# Patient Record
Sex: Female | Born: 1950
Health system: Southern US, Community
[De-identification: ages and names within clinical notes are randomized; demographics above are authoritative.]

## PROBLEM LIST (undated history)

## (undated) DIAGNOSIS — M1990 Unspecified osteoarthritis, unspecified site: Secondary | ICD-10-CM

## (undated) DIAGNOSIS — D649 Anemia, unspecified: Secondary | ICD-10-CM

## (undated) DIAGNOSIS — M5030 Other cervical disc degeneration, unspecified cervical region: Secondary | ICD-10-CM

## (undated) DIAGNOSIS — E119 Type 2 diabetes mellitus without complications: Secondary | ICD-10-CM

## (undated) DIAGNOSIS — Z87898 Personal history of other specified conditions: Secondary | ICD-10-CM

## (undated) DIAGNOSIS — Z9889 Other specified postprocedural states: Secondary | ICD-10-CM

## (undated) DIAGNOSIS — E669 Obesity, unspecified: Secondary | ICD-10-CM

## (undated) DIAGNOSIS — Z9289 Personal history of other medical treatment: Secondary | ICD-10-CM

## (undated) DIAGNOSIS — J309 Allergic rhinitis, unspecified: Secondary | ICD-10-CM

## (undated) DIAGNOSIS — K317 Polyp of stomach and duodenum: Secondary | ICD-10-CM

## (undated) DIAGNOSIS — K589 Irritable bowel syndrome without diarrhea: Secondary | ICD-10-CM

## (undated) DIAGNOSIS — E785 Hyperlipidemia, unspecified: Secondary | ICD-10-CM

## (undated) DIAGNOSIS — Z780 Asymptomatic menopausal state: Secondary | ICD-10-CM

## (undated) DIAGNOSIS — M4820 Kissing spine, site unspecified: Secondary | ICD-10-CM

## (undated) DIAGNOSIS — R011 Cardiac murmur, unspecified: Secondary | ICD-10-CM

## (undated) DIAGNOSIS — A491 Streptococcal infection, unspecified site: Secondary | ICD-10-CM

## (undated) DIAGNOSIS — R197 Diarrhea, unspecified: Secondary | ICD-10-CM

## (undated) DIAGNOSIS — I10 Essential (primary) hypertension: Secondary | ICD-10-CM

## (undated) DIAGNOSIS — E039 Hypothyroidism, unspecified: Secondary | ICD-10-CM

## (undated) DIAGNOSIS — R112 Nausea with vomiting, unspecified: Secondary | ICD-10-CM

## (undated) HISTORY — DX: Polyp of stomach and duodenum: K31.7

## (undated) HISTORY — DX: Irritable bowel syndrome, unspecified: K58.9

## (undated) HISTORY — DX: Unspecified osteoarthritis, unspecified site: M19.90

## (undated) HISTORY — PX: COLONOSCOPY: SHX174

## (undated) HISTORY — DX: Essential (primary) hypertension: I10

## (undated) HISTORY — DX: Other cervical disc degeneration, unspecified cervical region: M50.30

## (undated) HISTORY — DX: Anemia, unspecified: D64.9

## (undated) HISTORY — DX: Asymptomatic menopausal state: Z78.0

## (undated) HISTORY — DX: Hypothyroidism, unspecified: E03.9

## (undated) HISTORY — DX: Type 2 diabetes mellitus without complications: E11.9

## (undated) HISTORY — PX: CHOLECYSTECTOMY: SHX55

## (undated) HISTORY — DX: Kissing spine, site unspecified: M48.20

## (undated) HISTORY — DX: Streptococcal infection, unspecified site: A49.1

## (undated) HISTORY — DX: Allergic rhinitis, unspecified: J30.9

## (undated) HISTORY — DX: Obesity, unspecified: E66.9

## (undated) HISTORY — DX: Hyperlipidemia, unspecified: E78.5

## (undated) HISTORY — DX: Personal history of other medical treatment: Z92.89

## (undated) HISTORY — DX: Diarrhea, unspecified: R19.7

## (undated) HISTORY — DX: Personal history of other specified conditions: Z87.898

---

## 1999-11-28 ENCOUNTER — Other Ambulatory Visit: Admission: RE | Admit: 1999-11-28 | Discharge: 1999-11-28 | Payer: Self-pay | Admitting: *Deleted

## 2000-12-24 ENCOUNTER — Other Ambulatory Visit: Admission: RE | Admit: 2000-12-24 | Discharge: 2000-12-24 | Payer: Self-pay | Admitting: Family Medicine

## 2002-01-04 ENCOUNTER — Other Ambulatory Visit: Admission: RE | Admit: 2002-01-04 | Discharge: 2002-01-04 | Payer: Self-pay | Admitting: Family Medicine

## 2002-07-15 ENCOUNTER — Encounter (INDEPENDENT_AMBULATORY_CARE_PROVIDER_SITE_OTHER): Payer: Self-pay | Admitting: Specialist

## 2002-07-15 ENCOUNTER — Ambulatory Visit (HOSPITAL_COMMUNITY): Admission: RE | Admit: 2002-07-15 | Discharge: 2002-07-15 | Payer: Self-pay | Admitting: Gastroenterology

## 2002-11-15 ENCOUNTER — Encounter: Admission: RE | Admit: 2002-11-15 | Discharge: 2002-11-15 | Payer: Self-pay | Admitting: Family Medicine

## 2003-03-28 ENCOUNTER — Encounter: Admission: RE | Admit: 2003-03-28 | Discharge: 2003-03-28 | Payer: Self-pay | Admitting: Family Medicine

## 2003-03-28 ENCOUNTER — Encounter: Payer: Self-pay | Admitting: Family Medicine

## 2004-02-28 ENCOUNTER — Other Ambulatory Visit: Admission: RE | Admit: 2004-02-28 | Discharge: 2004-02-28 | Payer: Self-pay | Admitting: Obstetrics and Gynecology

## 2004-10-25 ENCOUNTER — Ambulatory Visit (HOSPITAL_COMMUNITY): Admission: RE | Admit: 2004-10-25 | Discharge: 2004-10-25 | Payer: Self-pay | Admitting: Gastroenterology

## 2004-10-25 ENCOUNTER — Encounter (INDEPENDENT_AMBULATORY_CARE_PROVIDER_SITE_OTHER): Payer: Self-pay | Admitting: Specialist

## 2005-03-20 ENCOUNTER — Other Ambulatory Visit: Admission: RE | Admit: 2005-03-20 | Discharge: 2005-03-20 | Payer: Self-pay | Admitting: Family Medicine

## 2006-06-10 ENCOUNTER — Other Ambulatory Visit: Admission: RE | Admit: 2006-06-10 | Discharge: 2006-06-10 | Payer: Self-pay | Admitting: Family Medicine

## 2008-02-14 ENCOUNTER — Encounter: Admission: RE | Admit: 2008-02-14 | Discharge: 2008-02-14 | Payer: Self-pay | Admitting: Neurosurgery

## 2009-05-03 ENCOUNTER — Other Ambulatory Visit: Admission: RE | Admit: 2009-05-03 | Discharge: 2009-05-03 | Payer: Self-pay | Admitting: Family Medicine

## 2010-08-06 ENCOUNTER — Encounter: Admission: RE | Admit: 2010-08-06 | Discharge: 2010-08-06 | Payer: Self-pay | Admitting: Sports Medicine

## 2011-01-17 NOTE — Op Note (Signed)
Sheri [name], LANDOWSKI           ACCOUNT NO.:  [id number]   MEDICAL RECORD NO.:  [id number]          PATIENT TYPE:  AMB   LOCATION:  ENDO                         FACILITY:  [facility]   PHYSICIAN:  Bernette Redbird, M.D.   DATE OF BIRTH:  04-24-1951   DATE OF PROCEDURE:  10/25/2004  DATE OF DISCHARGE:                                 OPERATIVE REPORT   PROCEDURE:  Colonoscopy.   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 No risk factors or symptoms.   FINDINGS:  Normal exam to the cecum.   PROCEDURE:  The nature, purpose and risks of the procedure had been  discussed with the patient who provided written consent. Sedation was  fentanyl 125 mcg and Versed 12 mg IV without arrhythmias or desaturation.  The Olympus adjustable tension pediatric video colonoscope was advanced to  the cecum as identified by clear visualization of the appendiceal orifice  and the absence of further lumen. We did have to turn the patient into the  supine position and apply some external abdominal compression to reach the  cecum. Pullback was then performed. The quality of prep was very good and it  is felt that all areas were well seen.   This was a normal examination. No polyps, cancer, colitis, vascular  malformations or diverticulosis were noted and retroflexion in the rectum as  well as reinspection of the rectum were unremarkable. No biopsies were  obtained. The patient tolerated the procedure well and there were no  apparent complications.   IMPRESSION:  Normal screening colonoscopy in a standard risk individual  (V76.51).   PLAN:  Consider flexible sigmoidoscopy in 5 years for continued screening.      RB/MEDQ  D:  10/25/2004  T:  10/25/2004  Job:  [id number]   cc:   Dario Guardian, M.D.  [address]  [place]  [place] [address]  Fax: [phone]

## 2011-01-17 NOTE — Op Note (Signed)
   NAME:  Sheri [name], Sheri [name]                     ACCOUNT NO.:  [id number]   MEDICAL RECORD NO.:  [id number]                   PATIENT TYPE:  AMB   LOCATION:  ENDO                                 [facility]ACILITY[facility]  [facility]   PHYSICIAN:  Bernette Redbird, M.D.                DATE OF BIRTH:  [birth year]   DATE OF PROCEDURE:  07/15/2002  DATE OF DISCHARGE:                                 OPERATIVE REPORT   PROCEDURE:  Upper endoscopy with biopsy.   FINDINGS:  Essentially normal exam.   DESCRIPTION OF PROCEDURE:  The nature, purpose, and risks of the procedure  had been discussed with the patient who provided written consent.  Sedation  was fentanyl 100 mcg and Versed 8 mg IV without arrhythmias or desaturation.  The Olympus video endoscope was passed under direct vision.  The vocal cords  looked normal.  The esophagus was readily entered and had entirely normal  mucosa without evidence of free reflux, reflux esophagitis, varices,  infection, or neoplasia.  There was a spontaneously-reducing or intermittent  1 cm hiatal hernia, but I did not see evidence of Barrett's esophagus or any  ring or stricture.   The stomach contained no significant residual and had normal mucosa,  including a retroflexed view of the cardia and fundus.  Specifically, no  gastritis, erosions, ulcers, polyps, or masses were seen.   The pylorus, duodenal bulb, and the second duodenum looked normal.   The scope was removed after obtaining some random duodenal biopsies.  The  patient tolerated this procedure well, and there were no apparent  complications.   IMPRESSION:  Normal endoscopy.  No source of abdominal pain evident.   PLAN:  1. Continue high dose Protonix which, as of today, has been associated with     some improvement in the abdominal pain.  2. Follow-up in the office in the near future.                                               Bernette Redbird, M.D.    RB/MEDQ  D:  07/15/2002  T:  07/15/2002  Job:   [id number]   cc:   Dario Guardian, M.D.

## 2011-01-17 NOTE — Op Note (Signed)
[name], Sheri [name]           ACCOUNT NO.:  [id number]   MEDICAL RECORD NO.:  [id number]          PATIENT TYPE:  AMB   LOCATION:  ENDO                         FACILITY:  [facility]   PHYSICIAN:  Bernette Redbird, M.D.   DATE OF BIRTH:  07-23-1951   DATE OF PROCEDURE:  10/25/2004  DATE OF DISCHARGE:                                 OPERATIVE REPORT   PROCEDURE:  Colonoscopy with biopsy (addendum).   This is an addendum to previously-dictated  procedure report on this patient  from today. In that report, I neglected to mention that there was a small  sessile polyp in the left colon removed by cold biopsy technique and sent  for histologic analysis.   PLAN:  Await pathology results with follow-up colonoscopy in 5 years if the  polyp is adenomatous in character.      RB/MEDQ  D:  10/25/2004  T:  10/25/2004  Job:  [id number]

## 2011-06-02 DIAGNOSIS — D649 Anemia, unspecified: Secondary | ICD-10-CM

## 2011-06-02 HISTORY — DX: Anemia, unspecified: D64.9

## 2011-06-25 ENCOUNTER — Encounter: Payer: Self-pay | Admitting: Oncology

## 2011-07-29 ENCOUNTER — Other Ambulatory Visit: Payer: Self-pay | Admitting: Lab

## 2011-08-12 ENCOUNTER — Ambulatory Visit: Payer: Self-pay

## 2011-08-13 ENCOUNTER — Ambulatory Visit (HOSPITAL_BASED_OUTPATIENT_CLINIC_OR_DEPARTMENT_OTHER): Payer: PRIVATE HEALTH INSURANCE | Admitting: Oncology

## 2011-08-13 ENCOUNTER — Ambulatory Visit: Payer: PRIVATE HEALTH INSURANCE

## 2011-08-13 ENCOUNTER — Telehealth: Payer: Self-pay | Admitting: *Deleted

## 2011-08-13 ENCOUNTER — Other Ambulatory Visit (HOSPITAL_BASED_OUTPATIENT_CLINIC_OR_DEPARTMENT_OTHER): Payer: PRIVATE HEALTH INSURANCE | Admitting: Lab

## 2011-08-13 VITALS — BP 152/85 | HR 83 | Temp 98.1°F | Ht 64.0 in | Wt 214.8 lb

## 2011-08-13 DIAGNOSIS — K2970 Gastritis, unspecified, without bleeding: Secondary | ICD-10-CM

## 2011-08-13 DIAGNOSIS — D649 Anemia, unspecified: Secondary | ICD-10-CM

## 2011-08-13 DIAGNOSIS — D509 Iron deficiency anemia, unspecified: Secondary | ICD-10-CM

## 2011-08-13 DIAGNOSIS — M25569 Pain in unspecified knee: Secondary | ICD-10-CM

## 2011-08-13 DIAGNOSIS — R5381 Other malaise: Secondary | ICD-10-CM

## 2011-08-13 NOTE — Telephone Encounter (Signed)
gave patient  appointment for 09-2011 and 10-2011 printed out calendar and gave to the patient 

## 2011-08-13 NOTE — Progress Notes (Signed)
CC: Sheri [name]    HPI: Sheri [name] is a 60 year old [place] woman referred by Sheri [name] for evaluation of iron deficiency anemia.  The patient has felt unusually tired for about a year and a half. She does have a history of hypothyroidism, and that was evaluated and found not to be the cause. The patient's daughter was recently seen in [place] by the hematologist there, Sheri [name], and received iron dextran. That made a big difference to her daughter's functional status. Sheri [name] wonders whether she might receive an equal benefit. At the same time Sheri [name] is concerned that if there is iron deficiency, in this postmenopausal woman, and that would require evaluation for occult bleeding. The patient is here for evaluation and treatment of these issues.  Past medical history:    The past medical history is significant for hyperlipidemia hypertension hypothyroidism diabetes mellitus GERD significant cervical degenerative disc disease significant osteoarthritis with emergent need for a right knee replacement, obesity, history of nephrolithiasis, and history of her irritable bowel syndrome.  Past surgical history:     Status post cholecystectomy, status post C-section x1  Family history:    The patient's father died at the age of 34 from complications of lung cancer. The patient's mother is alive at age 60. The patient has one brother and one sister living one brother who died, none from cancer related causes.  GYN history:  GX, P2, first pregnancy to term age 9. She went through menopause age 34 and took hormone replacement approximately 8 years.  Social history:  Her husband Sheri [name] of course is a dentist locally. 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 The daughters are Sheri [name], 32, who lives in [place] and has 3 children, and Sheri [name], [place], who lives in [place], and has two dogs and 3 cats. The patient attends Sheri [name]. Sheri [name].  Health maintenance: She tells me her most recent  colonoscopy was about 5 years ago. Incidentally she did call her GI physician, Sheri [name], and he did not feel her hot numbers warranted repeat colonoscopy at this time. The patient is a nonsmoker, nondrinker, and she tells me she had a normal bone density 3-4 years ago. Her most recent cholesterol level was checked by Sheri [name] in September of this year and it was 212 with an HDL of 76. The ratio is 2.81.  Allergies: The patient is reportedly allergic to purpura, Prozac, sulfa drugs, Crestor, and Niaspan.  Current outpatient prescriptions:amLODipine-benazepril (LOTREL) 5-20 MG per capsule, Take 1 capsule by mouth daily.  , Disp: , Rfl: ;  calcium citrate (CALCITRATE - DOSED IN MG ELEMENTAL CALCIUM) 950 MG tablet, Take 1,200 mg by mouth daily. Extended release , Disp: , Rfl: ;  cholecalciferol (VITAMIN D) 1000 UNITS tablet, Take 2,000 Units by mouth daily.  , Disp: , Rfl:  Glucosamine HCl-MSM 1500-500 MG/30ML LIQD, Take 1 tablet by mouth daily.  , Disp: , Rfl: ;  levothyroxine (SYNTHROID, LEVOTHROID) 125 MCG tablet, Take 125 mcg by mouth daily.  , Disp: , Rfl: ;  meloxicam (MOBIC) 15 MG tablet, Take 15 mg by mouth daily.  , Disp: , Rfl: ;  metFORMIN (GLUMETZA) 500 MG (MOD) 24 hr tablet, Take 500 mg by mouth 2 (two) times daily.  , Disp: , Rfl:  niacin (NIASPAN) 1000 MG CR tablet, Take 1,000 mg by mouth as needed.  , Disp: , Rfl: ;  OVER THE COUNTER MEDICATION, Take 81 mg by mouth daily. Asprin 81mg  , Disp: , Rfl: ;  pantoprazole (PROTONIX) 40 MG tablet, Take 40 mg by mouth as needed.  , Disp: , Rfl: ;  venlafaxine (EFFEXOR) 37.5 MG tablet, Take 37.5 mg by mouth daily.  , Disp: , Rfl:   ROS aside from fatigue and joint pains, the patient's review of systems is quite benign. She does have occasional paresthesias involving the arms. This is followed by Sheri [name], who is aware of her cervical degenerative disc disease. This is not progressive currently. She has some GERD symptoms. She is taking iron tablets  if you days a week, and this does upset her stomach. She sleeps well, with nocturia x1 only. She denies depression. She is not exercising at all at present because of the knee issue. A detailed review of systems was otherwise noncontributory.  Physical Exam  Blood pressure 152/85, pulse 83, temperature 98.1 F (36.7 C), temperature source Oral, height 5\' 4"  (1.626 m), weight 214 lb 12.8 oz (97.433 kg).  Sclerae unicteric Oropharynx clear No peripheral adenopathy Lungs no rales or rhonchi Heart regular rate and rhythm Abd benign, obese MSK no focal spinal tenderness Neuro: nonfocal   LABS   Lab work obtained by Sheri [name] included a ferritin of 22.2 in November of 09, down to 13.7 currently. The transferrin was 389 the iron saturation was low at 14 and iron binding capacity was elevated at 545. The platelet count was elevated at 410, up from 374 September of 2010. Hemoglobin was 11.9, down from 12.5 May of 2010. Other labs of interest include a TSH of 1.14, normal transaminases normal creatinine of 0.68, hemoglobin A1c of 6.0 and vitamin B 12 level of 542    Studies:    MRI OF THE RIGHT KNEE WITHOUT CONTRAST  Technique: Multiplanar, multisequence MR imaging of the right knee  was performed. No intravenous contrast was administered.  Comparison: None.  Findings: There is a small knee joint effusion and a small Baker's  cyst. There is synovial irregularity. Tricompartmental  degenerative changes are present with advanced patellofemoral  chondromalacia and osteophytes. The extensor mechanism is intact.  In the medial compartment, there is mild degenerative chondrosis.  The medial meniscus and medial collateral ligament are intact.  Centrally, the cruciate ligaments are intact.  There is advanced lateral compartment degenerative chondrosis with  osteophytes and subchondral edema peripherally. There is extensive  degenerative tearing of the lateral meniscus which is extruded    laterally from the joint. There is a peripheral meniscal flap  fragment adjacent to the body of the lateral meniscus, most obvious  on the coronal images. The components of the lateral collateral  ligament complex are intact.  IMPRESSION:  1. Advanced lateral compartment degenerative chondrosis with  extensive degenerative tearing of the lateral meniscus. There is a  peripheral meniscal flap fragment.  2. Advanced patellofemoral degenerative changes.  3. No acute osteochondral findings.  4. Intact medial meniscus and knee ligaments.  Provider: Argie Ramming         Assessment: 60 year old [place] woman, with mild but measurable aren't deficiency as evidenced by a slight drop in her hemoglobin, iron saturation and ferritin, and a rise in her total iron binding capacity and platelet count. There is no evidence of response to oral iron supplementation, which she is tolerating only moderately well.    Plan:   I think there are 2 issues to discuss. The first is the aren't deficiency. I do think there is borderline iron deficiency present and I do think she will feel marginally better with iron  supplementation. We discussed this at length today and she will receive therapy seen on January 22. I expect that we will increase her hemoglobin above 12, increase her ferritin about 100, and provide her some slight increase in sense of well-being.  I believe the reason she is iron deficient is likely gastritis. She is taking nonsteroidal anti-inflammatory agents chronically. She is only taking Protonix intermittently. I don't think she is having peptic ulcer disease, as she gives me no symptoms suggestive of that diagnosis, and she had numerous guaiacs per Sheri [name] which she tells me were negative. I would not pursue an upper endoscopy study at this time, but did suggest to [facility] that she take her Protonix daily. Hopefully she will be able to go off the nonsteroidals when she has her knee  replacement  We then talked about causes of fatigue. She sleeps well, she denies depression, she is not significantly anemic and her TSH is in good shape. I really think the problem is deconditioning. It is very difficult for her to exercise because of her knee and other arthritis issues. She does have a stationary bike at home and I suggested she do at least 45 minutes a day at least 5 days a week on that. I also suggested for Christmas she get as a present a membership in a gym. This would allow her to also do water aerobics and other activities that she might find more stimulating. I think if she exercise on a regular basis for the next 6 weeks and created a habit of it she would have more energy.  We left it that she will return here on January 22 for a therapy and dose and return a few weeks later simply 4 of a hemoglobin, iron studies, and reticulocyte count. I am not scheduling her for a return appointment here, but will give her those results by phone. She will tell me at that time how she is doing in terms of the fatigue and the exercise recommendations.   Jiovanna Frei C 08/13/2011, 11:29 AM

## 2011-09-23 ENCOUNTER — Telehealth: Payer: Self-pay | Admitting: *Deleted

## 2011-09-23 ENCOUNTER — Ambulatory Visit (HOSPITAL_BASED_OUTPATIENT_CLINIC_OR_DEPARTMENT_OTHER): Payer: PRIVATE HEALTH INSURANCE

## 2011-09-23 ENCOUNTER — Telehealth: Payer: Self-pay | Admitting: Oncology

## 2011-09-23 VITALS — BP 128/78 | HR 69 | Temp 97.6°F

## 2011-09-23 DIAGNOSIS — D649 Anemia, unspecified: Secondary | ICD-10-CM

## 2011-09-23 MED ORDER — SODIUM CHLORIDE 0.9 % IV SOLN
Freq: Once | INTRAVENOUS | Status: AC
  Administered 2011-09-23: 10:00:00 via INTRAVENOUS

## 2011-09-23 MED ORDER — SODIUM CHLORIDE 0.9 % IV SOLN
1020.0000 mg | Freq: Once | INTRAVENOUS | Status: AC
  Administered 2011-09-23: 1020 mg via INTRAVENOUS
  Filled 2011-09-23: qty 34

## 2011-09-23 NOTE — Telephone Encounter (Signed)
Message copied by Augusto Garbe on Tue Sep 23, 2011 11:42 AM ------      Message from: Lorenza Evangelist A      Created: Tue Sep 23, 2011 11:02 AM      Regarding: Follow up call       First time [facility].  GM.             Thanks!!

## 2011-09-23 NOTE — Telephone Encounter (Signed)
Pt stopped by to change lab appt from 2/19 to 2/26.printed for pt    aom

## 2011-09-23 NOTE — Telephone Encounter (Signed)
Patient is doing well.  Denies any symptoms or side effects  Also denies any questions at thus time.  Encouraged to call if needed.

## 2011-09-24 ENCOUNTER — Telehealth: Payer: Self-pay | Admitting: *Deleted

## 2011-09-24 NOTE — Telephone Encounter (Signed)
Called pt to f/u after fereheme yest.  She reports that she is doing well other than some joint pain, especially in her knee that started yest eve.  She took tylenol & it did help.  She reports that her IV site is unremarkable.

## 2011-10-18 ENCOUNTER — Other Ambulatory Visit: Payer: Self-pay | Admitting: Oncology

## 2011-10-21 ENCOUNTER — Other Ambulatory Visit: Payer: PRIVATE HEALTH INSURANCE | Admitting: Lab

## 2011-10-28 ENCOUNTER — Other Ambulatory Visit (HOSPITAL_BASED_OUTPATIENT_CLINIC_OR_DEPARTMENT_OTHER): Payer: PRIVATE HEALTH INSURANCE | Admitting: Lab

## 2011-10-28 DIAGNOSIS — D509 Iron deficiency anemia, unspecified: Secondary | ICD-10-CM

## 2011-10-28 DIAGNOSIS — D649 Anemia, unspecified: Secondary | ICD-10-CM

## 2011-10-28 LAB — CBC & DIFF AND RETIC
BASO%: 0.5 % (ref 0.0–2.0)
Basophils Absolute: 0 10*3/uL (ref 0.0–0.1)
EOS%: 2.4 % (ref 0.0–7.0)
Eosinophils Absolute: 0.2 10*3/uL (ref 0.0–0.5)
HCT: 37.2 % (ref 34.8–46.6)
HGB: 12.3 g/dL (ref 11.6–15.9)
Immature Retic Fract: 2.1 % (ref 1.60–10.00)
LYMPH%: 23.7 % (ref 14.0–49.7)
MCH: 29.1 pg (ref 25.1–34.0)
MCHC: 33.1 g/dL (ref 31.5–36.0)
MCV: 87.9 fL (ref 79.5–101.0)
MONO#: 0.6 10*3/uL (ref 0.1–0.9)
MONO%: 7.2 % (ref 0.0–14.0)
NEUT#: 5.3 10*3/uL (ref 1.5–6.5)
NEUT%: 66.2 % (ref 38.4–76.8)
Platelets: 359 10*3/uL (ref 145–400)
RBC: 4.23 10*6/uL (ref 3.70–5.45)
RDW: 16.4 % — ABNORMAL HIGH (ref 11.2–14.5)
Retic %: 1.57 % (ref 0.70–2.10)
Retic Ct Abs: 66.41 10*3/uL (ref 33.70–90.70)
WBC: 8 10*3/uL (ref 3.9–10.3)
lymph#: 1.9 10*3/uL (ref 0.9–3.3)

## 2011-10-28 LAB — IRON AND TIBC
%SAT: 31 % (ref 20–55)
Iron: 93 ug/dL (ref 42–145)
TIBC: 301 ug/dL (ref 250–470)
UIBC: 208 ug/dL (ref 125–400)

## 2011-10-28 LAB — FERRITIN: Ferritin: 330 ng/mL — ABNORMAL HIGH (ref 10–291)

## 2011-11-04 ENCOUNTER — Telehealth: Payer: Self-pay | Admitting: Internal Medicine

## 2011-11-04 NOTE — Telephone Encounter (Addendum)
Requesting lab results from 2/26 and has not heard back from results- she did say she would be notified if there was a problem with her labs. I told her no one has called her . She requested I that I fax the results to Dr Merri Brunette and Dr Matthias Hughs. Faxed results per her request. Confirmation of receipt of labs received.

## 2011-11-26 ENCOUNTER — Encounter: Payer: Self-pay | Admitting: *Deleted

## 2011-11-26 NOTE — Progress Notes (Unsigned)
PT REPORTS THAT SHE IS TO HAVE A KNEE REPLACEMENT 01/06/12 WITH DR Zollie Beckers FRUEH IN [place], [place]. PROTOCOL PER DR. Georgina Snell, POST KNEE REPLACEMENT IS A 325 ASA IN THE MORNING AND IN THE EVENING.   PT IS CONCERNED ABOUT "BLOOD CLOTS" PT ALSO STATES THAT PER HER G.I.  DOCTOR SHE HAS A "SLOW BLEED" BUT LOCATION HAS NOT BEEN DETERMINED.  PT WOULD LIKE TO TAKE XARELTO POST SURGERY WITH DR Princella Pellegrini PERMISSION AND IS REQUESTING A "RECOMMENDATION BE FAXED TO DR Richardine Service.  FAX # [phone] PT CB# [phone]

## 2011-11-27 DIAGNOSIS — Z8261 Family history of arthritis: Secondary | ICD-10-CM | POA: Insufficient documentation

## 2011-11-27 DIAGNOSIS — E119 Type 2 diabetes mellitus without complications: Secondary | ICD-10-CM | POA: Insufficient documentation

## 2011-12-01 ENCOUNTER — Encounter: Payer: Self-pay | Admitting: *Deleted

## 2011-12-01 NOTE — Progress Notes (Unsigned)
PT REPORTS THAT SHE HAS SEEN ANDY CULBRETH, PA.(ORTHO)  WHO RECOMMENDS THAT PT TAKE 81MG  ASA UNTIL SURGERY THEN 325MG  POST SURGERY FOR PREVENTATIVE PE AND DVT. PT IS "OK" WITH THIS REGIMENT AND IS INQUIRING MD THOUGHTS. IF DR MAGRINAT IS IN ACCORDANCE WITH THIS PROTOCOL, A WRITTEN RECOMMENDATION IS REQUESTED TO BE FAXED TO ##[phone]  WILL GIVE TO MD UPON HIS RETURN

## 2011-12-08 ENCOUNTER — Encounter: Payer: Self-pay | Admitting: Oncology

## 2011-12-08 ENCOUNTER — Other Ambulatory Visit: Payer: Self-pay | Admitting: Oncology

## 2011-12-31 HISTORY — PX: JOINT REPLACEMENT: SHX530

## 2012-04-01 ENCOUNTER — Other Ambulatory Visit: Payer: Self-pay | Admitting: *Deleted

## 2012-04-01 DIAGNOSIS — R197 Diarrhea, unspecified: Secondary | ICD-10-CM

## 2012-04-01 HISTORY — DX: Diarrhea, unspecified: R19.7

## 2012-04-02 ENCOUNTER — Telehealth: Payer: Self-pay | Admitting: *Deleted

## 2012-04-02 NOTE — Telephone Encounter (Signed)
Patient confirmed over the phone the new date and time on 04-19-2012 starting at 12:30pm

## 2012-04-14 ENCOUNTER — Other Ambulatory Visit: Payer: Self-pay | Admitting: Gastroenterology

## 2012-04-14 ENCOUNTER — Telehealth: Payer: Self-pay | Admitting: *Deleted

## 2012-04-14 ENCOUNTER — Other Ambulatory Visit: Payer: Self-pay | Admitting: Oncology

## 2012-04-14 DIAGNOSIS — R5383 Other fatigue: Secondary | ICD-10-CM

## 2012-04-14 NOTE — Telephone Encounter (Signed)
moved patient lab appointment to 04-16-2012 per md's orders left voice message to inform the patient of the new date and time of the lab only appointment

## 2012-04-15 ENCOUNTER — Other Ambulatory Visit (HOSPITAL_BASED_OUTPATIENT_CLINIC_OR_DEPARTMENT_OTHER): Payer: PRIVATE HEALTH INSURANCE | Admitting: Lab

## 2012-04-15 DIAGNOSIS — R5381 Other malaise: Secondary | ICD-10-CM

## 2012-04-15 DIAGNOSIS — D509 Iron deficiency anemia, unspecified: Secondary | ICD-10-CM

## 2012-04-15 DIAGNOSIS — R5383 Other fatigue: Secondary | ICD-10-CM

## 2012-04-15 LAB — CBC & DIFF AND RETIC
BASO%: 0.5 % (ref 0.0–2.0)
Basophils Absolute: 0 10*3/uL (ref 0.0–0.1)
EOS%: 2.7 % (ref 0.0–7.0)
Eosinophils Absolute: 0.2 10*3/uL (ref 0.0–0.5)
HCT: 37.4 % (ref 34.8–46.6)
HGB: 12.6 g/dL (ref 11.6–15.9)
Immature Retic Fract: 1.8 % (ref 1.60–10.00)
LYMPH%: 26.7 % (ref 14.0–49.7)
MCH: 29.5 pg (ref 25.1–34.0)
MCHC: 33.7 g/dL (ref 31.5–36.0)
MCV: 87.6 fL (ref 79.5–101.0)
MONO#: 0.8 10*3/uL (ref 0.1–0.9)
MONO%: 10.8 % (ref 0.0–14.0)
NEUT#: 4.4 10*3/uL (ref 1.5–6.5)
NEUT%: 59.3 % (ref 38.4–76.8)
Platelets: 387 10*3/uL (ref 145–400)
RBC: 4.27 10*6/uL (ref 3.70–5.45)
RDW: 13.5 % (ref 11.2–14.5)
Retic %: 1.4 % (ref 0.70–2.10)
Retic Ct Abs: 59.78 10*3/uL (ref 33.70–90.70)
WBC: 7.5 10*3/uL (ref 3.9–10.3)
lymph#: 2 10*3/uL (ref 0.9–3.3)
nRBC: 0 % (ref 0–0)

## 2012-04-15 LAB — CHCC SMEAR

## 2012-04-16 ENCOUNTER — Other Ambulatory Visit: Payer: Self-pay | Admitting: *Deleted

## 2012-04-16 ENCOUNTER — Other Ambulatory Visit: Payer: PRIVATE HEALTH INSURANCE | Admitting: Lab

## 2012-04-16 ENCOUNTER — Telehealth: Payer: Self-pay | Admitting: *Deleted

## 2012-04-16 NOTE — Telephone Encounter (Signed)
This RN spoke with pt per her call to inquire about lab values. Sheri [name] is scheduled for MD follow up on 8/19 " but I am in [place] with my daughter who is expecting any day and if labs are appropriate I would like to reschedule appt with MD."  Labs reviewed with pt with Sheri [name] stating " those levels are good"  Per discussion plan at present is to reschedule appt to October ( note Sheri [name] states she would like to spend the month of Sept in [place] with her daughter ).  Return call number call [phone]

## 2012-04-19 ENCOUNTER — Telehealth: Payer: Self-pay | Admitting: *Deleted

## 2012-04-19 ENCOUNTER — Ambulatory Visit: Payer: PRIVATE HEALTH INSURANCE | Admitting: Oncology

## 2012-04-19 ENCOUNTER — Other Ambulatory Visit: Payer: PRIVATE HEALTH INSURANCE | Admitting: Lab

## 2012-04-19 NOTE — Telephone Encounter (Signed)
per orders from 04-16-2012

## 2012-04-23 LAB — ANA: Anti Nuclear Antibody(ANA): POSITIVE — AB

## 2012-04-23 LAB — SEDIMENTATION RATE: Sed Rate: 13 mm/hr (ref 0–22)

## 2012-04-23 LAB — FOLATE: Folate: >20 ng/mL

## 2012-04-23 LAB — VITAMIN B12 DEFICIENCY PANEL - CHCC

## 2012-04-23 LAB — TSH: TSH: 1.33 u[IU]/mL (ref 0.350–4.500)

## 2012-04-23 LAB — FERRITIN: Ferritin: 193 ng/mL (ref 10–291)

## 2012-04-23 LAB — IRON AND TIBC
%SAT: 22 % (ref 20–55)
Iron: 78 ug/dL (ref 42–145)
TIBC: 354 ug/dL (ref 250–470)
UIBC: 276 ug/dL (ref 125–400)

## 2012-04-23 LAB — ANTI-NUCLEAR AB-TITER (ANA TITER)

## 2012-04-26 ENCOUNTER — Other Ambulatory Visit: Payer: Self-pay | Admitting: Oncology

## 2012-06-07 ENCOUNTER — Other Ambulatory Visit: Payer: Self-pay | Admitting: *Deleted

## 2012-06-07 ENCOUNTER — Other Ambulatory Visit: Payer: PRIVATE HEALTH INSURANCE | Admitting: Lab

## 2012-06-07 DIAGNOSIS — D649 Anemia, unspecified: Secondary | ICD-10-CM

## 2012-06-14 ENCOUNTER — Ambulatory Visit (HOSPITAL_BASED_OUTPATIENT_CLINIC_OR_DEPARTMENT_OTHER): Payer: PRIVATE HEALTH INSURANCE | Admitting: Oncology

## 2012-06-14 ENCOUNTER — Telehealth: Payer: Self-pay | Admitting: Oncology

## 2012-06-14 ENCOUNTER — Other Ambulatory Visit: Payer: PRIVATE HEALTH INSURANCE | Admitting: Lab

## 2012-06-14 VITALS — BP 138/81 | HR 81 | Temp 98.5°F | Resp 20 | Ht 64.0 in | Wt 208.5 lb

## 2012-06-14 DIAGNOSIS — D649 Anemia, unspecified: Secondary | ICD-10-CM

## 2012-06-14 NOTE — Progress Notes (Signed)
CC: Sheri [name]    HPI:  The patient has felt unusually tired for about a year and a half. She does have a history of hypothyroidism, and that was evaluated and found not to be the cause. The patient's daughter was recently seen in [place] by the hematologist there, Sheri [name], and received iron dextran. That made a big difference to her daughter's functional status. Sheri [name] wonders whether she might receive an equal benefit. At the same time Dr. Katrinka [name] is concerned that if there is iron deficiency, in this postmenopausal woman, and that would require evaluation for occult bleeding. The patient was seen here December 2012 for evaluation and treatment of these issues.  Interval History: Sheri [name] returns [name] for followup of her iron deficiency anemia. Since her last visit here she had her colonoscopy under Dr. Matthias [name], and that was unremarkable. She also had her fourth grandchild, a granddaughter, 8 weeks ago. Finally she had her right knee replaced under Dr. Richardine [name] in [place]. She feels she is finally able to start walking a bit more normally.  ROS  Her right knee still hurts, although less. She does take Mobic every day. She continues to have significant diarrhea. She has been advised to take Imodium for this and she takes about 4 Imodium daily. There is occasional cramping with this. There has been no blood. Otherwise a detailed review of systems [name] was noncontributory  Past medical history:    The past medical history is significant for hyperlipidemia hypertension hypothyroidism diabetes mellitus GERD significant cervical degenerative disc disease significant osteoarthritis with emergent need for a right knee replacement, obesity, history of nephrolithiasis, and history of her irritable bowel syndrome.  Past surgical history:     Status post cholecystectomy, status post C-section x1  Family history:    The patient's father died at the age of 57 from complications of lung  cancer. The patient's mother is alive at age 8. The patient has one brother and one sister living one brother who died, none from cancer related causes.  GYN history:  GX, P2, first pregnancy to term age 61. She went through menopause age 64 and took hormone replacement approximately 8 years.  Social history:  Her husband Sheri [name] of course is a dentist locally. 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 The daughters are Sheri [name], 90, who lives in [place] and has 3 children, and Sheri [name], 30, who lives in [place], and has two dogs and 3 cats, and now also a daughter. The patient attends [name].  Health maintenance: She tells me her most recent colonoscopy was about 5 years ago. Incidentally she did call her GI physician, Dr. Matthias [name], and he did not feel her hot numbers warranted repeat colonoscopy at this time. The patient is a nonsmoker, nondrinker, and she tells me she had a normal bone density 3-4 years ago. Her most recent cholesterol level was checked by Dr. Katrinka [name] in September of this year and it was 212 with an HDL of 76. The ratio is 2.81.  Allergies: The patient is reportedly allergic to purpura, Prozac, sulfa drugs, Crestor, and Niaspan.  Current outpatient prescriptions:amLODipine-benazepril (LOTREL) 5-20 MG per capsule, Take 1 capsule by mouth daily.  , Disp: , Rfl: ;  levothyroxine (SYNTHROID, LEVOTHROID) 125 MCG tablet, Take 125 mcg by mouth daily.  , Disp: , Rfl: ;  meloxicam (MOBIC) 15 MG tablet, Take 15 mg by mouth daily.  , Disp: , Rfl: ;  metFORMIN (GLUMETZA) 500 MG (MOD) 24 hr tablet,  Take 500 mg by mouth 2 (two) times daily.  , Disp: , Rfl:  OVER THE COUNTER MEDICATION, Take 81 mg by mouth daily. Asprin 81mg  , Disp: , Rfl: ;  pantoprazole (PROTONIX) 40 MG tablet, Take 40 mg by mouth as needed.  , Disp: , Rfl: ;  calcium citrate (CALCITRATE - DOSED IN MG ELEMENTAL CALCIUM) 950 MG tablet, Take 1,200 mg by mouth daily. Extended release , Disp: , Rfl: ;  cholecalciferol (VITAMIN D)  1000 UNITS tablet, Take 2,000 Units by mouth daily.  , Disp: , Rfl:  Glucosamine HCl-MSM 1500-500 MG/30ML LIQD, Take 1 tablet by mouth daily.  , Disp: , Rfl: ;  niacin (NIASPAN) 1000 MG CR tablet, Take 1,000 mg by mouth as needed.  , Disp: , Rfl: ;  venlafaxine (EFFEXOR) 37.5 MG tablet, Take 37.5 mg by mouth daily.  , Disp: , Rfl:    Physical Exam  Blood pressure 138/81, pulse 81, temperature 98.5 F (36.9 C), resp. rate 20, height 5\' 4"  (1.626 m), weight 208 lb 8 oz (94.575 kg).  Sclerae unicteric Oropharynx clear No peripheral adenopathy Lungs no rales or rhonchi Heart regular rate and rhythm Abd benign, obese MSK no focal spinal tenderness; right knee shows no erythema, swelling, or unusual tenderness Neuro: nonfocal   LABS   Results for Sheri, CYPHERS (MRN [id number]) as of 06/14/2012 14:14  Ref. Range 04/15/2012 11:32  Iron Latest Range: 42-145 ug/dL 78  UIBC Latest Range: 125-400 ug/dL 578  TIBC Latest Range: 250-470 ug/dL 469  %SAT Latest Range: 20-55 % 22  Ferritin Latest Range: 10-291 ng/mL 193  Folate No range found >20.0  WBC Latest Range: 3.9-10.3 10e3/uL 7.5  RBC Latest Range: 3.70-5.45 10e6/uL 4.27  Hemoglobin Latest Range: 11.6-15.9 g/dL 62.9  HCT Latest Range: 34.8-46.6 % 37.4  MCV Latest Range: 79.5-101.0 fL 87.6  MCH Latest Range: 25.1-34.0 pg 29.5  MCHC Latest Range: 31.5-36.0 g/dL 52.8  RDW Latest Range: 11.2-14.5 % 13.5  Platelets Latest Range: 145-400 10e3/uL 387  NEUT% Latest Range: 38.4-76.8 % 59.3  LYMPH% Latest Range: 14.0-49.7 % 26.7  MONO% Latest Range: 0.0-14.0 % 10.8  EOS% Latest Range: 0.0-7.0 % 2.7  BASO% Latest Range: 0.0-2.0 % 0.5  NEUT# Latest Range: 1.5-6.5 10e3/uL 4.4  MONO# Latest Range: 0.1-0.9 10e3/uL 0.8  Eosinophils Absolute Latest Range: 0.0-0.5 10e3/uL 0.2  Basophils Absolute Latest Range: 0.0-0.1 10e3/uL 0.0  lymph# Latest Range: 0.9-3.3 10e3/uL 2.0  nRBC Latest Range: 0-0 % 0  Smear Result No range found Smear  Available  Retic % Latest Range: 0.70-2.10 % 1.40  Retic Ct Abs Latest Range: 33.70-90.70 10e3/uL 59.78  Immature Retic Fract Latest Range: 1.60-10.00 % 1.80  Sed Rate Latest Range: 0-22 mm/hr 13  TSH Latest Range: 0.350-4.500 uIU/mL 1.330  ANA Latest Range: NEGATIVE  POS (A)  ANA Pattern 1 No range found HOMOGENOUS (A)  ANA Titer 1 Latest Range: <1:40   1:80 (H)  Results received No range found 04/23/12  Vitamin B12 Deficiency Panel No range found REPORT      Studies:    No results found.          Assessment: 61 year old [place] woman, with mild but measurable iron deficiency as evidenced by a slight drop in her hemoglobin, iron saturation and ferritin, and a rise in her total iron binding capacity and platelet count; with evidence of response to oral iron supplementation, which she tolerated only moderately well; s/p feraheme January 2013 with an excellent and continuing response to  Plan:She is doing very well from my point of view, and at this point requires only followup. She will be seeing Dr. Katrinka [name] in February, so she will have lab work rechecked here in June, and then see [place] again next October. We will do a CBC and reticulocyte count only those visits. She knows to call for any problems that may develop before then.   MAGRINAT,GUSTAV C 06/14/2012, 2:15 PM

## 2012-06-14 NOTE — Telephone Encounter (Signed)
gve the pt her June,oct 2014 appt calendars

## 2012-06-26 ENCOUNTER — Other Ambulatory Visit: Payer: Self-pay | Admitting: Oncology

## 2012-08-01 DIAGNOSIS — Z9289 Personal history of other medical treatment: Secondary | ICD-10-CM

## 2012-08-01 HISTORY — DX: Personal history of other medical treatment: Z92.89

## 2012-08-10 ENCOUNTER — Other Ambulatory Visit: Payer: Self-pay | Admitting: Gastroenterology

## 2012-08-10 DIAGNOSIS — R109 Unspecified abdominal pain: Secondary | ICD-10-CM

## 2012-08-12 ENCOUNTER — Other Ambulatory Visit: Payer: PRIVATE HEALTH INSURANCE

## 2012-08-18 ENCOUNTER — Other Ambulatory Visit: Payer: Self-pay | Admitting: Gastroenterology

## 2012-08-20 ENCOUNTER — Ambulatory Visit
Admission: RE | Admit: 2012-08-20 | Discharge: 2012-08-20 | Disposition: A | Discharge status: Home / Self Care | Payer: PRIVATE HEALTH INSURANCE | Source: Ambulatory Visit | Attending: Gastroenterology | Admitting: Gastroenterology

## 2012-08-20 DIAGNOSIS — R109 Unspecified abdominal pain: Secondary | ICD-10-CM

## 2012-08-20 MED ORDER — IOHEXOL 300 MG/ML  SOLN
100.0000 mL | Freq: Once | INTRAMUSCULAR | Status: AC | PRN
  Administered 2012-08-20: 100 mL via INTRAVENOUS

## 2012-10-26 ENCOUNTER — Other Ambulatory Visit: Payer: Self-pay | Admitting: Physician Assistant

## 2012-10-26 ENCOUNTER — Other Ambulatory Visit (HOSPITAL_COMMUNITY)
Admission: RE | Admit: 2012-10-26 | Discharge: 2012-10-26 | Disposition: A | Discharge status: Home / Self Care | Payer: BC Managed Care – PPO | Source: Ambulatory Visit | Attending: Family Medicine | Admitting: Family Medicine

## 2012-10-26 DIAGNOSIS — Z0000 Encounter for general adult medical examination without abnormal findings: Secondary | ICD-10-CM | POA: Insufficient documentation

## 2013-02-15 ENCOUNTER — Other Ambulatory Visit (HOSPITAL_BASED_OUTPATIENT_CLINIC_OR_DEPARTMENT_OTHER): Payer: BC Managed Care – PPO | Admitting: Lab

## 2013-02-15 DIAGNOSIS — D649 Anemia, unspecified: Secondary | ICD-10-CM

## 2013-02-15 LAB — CBC & DIFF AND RETIC
BASO%: 0.5 % (ref 0.0–2.0)
Basophils Absolute: 0 10*3/uL (ref 0.0–0.1)
EOS%: 1.7 % (ref 0.0–7.0)
Eosinophils Absolute: 0.1 10*3/uL (ref 0.0–0.5)
HCT: 37.2 % (ref 34.8–46.6)
HGB: 12.2 g/dL (ref 11.6–15.9)
Immature Retic Fract: 2.8 % (ref 1.60–10.00)
LYMPH%: 33.3 % (ref 14.0–49.7)
MCH: 28.9 pg (ref 25.1–34.0)
MCHC: 32.8 g/dL (ref 31.5–36.0)
MCV: 88.2 fL (ref 79.5–101.0)
MONO#: 0.7 10*3/uL (ref 0.1–0.9)
MONO%: 8.9 % (ref 0.0–14.0)
NEUT#: 4.6 10*3/uL (ref 1.5–6.5)
NEUT%: 55.6 % (ref 38.4–76.8)
Platelets: 336 10*3/uL (ref 145–400)
RBC: 4.22 10*6/uL (ref 3.70–5.45)
RDW: 14.6 % — ABNORMAL HIGH (ref 11.2–14.5)
Retic %: 1.44 % (ref 0.70–2.10)
Retic Ct Abs: 60.77 10*3/uL (ref 33.70–90.70)
WBC: 8.3 10*3/uL (ref 3.9–10.3)
lymph#: 2.8 10*3/uL (ref 0.9–3.3)

## 2013-03-29 ENCOUNTER — Other Ambulatory Visit: Payer: Self-pay | Admitting: Neurosurgery

## 2013-03-29 DIAGNOSIS — M47817 Spondylosis without myelopathy or radiculopathy, lumbosacral region: Secondary | ICD-10-CM

## 2013-04-05 ENCOUNTER — Ambulatory Visit
Admission: RE | Admit: 2013-04-05 | Discharge: 2013-04-05 | Disposition: A | Discharge status: Home / Self Care | Payer: BC Managed Care – PPO | Source: Ambulatory Visit | Attending: Neurosurgery | Admitting: Neurosurgery

## 2013-04-05 DIAGNOSIS — M47817 Spondylosis without myelopathy or radiculopathy, lumbosacral region: Secondary | ICD-10-CM

## 2013-04-10 ENCOUNTER — Ambulatory Visit
Admission: RE | Admit: 2013-04-10 | Discharge: 2013-04-10 | Disposition: A | Discharge status: Home / Self Care | Payer: BC Managed Care – PPO | Source: Ambulatory Visit | Attending: Neurosurgery | Admitting: Neurosurgery

## 2013-06-08 ENCOUNTER — Other Ambulatory Visit (HOSPITAL_BASED_OUTPATIENT_CLINIC_OR_DEPARTMENT_OTHER): Payer: BC Managed Care – PPO | Admitting: Lab

## 2013-06-08 DIAGNOSIS — D649 Anemia, unspecified: Secondary | ICD-10-CM

## 2013-06-08 LAB — CBC & DIFF AND RETIC
BASO%: 0.4 % (ref 0.0–2.0)
Basophils Absolute: 0.1 10*3/uL (ref 0.0–0.1)
EOS%: 1 % (ref 0.0–7.0)
Eosinophils Absolute: 0.1 10*3/uL (ref 0.0–0.5)
HCT: 38.7 % (ref 34.8–46.6)
HGB: 12.7 g/dL (ref 11.6–15.9)
Immature Retic Fract: 2.4 % (ref 1.60–10.00)
LYMPH%: 23.2 % (ref 14.0–49.7)
MCH: 29.1 pg (ref 25.1–34.0)
MCHC: 32.8 g/dL (ref 31.5–36.0)
MCV: 88.6 fL (ref 79.5–101.0)
MONO#: 1 10*3/uL — ABNORMAL HIGH (ref 0.1–0.9)
MONO%: 8.4 % (ref 0.0–14.0)
NEUT#: 7.9 10*3/uL — ABNORMAL HIGH (ref 1.5–6.5)
NEUT%: 67 % (ref 38.4–76.8)
Platelets: 371 10*3/uL (ref 145–400)
RBC: 4.37 10*6/uL (ref 3.70–5.45)
RDW: 13.3 % (ref 11.2–14.5)
Retic %: 1.25 % (ref 0.70–2.10)
Retic Ct Abs: 54.63 10*3/uL (ref 33.70–90.70)
WBC: 11.7 10*3/uL — ABNORMAL HIGH (ref 3.9–10.3)
lymph#: 2.7 10*3/uL (ref 0.9–3.3)
nRBC: 0 % (ref 0–0)

## 2013-06-15 ENCOUNTER — Encounter: Payer: Self-pay | Admitting: Physician Assistant

## 2013-06-15 ENCOUNTER — Ambulatory Visit (HOSPITAL_BASED_OUTPATIENT_CLINIC_OR_DEPARTMENT_OTHER): Payer: BC Managed Care – PPO | Admitting: Physician Assistant

## 2013-06-15 VITALS — BP 145/87 | HR 72 | Temp 98.0°F | Resp 20 | Ht 64.0 in | Wt 210.1 lb

## 2013-06-15 DIAGNOSIS — D509 Iron deficiency anemia, unspecified: Secondary | ICD-10-CM

## 2013-06-15 DIAGNOSIS — D649 Anemia, unspecified: Secondary | ICD-10-CM

## 2013-06-15 NOTE — Progress Notes (Signed)
ID: Nonie Hoyer OB: 1951/07/22  MR#: [id number]  CSN#:[id number]  PCP: Allean Found, MD GYN:   SU:  OTHER MD:  CHIEF COMPLAINT:  Iron deficiency anemia  HISTORY OF PRESENT ILLNESS: The patient felt unusually tired for about a year and a half. She does have a history of hypothyroidism, and that was evaluated and found not to be the cause. The patient's daughter was recently seen in [place] by the hematologist there, Aliene Altes, and received iron dextran. That made a big difference to her daughter's functional status. Ms. Plaia wondered whether she might receive an equal benefit. At the same time Dr. Katrinka Blazing was concerned that there is iron deficiency, in this postmenopausal woman, and that would require evaluation for occult bleeding. The patient was seen here December 2012 for evaluation and treatment of these issues.  Further treatment history is detailed below.  INTERVAL HISTORY: Dennie Bible returns today for followup of her iron deficiency anemia. Interval history is notable for the fact that she was recently taken off metformin. Within 3 days she stopped having diarrhea, which had been a problem for approximately 5 years. They do think it was in fact associated with the metformin, and she was started on Januvia. Her blood sugars are well controlled, and she continues to be followed closely through Dr. Michaelle Copas office.  Pat's hemoglobin continues to be normal, and she tells me she "feels like a new person". Her energy level is good. Her biggest complaint today is associated with back pain and leg pain, both of which are being followed by Dr. Channing Mutters and were recently assessed with a spinal MRI.   REVIEW OF SYSTEMS: Dennie Bible has had no recent illnesses and denies any fevers, chills, or night sweats. She denies any abnormal bruising or bleeding. Her appetite is good and she's had no problems with nausea, and currently denies any diarrhea or constipation. She's having no problems with dysuria or  hematuria. She denies any increased cough, increased shortness of breath, chest pain, palpitations. She's had no abnormal headaches or dizziness.  A detailed review of systems is otherwise stable and noncontributory.   PAST MEDICAL HISTORY: History reviewed. No pertinent past medical history. The past medical history is significant for hyperlipidemia hypertension hypothyroidism diabetes mellitus GERD significant cervical degenerative disc disease significant osteoarthritis with emergent need for a right knee replacement, obesity, history of nephrolithiasis, and history of her irritable bowel syndrome.   PAST SURGICAL HISTORY: History reviewed. No pertinent past surgical history.   FAMILY HISTORY History reviewed. No pertinent family history. The patient's father died at the age of 55 from complications of lung cancer. The patient's mother is alive at age 23. The patient has one brother and one sister living one brother who died, none from cancer related causes.   GYNECOLOGIC HISTORY:  GX, P2, first pregnancy to term age 32. She went through menopause age 27 and took hormone replacement approximately 8 years.   SOCIAL HISTORY:  Her husband Ameyah Bangura of course is a dentist locally. 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 The daughters are [name], 17, who lives in [place] and has 3 children; and [name], 30, who lives in [place], and has two dogs and 3 cats, and now also a daughter. The patient attends [employer].    ADVANCED DIRECTIVES:    HEALTH MAINTENANCE: (Updated 06/15/2013) History  Substance Use Topics  . Smoking status: Never Smoker   . Smokeless tobacco: Never Used  . Alcohol Use: No     Colonoscopy: Dec 2013,  Dr. Matthias Hughs  PAP:  Bone density: "osteopenia" per pt report  Lipid panel:   No Known Allergies  Current Outpatient Prescriptions  Medication Sig Dispense Refill  . amLODipine-benazepril (LOTREL) 5-20 MG per capsule Take 1 capsule by mouth daily.         . calcium citrate (CALCITRATE - DOSED IN MG ELEMENTAL CALCIUM) 950 MG tablet Take 1,200 mg by mouth daily. Extended release       . cholecalciferol (VITAMIN D) 1000 UNITS tablet Take 2,000 Units by mouth daily.        . Glucosamine HCl-MSM 1500-500 MG/30ML LIQD Take 1 tablet by mouth daily.        Marland Kitchen levothyroxine (SYNTHROID, LEVOTHROID) 125 MCG tablet Take 125 mcg by mouth daily.        . meloxicam (MOBIC) 15 MG tablet Take 15 mg by mouth daily.        . niacin (NIASPAN) 1000 MG CR tablet Take 1,000 mg by mouth as needed.        Marland Kitchen OVER THE COUNTER MEDICATION Take 81 mg by mouth daily. Asprin 81mg        . pantoprazole (PROTONIX) 40 MG tablet Take 40 mg by mouth as needed.        . sitaGLIPtin (JANUVIA) 25 MG tablet Take 25 mg by mouth daily.      Marland Kitchen venlafaxine (EFFEXOR) 37.5 MG tablet Take 37.5 mg by mouth daily.         No current facility-administered medications for this visit.    OBJECTIVE: Middle-aged white female who ambulates with a cane and appears comfortable, in no acute distress Filed Vitals:   06/15/13 1410  BP: 145/87  Pulse: 72  Temp: 98 F (36.7 C)  Resp: 20     Body mass index is 36.05 kg/(m^2).    ECOG FS: 1 Filed Weights   06/15/13 1410  Weight: 210 lb 1.6 oz (95.301 kg)   Ocular: Sclerae unicteric, conjunctiva pink Ear-nose-throat: Oropharynx clear, dentition fair, mucosa pink and moist Lymphatic: No cervical or supraclavicular adenopathy Lungs no wheezes, rales or rhonchi, good excursion bilaterally Heart regular rate and rhythm, no murmur appreciated Abd soft, obese, nontender, positive bowel sounds MSK no focal spinal tenderness Neuro: non-focal, well-oriented, appropriate affect Breasts: Deferred.   LAB RESULTS:   Lab Results  Component Value Date   WBC 11.7* 06/08/2013   NEUTROABS 7.9* 06/08/2013   HGB 12.7 06/08/2013   HCT 38.7 06/08/2013   MCV 88.6 06/08/2013   PLT 371 06/08/2013  RETIC %  1.25 RETIC CT ABS 54.63 IMMATURE  RETIC  FRACT 2.40   STUDIES: No results found.   ASSESSMENT: 62 y.o. [place] woman,   (1)  with mild but measurable iron deficiency as evidenced by a slight drop in her hemoglobin, iron saturation and ferritin, and a rise in her total iron binding capacity and platelet count;   (2) evidence of response to oral iron supplementation, which she tolerated only moderately well;   (3)  s/p feraheme infusion January 2013 with an excellent and continuing response    PLAN: Marykathryn's iron deficiency anemia appears to be well under control. She continues to be followed on a regular basis through Dr. Michaelle Copas office, and at this time she would like to be released from routine followup here. She understands that we would be glad to see her at any time, and she will call with any problems or questions. Otherwise, we release her back to Dr. Merri Brunette.  I will note that  Parrish is scheduled to see Dr. Katrinka Blazing in 2 weeks. I am forwarding to Dr. Katrinka Blazing a copy of our office note as well as Tiawana's labs from today, and I would recommend that she followup on the elevated WBC and ANC Kiriana's next visit. Of course I will leave this to Dr. Michaelle Copas discretion.  Avanish Cerullo, PA-C   06/15/2013 2:48 PM

## 2013-07-07 ENCOUNTER — Other Ambulatory Visit: Payer: Self-pay

## 2013-11-10 ENCOUNTER — Encounter: Payer: Self-pay | Admitting: General Surgery

## 2013-11-10 DIAGNOSIS — I10 Essential (primary) hypertension: Secondary | ICD-10-CM

## 2013-11-10 DIAGNOSIS — E7800 Pure hypercholesterolemia, unspecified: Secondary | ICD-10-CM | POA: Insufficient documentation

## 2013-11-16 ENCOUNTER — Encounter: Payer: Self-pay | Admitting: Cardiology

## 2013-11-16 ENCOUNTER — Ambulatory Visit (INDEPENDENT_AMBULATORY_CARE_PROVIDER_SITE_OTHER): Payer: BC Managed Care – PPO | Admitting: Cardiology

## 2013-11-16 VITALS — BP 120/64 | HR 74 | Ht 64.0 in | Wt 211.0 lb

## 2013-11-16 DIAGNOSIS — I10 Essential (primary) hypertension: Secondary | ICD-10-CM

## 2013-11-16 DIAGNOSIS — E7800 Pure hypercholesterolemia, unspecified: Secondary | ICD-10-CM

## 2013-11-16 LAB — LIPID PANEL
Cholesterol: 225 mg/dL — ABNORMAL HIGH (ref 0–200)
HDL: 64.1 mg/dL (ref >39.00)
LDL Cholesterol: 122 mg/dL — ABNORMAL HIGH (ref 0–99)
Total CHOL/HDL Ratio: 4
Triglycerides: 193 mg/dL — ABNORMAL HIGH (ref 0.0–149.0)
VLDL: 38.6 mg/dL (ref 0.0–40.0)

## 2013-11-16 LAB — ALT: ALT: 23 U/L (ref 0–35)

## 2013-11-16 NOTE — Patient Instructions (Signed)
Your physician recommends that you continue on your current medications as directed. Please refer to the Current Medication list given to you today.  Your physician recommends that you go to the lab today for a LIPID and ALT  Your physician wants you to follow-up in: 1 Year with Dr Mallie Snooks will receive a reminder letter in the mail two months in advance. If you don't receive a letter, please call our office to schedule the follow-up appointment.

## 2013-11-16 NOTE — Progress Notes (Addendum)
[place], [place], [place]  [address] Phone: [phone] Fax:  [phone]  Date:  11/16/2013   ID:  Sheri [name], DOB 23-Apr-1951, MRN [id number]  PCP:  [name], MD  Cardiologist:  Fransico Him, MD     History of Present Illness: Sheri [name] is a 63 y.o. female with a history of HTN and dyslipidemia presents back today for followup.  She is doing well.  She denies any chest pain, SOB, DOE, LE edema, dizziness, palpitations or syncope.  Her exercise is limited by her orthopedic problems.   Wt Readings from Last 3 Encounters:  11/16/13 211 lb (95.709 kg)  06/15/13 210 lb 1.6 oz (95.301 kg)  06/14/12 208 lb 8 oz (94.575 kg)     Past Medical History  Diagnosis Date  . Depression   . Obesity   . Hypothyroidism   . Kidney stone   . IBS (irritable bowel syndrome)   . Degenerative disc disease, cervical   . Kissing, osteophytes     Dr Carloyn Manner  . Esophageal reflux   . Allergic rhinitis   . Hyperlipidemia   . Menopause   . Osteoarthritis   . Mild anemia 06/2011    14% iron sat,ferritin 14  . Hyperplastic polyps of stomach   . H/O ulcer disease   . Diabetes mellitus without complication     type two  . Diarrhea 04/2012    colon nl, BX NPD; no response to cholestyramin 03-2012 no response to align; transient resp to sucralfate 12-13  . H/O CT scan of abdomen 08/2012    neg; egd bile reflux gastritisand linear eros; transient resp to sucralfate   . Hypertension     Current Outpatient Prescriptions  Medication Sig Dispense Refill  . amLODipine-benazepril (LOTREL) 5-20 MG per capsule Take 1 capsule by mouth daily.        . calcium citrate (CALCITRATE - DOSED IN MG ELEMENTAL CALCIUM) 950 MG tablet Take 1,200 mg by mouth daily. Extended release       . cholecalciferol (VITAMIN D) 1000 UNITS tablet Take 2,000 Units by mouth daily.        . Glucosamine HCl-MSM 1500-500 MG/30ML LIQD Take 1 tablet by mouth daily.        Marland Kitchen levothyroxine  (SYNTHROID, LEVOTHROID) 125 MCG tablet Take 125 mcg by mouth daily.        . meloxicam (MOBIC) 15 MG tablet Take 15 mg by mouth daily.        . niacin (NIASPAN) 1000 MG CR tablet Take 1,000 mg by mouth as needed.        Marland Kitchen OVER THE COUNTER MEDICATION Take 81 mg by mouth daily. Asprin 55m       . pantoprazole (PROTONIX) 40 MG tablet Take 40 mg by mouth as needed.        . sitaGLIPtin (JANUVIA) 25 MG tablet Take 25 mg by mouth daily.      .Marland Kitchenvenlafaxine (EFFEXOR) 37.5 MG tablet Take 37.5 mg by mouth daily.         No current facility-administered medications for this visit.    Allergies:    Allergies  Allergen Reactions  . Crestor [Rosuvastatin]     Myalgias   . Niaspan [Niacin Er]     Myalgia   . Provera [Medroxyprogesterone Acetate]     "makes pt crazy"    Social History:  The patient  reports that she has never smoked. She has never used smokeless tobacco. She  reports that she does not drink alcohol or use illicit drugs.   Family History:  The patient's family history is not on file.   ROS:  Please see the history of present illness.      All other systems reviewed and negative.   PHYSICAL EXAM: VS:  Ht '5\' 4"'  (1.626 m)  Wt 211 lb (95.709 kg)  BMI 36.20 kg/m2 Well nourished, well developed, in no acute distress HEENT: normal Neck: no JVD Cardiac:  normal S1, S2; RRR; no murmur Lungs:  clear to auscultation bilaterally, no wheezing, rhonchi or rales Abd: soft, nontender, no hepatomegaly Ext: no edema Skin: warm and dry Neuro:  CNs 2-12 intact, no focal abnormalities noted    EKG:  NSR   ASSESSMENT AND PLAN:  1.  HTN well controlled - continue Lotrel 2.  Dyslipidemia - will check fasting lipid and ALT today  Followup with me in 1 year  Signed, Fransico Him, MD 11/16/2013 8:46 AM

## 2013-11-29 ENCOUNTER — Encounter: Payer: BC Managed Care – PPO | Admitting: Pharmacist

## 2013-11-29 ENCOUNTER — Ambulatory Visit: Payer: BC Managed Care – PPO | Admitting: Pharmacist

## 2013-12-13 ENCOUNTER — Ambulatory Visit (INDEPENDENT_AMBULATORY_CARE_PROVIDER_SITE_OTHER): Payer: BC Managed Care – PPO | Admitting: Pharmacist

## 2013-12-13 VITALS — Wt 216.0 lb

## 2013-12-13 DIAGNOSIS — Z79899 Other long term (current) drug therapy: Secondary | ICD-10-CM

## 2013-12-13 DIAGNOSIS — E7800 Pure hypercholesterolemia, unspecified: Secondary | ICD-10-CM

## 2013-12-13 MED ORDER — PITAVASTATIN CALCIUM 2 MG PO TABS: 1.0000 mg | ORAL_TABLET | Freq: Every day | ORAL | Status: DC

## 2013-12-13 NOTE — Assessment & Plan Note (Signed)
Will restart patient on livalo 1 mg qd as she tolerated this in the past, and it is felt the diarrhea she was having at that time was due to the metformin, which she is no longer on.  Patient actually states she gets some constipation and would like to start Metamucil once daily if possible.  Will start slow with Livalo given her h/o intolerances, then increase to daily next month.  She is not a candidate for PCSK-9 inhibitor study at this time.  Recheck labs in 2-3 months. Plan: 1.  Start Livalo 1 mg - take 1 mg every other day for first month, then increase to 1 mg daily. 2.  Continue Vitamin D 2,000 units daily. 3.  If muscle aches occur after increasing to 1 mg everyday, then reduce back to every other day. 4.  Okay to use metamucil once daily (1 tablespoon mixed in 8 ounces of water, or 2 pills). 5.  Recheck cholesterol 2-3 months (02/16/14 - anytime after 7:30 am); see Ysidro Evert the next week on 02/21/14 at 2:30 pm

## 2013-12-13 NOTE — Patient Instructions (Addendum)
1.  Start Livalo 1 mg - take 1 mg every other day for first month, then increase to 1 mg daily (samples given to start) 2.  Continue Vitamin D 2,000 units daily. 3.  If muscle aches occur after increasing to 1 mg everyday, then reduce back to every other day. 4.  Okay to use metamucil once daily (1 tablespoon mixed in 8 ounces of water, or 2 pills). 5.  Recheck cholesterol 2-3 months (02/16/14 - anytime after 7:30 am); see Ysidro Evert the next week on 02/21/14 at 2:30 pm

## 2013-12-13 NOTE — Progress Notes (Signed)
Patient is a 62 y.o. WF referred to lipid clinic by [name] given multiple intolerances and h/o diabetes.  Patient has failed Lipitor, Crestor, Zetia, and simvastatin due to muscle aches.  Failed Welchol due to GI upset.  She took livalo 1 mg in past but stopped as she thought it was giving her diarrhea.  She stopped metformin around that time and the diarrhea resolved.  She has not retried Livalo since.  She is awaiting back surgery, but has had to postpone this due to her mother's health - she is currently in hospice.  Patient can't exercise currently.  Her A1C is well controlled (< 6.0), and she is trying to eat a reasonable diet, but living on the road carrying for her mother makes it difficult.  Her last lab showed LDL of 122 mg/dL in 10/2013.  Last Vitamin D was normal (52) in 03/2013.    RF:  Diabetes, HTN, age - LDL goal preferably < 70, non-HDL goal < 100 Meds:  Not on lipid lowering meds currently. Intolerant:  Lipitor, Zocor, Crestor, Zetia (muscle aches); Welchol (GI upset)  Labs:   10/2013:  TC 225, TG 193, LDL 122, HDL 64, ALT normal (not on lipid lowering meds)  Current Outpatient Prescriptions  Medication Sig Dispense Refill  . amLODipine-benazepril (LOTREL) 5-20 MG per capsule Take 1 capsule by mouth daily.        . calcium citrate (CALCITRATE - DOSED IN MG ELEMENTAL CALCIUM) 950 MG tablet Take 1,200 mg by mouth daily. Extended release       . cholecalciferol (VITAMIN D) 1000 UNITS tablet Take 2,000 Units by mouth daily.        . Glucosamine HCl-MSM 1500-500 MG/30ML LIQD Take 1 tablet by mouth daily.        Marland Kitchen levothyroxine (SYNTHROID, LEVOTHROID) 125 MCG tablet Take 125 mcg by mouth daily.        . meloxicam (MOBIC) 15 MG tablet Take 15 mg by mouth daily.        Marland Kitchen OVER THE COUNTER MEDICATION Take 81 mg by mouth daily. Asprin 81mg        . pantoprazole (PROTONIX) 40 MG tablet Take 40 mg by mouth as needed.        . sitaGLIPtin (JANUVIA) 25 MG tablet Take 25 mg by mouth daily.        No current facility-administered medications for this visit.   Allergies  Allergen Reactions  . Crestor [Rosuvastatin]     Myalgias   . Niaspan [Niacin Er]     Myalgia   . Provera [Medroxyprogesterone Acetate]     "makes pt crazy"

## 2014-02-16 ENCOUNTER — Other Ambulatory Visit: Payer: BC Managed Care – PPO

## 2014-02-21 ENCOUNTER — Ambulatory Visit: Payer: BC Managed Care – PPO | Admitting: Pharmacist

## 2014-06-28 ENCOUNTER — Other Ambulatory Visit: Payer: Self-pay | Admitting: Nurse Practitioner

## 2014-07-03 ENCOUNTER — Other Ambulatory Visit: Payer: BC Managed Care – PPO

## 2014-07-10 ENCOUNTER — Ambulatory Visit: Payer: BC Managed Care – PPO | Admitting: Pharmacist

## 2014-09-04 DIAGNOSIS — Z1389 Encounter for screening for other disorder: Secondary | ICD-10-CM | POA: Diagnosis not present

## 2014-09-04 DIAGNOSIS — E039 Hypothyroidism, unspecified: Secondary | ICD-10-CM | POA: Diagnosis not present

## 2014-09-04 DIAGNOSIS — E119 Type 2 diabetes mellitus without complications: Secondary | ICD-10-CM | POA: Diagnosis not present

## 2014-09-04 DIAGNOSIS — K219 Gastro-esophageal reflux disease without esophagitis: Secondary | ICD-10-CM | POA: Diagnosis not present

## 2014-09-04 DIAGNOSIS — I10 Essential (primary) hypertension: Secondary | ICD-10-CM | POA: Diagnosis not present

## 2014-09-04 DIAGNOSIS — Z0000 Encounter for general adult medical examination without abnormal findings: Secondary | ICD-10-CM | POA: Diagnosis not present

## 2014-09-05 DIAGNOSIS — M1712 Unilateral primary osteoarthritis, left knee: Secondary | ICD-10-CM | POA: Diagnosis not present

## 2014-09-05 DIAGNOSIS — M25562 Pain in left knee: Secondary | ICD-10-CM | POA: Diagnosis not present

## 2014-09-08 ENCOUNTER — Encounter: Payer: Self-pay | Admitting: Cardiology

## 2014-09-23 DIAGNOSIS — R300 Dysuria: Secondary | ICD-10-CM | POA: Diagnosis not present

## 2014-10-12 ENCOUNTER — Ambulatory Visit: Payer: Self-pay | Admitting: Orthopedic Surgery

## 2014-10-12 NOTE — Progress Notes (Signed)
Preoperative surgical orders have been place into the Epic hospital system for Sheri [name] on 10/12/2014, 12:43 PM  by Mickel Crow for surgery on 11-13-2014.  Preop Total Knee orders including Experal, IV Tylenol, and IV Decadron as long as there are no contraindications to the above medications. [name], PA-C

## 2014-10-19 DIAGNOSIS — M1712 Unilateral primary osteoarthritis, left knee: Secondary | ICD-10-CM | POA: Diagnosis not present

## 2014-12-26 ENCOUNTER — Ambulatory Visit: Payer: Self-pay | Admitting: Orthopedic Surgery

## 2014-12-26 NOTE — Progress Notes (Signed)
Preoperative surgical orders have been place into the Epic hospital system for Sheri [name] on 12/26/2014, 9:53 AM  by Mickel Crow for surgery on 01-15-2015.  Preop Total Knee orders including Experal, IV Tylenol, and IV Decadron as long as there are no contraindications to the above medications. [name], PA-C

## 2015-01-04 ENCOUNTER — Encounter (HOSPITAL_COMMUNITY)
Admission: RE | Admit: 2015-01-04 | Discharge: 2015-01-04 | Disposition: A | Discharge status: Home / Self Care | Payer: Medicare Other | Source: Ambulatory Visit | Attending: Orthopedic Surgery | Admitting: Orthopedic Surgery

## 2015-01-04 ENCOUNTER — Encounter (HOSPITAL_COMMUNITY): Payer: Self-pay

## 2015-01-04 DIAGNOSIS — Z01818 Encounter for other preprocedural examination: Secondary | ICD-10-CM | POA: Insufficient documentation

## 2015-01-04 HISTORY — DX: Personal history of other medical treatment: Z92.89

## 2015-01-04 HISTORY — DX: Cardiac murmur, unspecified: R01.1

## 2015-01-04 LAB — URINALYSIS, ROUTINE W REFLEX MICROSCOPIC
Bilirubin Urine: NEGATIVE
Glucose, UA: NEGATIVE mg/dL
Hgb urine dipstick: NEGATIVE
Ketones, ur: NEGATIVE mg/dL
Nitrite: NEGATIVE
Protein, ur: NEGATIVE mg/dL
Specific Gravity, Urine: 1.031 — ABNORMAL HIGH (ref 1.005–1.030)
Urobilinogen, UA: 0.2 mg/dL (ref 0.0–1.0)
pH: 5 (ref 5.0–8.0)

## 2015-01-04 LAB — COMPREHENSIVE METABOLIC PANEL
ALT: 26 U/L (ref 14–54)
AST: 28 U/L (ref 15–41)
Albumin: 4.5 g/dL (ref 3.5–5.0)
Alkaline Phosphatase: 71 U/L (ref 38–126)
Anion gap: 6 (ref 5–15)
BUN: 22 mg/dL — ABNORMAL HIGH (ref 6–20)
CO2: 27 mmol/L (ref 22–32)
Calcium: 9.8 mg/dL (ref 8.9–10.3)
Chloride: 105 mmol/L (ref 101–111)
Creatinine, Ser: 0.74 mg/dL (ref 0.44–1.00)
GFR calc Af Amer: >60 mL/min (ref >60)
GFR calc non Af Amer: >60 mL/min (ref >60)
Glucose, Bld: 103 mg/dL — ABNORMAL HIGH (ref 70–99)
Potassium: 4.4 mmol/L (ref 3.5–5.1)
Sodium: 138 mmol/L (ref 135–145)
Total Bilirubin: 0.7 mg/dL (ref 0.3–1.2)
Total Protein: 7.3 g/dL (ref 6.5–8.1)

## 2015-01-04 LAB — APTT: aPTT: 34 seconds (ref 24–37)

## 2015-01-04 LAB — URINE MICROSCOPIC-ADD ON

## 2015-01-04 LAB — CBC
HCT: 39.4 % (ref 36.0–46.0)
Hemoglobin: 12.7 g/dL (ref 12.0–15.0)
MCH: 29.7 pg (ref 26.0–34.0)
MCHC: 32.2 g/dL (ref 30.0–36.0)
MCV: 92.3 fL (ref 78.0–100.0)
Platelets: 386 10*3/uL (ref 150–400)
RBC: 4.27 MIL/uL (ref 3.87–5.11)
RDW: 14.2 % (ref 11.5–15.5)
WBC: 8.6 10*3/uL (ref 4.0–10.5)

## 2015-01-04 LAB — SURGICAL PCR SCREEN
MRSA, PCR: NEGATIVE
Staphylococcus aureus: NEGATIVE

## 2015-01-04 LAB — PROTIME-INR
INR: 0.95 (ref 0.00–1.49)
Prothrombin Time: 12.8 seconds (ref 11.6–15.2)

## 2015-01-04 NOTE — Progress Notes (Signed)
EKG- 09/04/14 on chart  11/16/2013 LOV with Dr Fransico Him in [facility]

## 2015-01-04 NOTE — Progress Notes (Signed)
CMP U/A and micro results done 01/04/15 faxed via EPIC to Dr Wynelle Link.

## 2015-01-04 NOTE — Patient Instructions (Signed)
Sheri [name]  01/04/2015   Your procedure is scheduled on:  01/15/15   Report to [facility] Main  Entrance and follow signs to               [place] at    0600 AM.  Call this number if you have problems the morning of surgery [phone]   Remember:  Do not eat food or drink liquids :After Midnight.     Take these medicines the morning of surgery with A SIP OF WATER:   Synthroid, Protonix                                You may not have any metal on your body including hair pins and              piercings  Do not wear jewelry, make-up, lotions, powders or perfumes., deodorant.               Do not wear nail polish.  Do not shave  48 hours prior to surgery.               Do not bring valuables to the hospital. [place].  Contacts, dentures or bridgework may not be worn into surgery.  Leave suitcase in the car. After surgery it may be brought to your room.      Special Instructions: coughing and deep breathing exercises, leg exercises               Please read over the following fact sheets you were given: _____________________________________________________________________             [facility] - Preparing for Surgery Before surgery, you can play an important role.  Because skin is not sterile, your skin needs to be as free of germs as possible.  You can reduce the number of germs on your skin by washing with CHG (chlorahexidine gluconate) soap before surgery.  CHG is an antiseptic cleaner which kills germs and bonds with the skin to continue killing germs even after washing. Please DO NOT use if you have an allergy to CHG or antibacterial soaps.  If your skin becomes reddened/irritated stop using the CHG and inform your nurse when you arrive at Short Stay. Do not shave (including legs and underarms) for at least 48 hours prior to the first CHG shower.  You may shave your face/neck. Please  follow these instructions carefully:  1.  Shower with CHG Soap the night before surgery and the  morning of Surgery.  2.  If you choose to wash your hair, wash your hair first as usual with your  normal  shampoo.  3.  After you shampoo, rinse your hair and body thoroughly to remove the  shampoo.                           4.  Use CHG as you would any other liquid soap.  You can apply chg directly  to the skin and wash                       Gently with a scrungie or clean washcloth.  5.  Apply  the CHG Soap to your body ONLY FROM THE NECK DOWN.   Do not use on face/ open                           Wound or open sores. Avoid contact with eyes, ears mouth and genitals (private parts).                       Wash face,  Genitals (private parts) with your normal soap.             6.  Wash thoroughly, paying special attention to the area where your surgery  will be performed.  7.  Thoroughly rinse your body with warm water from the neck down.  8.  DO NOT shower/wash with your normal soap after using and rinsing off  the CHG Soap.                9.  Pat yourself dry with a clean towel.            10.  Wear clean pajamas.            11.  Place clean sheets on your bed the night of your first shower and do not  sleep with pets. Day of Surgery : Do not apply any lotions/deodorants the morning of surgery.  Please wear clean clothes to the hospital/surgery center.  FAILURE TO FOLLOW THESE INSTRUCTIONS MAY RESULT IN THE CANCELLATION OF YOUR SURGERY PATIENT SIGNATURE_________________________________  NURSE SIGNATURE__________________________________  ________________________________________________________________________  WHAT IS A BLOOD TRANSFUSION? Blood Transfusion Information  A transfusion is the replacement of blood or some of its parts. Blood is made up of multiple cells which provide different functions.  Red blood cells carry oxygen and are used for blood loss replacement.  White blood cells  fight against infection.  Platelets control bleeding.  Plasma helps clot blood.  Other blood products are available for specialized needs, such as hemophilia or other clotting disorders. BEFORE THE TRANSFUSION  Who gives blood for transfusions?   Healthy volunteers who are fully evaluated to make sure their blood is safe. This is blood bank blood. Transfusion therapy is the safest it has ever been in the practice of medicine. Before blood is taken from a donor, a complete history is taken to make sure that person has no history of diseases nor engages in risky social behavior (examples are intravenous drug use or sexual activity with multiple partners). The donor's travel history is screened to minimize risk of transmitting infections, such as malaria. The donated blood is tested for signs of infectious diseases, such as HIV and hepatitis. The blood is then tested to be sure it is compatible with you in order to minimize the chance of a transfusion reaction. If you or a relative donates blood, this is often done in anticipation of surgery and is not appropriate for emergency situations. It takes many days to process the donated blood. RISKS AND COMPLICATIONS Although transfusion therapy is very safe and saves many lives, the main dangers of transfusion include:  1. Getting an infectious disease. 2. Developing a transfusion reaction. This is an allergic reaction to something in the blood you were given. Every precaution is taken to prevent this. The decision to have a blood transfusion has been considered carefully by your caregiver before blood is given. Blood is not given unless the benefits outweigh the risks. AFTER THE TRANSFUSION  Right after receiving a blood  transfusion, you will usually feel much better and more energetic. This is especially true if your red blood cells have gotten low (anemic). The transfusion raises the level of the red blood cells which carry oxygen, and this usually  causes an energy increase.  The nurse administering the transfusion will monitor you carefully for complications. HOME CARE INSTRUCTIONS  No special instructions are needed after a transfusion. You may find your energy is better. Speak with your caregiver about any limitations on activity for underlying diseases you may have. SEEK MEDICAL CARE IF:   Your condition is not improving after your transfusion.  You develop redness or irritation at the intravenous (IV) site. SEEK IMMEDIATE MEDICAL CARE IF:  Any of the following symptoms occur over the next 12 hours:  Shaking chills.  You have a temperature by mouth above 102 F (38.9 C), not controlled by medicine.  Chest, back, or muscle pain.  People around you feel you are not acting correctly or are confused.  Shortness of breath or difficulty breathing.  Dizziness and fainting.  You get a rash or develop hives.  You have a decrease in urine output.  Your urine turns a dark color or changes to pink, red, or brown. Any of the following symptoms occur over the next 10 days:  You have a temperature by mouth above 102 F (38.9 C), not controlled by medicine.  Shortness of breath.  Weakness after normal activity.  The white part of the eye turns yellow (jaundice).  You have a decrease in the amount of urine or are urinating less often.  Your urine turns a dark color or changes to pink, red, or brown. Document Released: 08/15/2000 Document Revised: 11/10/2011 Document Reviewed: 04/03/2008 ExitCare Patient Information 2014 [place].  _______________________________________________________________________  Incentive Spirometer  An incentive spirometer is a tool that can help keep your lungs clear and active. This tool measures how well you are filling your lungs with each breath. Taking long deep breaths may help reverse or decrease the chance of developing breathing (pulmonary) problems (especially infection)  following:  A long period of time when you are unable to move or be active. BEFORE THE PROCEDURE   If the spirometer includes an indicator to show your best effort, your nurse or respiratory therapist will set it to a desired goal.  If possible, sit up straight or lean slightly forward. Try not to slouch.  Hold the incentive spirometer in an upright position. INSTRUCTIONS FOR USE  3. Sit on the edge of your bed if possible, or sit up as far as you can in bed or on a chair. 4. Hold the incentive spirometer in an upright position. 5. Breathe out normally. 6. Place the mouthpiece in your mouth and seal your lips tightly around it. 7. Breathe in slowly and as deeply as possible, raising the piston or the ball toward the top of the column. 8. Hold your breath for 3-5 seconds or for as long as possible. Allow the piston or ball to fall to the bottom of the column. 9. Remove the mouthpiece from your mouth and breathe out normally. 10. Rest for a few seconds and repeat Steps 1 through 7 at least 10 times every 1-2 hours when you are awake. Take your time and take a few normal breaths between deep breaths. 11. The spirometer may include an indicator to show your best effort. Use the indicator as a goal to work toward during each repetition. 12. After each set of 10 deep  breaths, practice coughing to be sure your lungs are clear. If you have an incision (the cut made at the time of surgery), support your incision when coughing by placing a pillow or rolled up towels firmly against it. Once you are able to get out of bed, walk around indoors and cough well. You may stop using the incentive spirometer when instructed by your caregiver.  RISKS AND COMPLICATIONS  Take your time so you do not get dizzy or light-headed.  If you are in pain, you may need to take or ask for pain medication before doing incentive spirometry. It is harder to take a deep breath if you are having pain. AFTER USE  Rest and  breathe slowly and easily.  It can be helpful to keep track of a log of your progress. Your caregiver can provide you with a simple table to help with this. If you are using the spirometer at home, follow these instructions: [place] IF:   You are having difficultly using the spirometer.  You have trouble using the spirometer as often as instructed.  Your pain medication is not giving enough relief while using the spirometer.  You develop fever of 100.5 F (38.1 C) or higher. SEEK IMMEDIATE MEDICAL CARE IF:   You cough up bloody sputum that had not been present before.  You develop fever of 102 F (38.9 C) or greater.  You develop worsening pain at or near the incision site. MAKE SURE YOU:   Understand these instructions.  Will watch your condition.  Will get help right away if you are not doing well or get worse. Document Released: 12/29/2006 Document Revised: 11/10/2011 Document Reviewed: 03/01/2007 [facility] Patient Information 2014 [place], [place].   ________________________________________________________________________

## 2015-01-12 ENCOUNTER — Ambulatory Visit: Payer: Self-pay | Admitting: Orthopedic Surgery

## 2015-01-12 NOTE — H&P (Signed)
Sheri [name] [place] DOB: 12/06/1950 Married / Language: English / Race: White Female Date of Admission:  01/15/2015 CC:  Left Knee Pain History of Present Illness ([name] PA-C; 01/10/2015 9:01 AM) The patient is a 64 year old female who comes in for a preoperative History and Physical. The patient is scheduled for a left total knee arthroplasty to be performed by Dr. Dione Plover. Aluisio, MD at [facility] on 01-15-2015. The patient is a 64 year old female being followed for their left knee pain and osteoarthritis. They are now post cortisone injection. Symptoms reported include: pain, swelling, grinding and difficulty ambulating. The patient feels that they are doing poorly and report their pain level to be mild to moderate. The following medication has been used for pain control: Tylenol and tramadol. The patient has reported improvement of their symptoms with: Cortisone injections (but it did not last very long). Unfortunately, her left knee is getting progressively worse. It is limiting what she can and cannot do. She's at a stage where she feels like the cortisone is not helping as much as it used to do. [place] supplements have not worked for her in the past. She has had her right knee replaced. She's had some soreness in it recently but feels like she may be overloading that knee. She is also having pain in the mid thigh area and in the buttock. She said that the pain from the buttock and mid thigh radiate to her knee. She has not had any swelling in the right knee. She has had progressively worsening pain and dysfunction and wants to go ahead and get the knee replaced. She has had the other knee replaced but I told her the differences we utilize as compared to her surgeon out near [place]. I told her about the [facility] and the rehab. I would like for her to stay in town for two weeks post operative for home health physical therapy and she agrees to that. After that, [place]. I discussed everything in detail in regards to the knee replacement. She will proceed as planned. They have been treated conservatively in the past for the above stated problem and despite conservative measures, they continue to have progressive pain and severe functional limitations and dysfunction. They have failed non-operative management including home exercise, medications, and injections. It is felt that they would benefit from undergoing total joint replacement. Risks and benefits of the procedure have been discussed with the patient and they elect to proceed with surgery. There are no active contraindications to surgery such as ongoing infection or rapidly progressive neurological disease.  Problem List/Past Medical ([name], III PA-C; 01/10/2015 9:01 AM) Status post total right knee replacement (Z96.651) Left knee pain (M25.562) Chronic low back pain (M54.5) Lumbar/Lumbosacral Disc Degeneration (722.52) Primary osteoarthritis of left knee (M17.12) Anemia Heart murmur High blood pressure Diabetes Mellitus, Type II Hypercholesterolemia Osteoarthritis Hypothyroidism Osteoarthrosis NOS, ankle/foot (715.97)02/26/2004 Degenerative Disc Disease  Allergies No Known Drug Allergies  Family History ([name], III PA-C; 01/04/2015 2:39 PM) Osteoporosis mother Osteoarthritis mother and sister Rheumatoid Arthritis grandmother mothers side Cerebrovascular Accident First Degree Relatives. mother Chronic Obstructive Lung Disease mother and father Cancer grandmother fathers side Diabetes Mellitus father Hypertension father Heart Disease mother, grandfather mothers side and grandfather fathers side  Social History ([name], III PA-C; 02/18/5092 2:67 AM) Illicit drug use no Living situation live with spouse Exercise Exercises  rarely; does running /  walking Number of flights of stairs before winded 2-3 Current work status working part time Previously in rehab no Children 2 Drug/Alcohol Rehab (Currently) no Alcohol use current drinker; drinks wine; only occasionally per week Marital status married Most recent primary occupation receptionist Pain Contract no Tobacco / smoke exposure no Tobacco use Never smoker. never smoker Advance Directives Living Will, Healthcare POA  Medication History ([name], III PA-C; 01/10/2015 9:03 AM) Tramadol (prn) Active. Januvia (Oral) Specific dose unknown - Active. Tylenol Extra Strength (500MG  Tablet, Oral) Active. Vitamin D (Oral) Specific dose unknown - Active. Turmeric (Oral) Specific dose unknown - Active. Synthroid (125MCG Tablet, Oral) Active. Protonix (Oral) Specific dose unknown - Active. Lotrel (5-20MG  Capsule, Oral) Active. Aspirin EC (81MG  Tablet DR, Oral) Active. Mobic (15MG  Tablet, Oral) Active.  Past Surgical History ([name], III PA-C; 01/10/2015 9:02 AM) Cesarean Delivery Date: 1982. 1 time Arthroscopy of Knee right Gallbladder Surgery Date: 1987. open Total Knee Replacement Date: 12/2011. right   Review of Systems ([name] III PA-C; 01/10/2015 9:03 AM) General Not Present- Chills, Fatigue, Fever, Memory Loss, Night Sweats, Weight Gain and Weight Loss. Skin Not Present- Eczema, Hives, Itching, Lesions and Rash. HEENT Not Present- Dentures, Double Vision, Headache, Hearing Loss, Tinnitus and Visual Loss. Respiratory Not Present- Allergies, Chronic Cough, Coughing up blood, Shortness of breath at rest and Shortness of breath with exertion. Cardiovascular Not Present- Chest Pain, Difficulty Breathing Lying Down, Murmur, Palpitations, Racing/skipping heartbeats and Swelling. Gastrointestinal Not Present- Abdominal Pain, Bloody Stool, Constipation, Diarrhea, Difficulty Swallowing, Heartburn, Jaundice, Loss of  appetitie, Nausea and Vomiting. Female Genitourinary Not Present- Blood in Urine, Discharge, Flank Pain, Incontinence, Painful Urination, Urgency, Urinary frequency, Urinary Retention, Urinating at Night and Weak urinary stream. Musculoskeletal Present- Back Pain, Joint Pain and Morning Stiffness. Not Present- Joint Swelling, Muscle Pain, Muscle Weakness and Spasms. Neurological Not Present- Blackout spells, Difficulty with balance, Dizziness, Paralysis, Tremor and Weakness. Psychiatric Not Present- Insomnia.   Vitals (Graciano Batson L. Affan Callow III PA-C; 01/04/2015 2:45 PM) 01/04/2015 2:44 PM Weight: 205 lb Height: 65in Weight was reported by patient. Body Surface Area: 2 m Body Mass Index: 34.11 kg/m  BP: 134/82 (Sitting, Left Arm, Standard)  Physical Exam (Seanmichael Salmons L. Amoura Ransier III PA-C; 01/04/2015 2:48 PM) General Mental Status -Alert, cooperative and good historian. General Appearance-pleasant, Not in acute distress. Orientation-Oriented X3. Build & Nutrition-Well nourished and Well developed.  Head and Neck Head-normocephalic, atraumatic . Neck Global Assessment - supple, no bruit auscultated on the right, no bruit auscultated on the left.  Eye Vision-Wears contact lenses. Pupil - Bilateral-Regular and Round. Motion - Bilateral-EOMI.  Chest and Lung Exam Auscultation Breath sounds - clear at anterior chest wall and clear at posterior chest wall. Adventitious sounds - No Adventitious sounds.  Cardiovascular Auscultation Rhythm - Regular rate and rhythm. Heart Sounds - S1 WNL and S2 WNL. Murmurs & Other Heart Sounds: Murmur 1 - Location - Aortic Area and Sternal Border - Left. Timing - Early systolic. Grade - II/VI. Character - Low pitched and Systolic Ejection.  Abdomen Inspection Contour - Generalized mild distention. Palpation/Percussion Tenderness - Abdomen is non-tender to palpation. Rigidity (guarding) - Abdomen is  soft. Auscultation Auscultation of the abdomen reveals - Bowel sounds normal.  Female Genitourinary Note: Not done, not pertinent to present illness   Musculoskeletal Note: She is a well developed female. She is alert and oriented. No apparent distress. Evaluation of her left knee shows no effusion. She has a valgus deformity.  Range is about 5-125. Moderate crepitus on range of motion. Tenderness medial and lateral with no instability.  RADIOGRAPHS: Radiographs from September and she has bone on bone change lateral and patellofemoral.   Assessment & Plan (Londyn Hotard L. Keshauna Degraffenreid III PA-C; 01/04/2015 2:48 PM) Primary osteoarthritis of left knee (M17.12) Note:Surgical Plans: Left Total Knee Replacement  Disposition: Home  PCP: Dr. Cipriano Mile  IV TXA  Anesthesia Issues: NONE  Signed electronically by Joelene Millin, III PA-C

## 2015-01-15 ENCOUNTER — Encounter (HOSPITAL_COMMUNITY): Admission: RE | Payer: Self-pay | Source: Ambulatory Visit

## 2015-01-15 ENCOUNTER — Inpatient Hospital Stay (HOSPITAL_COMMUNITY): Admission: RE | Admit: 2015-01-15 | Payer: Medicare Other | Source: Ambulatory Visit | Admitting: Orthopedic Surgery

## 2015-01-15 SURGERY — ARTHROPLASTY, KNEE, TOTAL
Anesthesia: Choice | Laterality: Left

## 2015-01-25 ENCOUNTER — Ambulatory Visit: Payer: Self-pay | Admitting: Orthopedic Surgery

## 2015-01-25 NOTE — H&P (Signed)
Sheri [name] Canal DOB: [birth year] Married / Language: English / Race: White Female Date of Admission:  02/12/2015 CC: Left Knee Pain History of Present Illness  The patient is a 64 year old female who comes in today for a preoperative History and Physical. The patient is scheduled for a left total knee arthroplasty to be performed by Dr. Dione Plover. Aluisio, MD at [facility] on 02/12/2015. The patient is a 64 year old female being followed for their left knee pain and osteoarthritis. They are now post cortisone injection. Symptoms reported include: pain, swelling, grinding and difficulty ambulating. The patient feels that they are doing poorly and report their pain level to be mild to moderate. The following medication has been used for pain control: Tylenol and tramadol. The patient has reported improvement of their symptoms with: Cortisone injections (but it did not last very long). Unfortunately, her left knee is getting progressively worse. It is limiting what she can and cannot do. She's at a stage where she feels like the cortisone is not helping as much as it used to do. Visco supplements have not worked for her in the past. She has had her right knee replaced. She's had some soreness in it recently but feels like she may be overloading that knee. She is also having pain in the mid thigh area and in the buttock. She said that the pain from the buttock and mid thigh radiate to her knee. She has not had any swelling in the right knee. She has had progressively worsening pain and dysfunction and wants to go ahead and get the knee replaced. She has had the other knee replaced but I told her the differences we utilize as compared to her surgeon out near [place]. I told her about the [facility] and the rehab. I would like for her to stay in town for two weeks post operative for home health physical therapy and she agrees to that. After [place]. I discussed everything in detail in regards to the knee replacement. She will proceed as planned. They have been treated conservatively in the past for the above stated problem and despite conservative measures, they continue to have progressive pain and severe functional limitations and dysfunction. They have failed non-operative management including home exercise, medications, and injections. It is felt that they would benefit from undergoing total joint replacement. Risks and benefits of the procedure have been discussed with the patient and they elect to proceed with surgery. There are no active contraindications to surgery such as ongoing infection or rapidly progressive neurological disease.  Problem List/Past Medical Status post total right knee replacement (Z96.651) Left knee pain (M25.562) Chronic low back pain (M54.5) Lumbar/Lumbosacral Disc Degeneration (722.52) Pain, Hip (719.45) Primary osteoarthritis of left knee (M17.12) Anemia Heart murmur High blood pressure Diabetes Mellitus, Type II Hypercholesterolemia Osteoarthritis Hypothyroidism Osteoarthrosis NOS, ankle/foot (715.97)02/26/2004 Degenerative Disc Disease  Allergies  No Known Drug Allergies  Family History Osteoporosis mother Osteoarthritis mother and sister Rheumatoid Arthritis grandmother mothers side Cerebrovascular Accident First Degree Relatives. mother Chronic Obstructive Lung Disease mother and father Cancer grandmother fathers side Diabetes Mellitus father Hypertension father Heart Disease mother, grandfather mothers side and grandfather fathers side  Social History Illicit drug use no Living situation live with spouse Exercise Exercises rarely; does running / walking Number of flights of stairs before winded 2-3 Current work status working part time Previously in rehab no [facility] (  Currently) no Alcohol use  current drinker; drinks wine; only occasionally per week Marital status married Most recent primary occupation receptionist Pain Contract no Tobacco / smoke exposure no Tobacco use Never smoker. never smoker Advance Directives Living Will, Healthcare POA  Medication History Tramadol (prn) Active. Januvia (Oral) Specific dose unknown - Active. Tylenol Extra Strength (500MG  Tablet, Oral) Active. Vitamin D (Oral) Specific dose unknown - Active. Turmeric (Oral) Specific dose unknown - Active. Synthroid (125MCG Tablet, Oral) Active. Protonix (Oral) Specific dose unknown - Active. Lotrel (5-20MG  Capsule, Oral) Active. Aspirin EC (81MG  Tablet DR, Oral) Active. Mobic (15MG  Tablet, Oral) Active.  Past Surgical History Cesarean Delivery Date: 1982. 1 time Arthroscopy of Knee right Gallbladder Surgery Date: 61. open Total Knee Replacement Date: 12/2011. right  Review of System General Not Present- Chills, Fatigue, Fever, Memory Loss, Night Sweats, Weight Gain and Weight Loss. Skin Not Present- Eczema, Hives, Itching, Lesions and Rash. HEENT Not Present- Dentures, Double Vision, Headache, Hearing Loss, Tinnitus and Visual Loss. Respiratory Not Present- Allergies, Chronic Cough, Coughing up blood, Shortness of breath at rest and Shortness of breath with exertion. Cardiovascular Not Present- Chest Pain, Difficulty Breathing Lying Down, Murmur, Palpitations, Racing/skipping heartbeats and Swelling. Gastrointestinal Not Present- Abdominal Pain, Bloody Stool, Constipation, Diarrhea, Difficulty Swallowing, Heartburn, Jaundice, Loss of appetitie, Nausea and Vomiting. Female Genitourinary Not Present- Blood in Urine, Discharge, Flank Pain, Incontinence, Painful Urination, Urgency, Urinary frequency, Urinary Retention, Urinating at Night and Weak urinary stream. Musculoskeletal Present- Back Pain, Joint Pain and Morning Stiffness. Not Present- Joint Swelling, Muscle Pain, Muscle  Weakness and Spasms. Neurological Not Present- Blackout spells, Difficulty with balance, Dizziness, Paralysis, Tremor and Weakness. Psychiatric Not Present- Insomnia.  Vitals Weight: 205 lb Height: 65in Weight was reported by patient. Body Surface Area: 2 m Body Mass Index: 34.11 kg/m  BP: 134/82 (Sitting, Left Arm, Standard)  Physical Exam (Simonne Boulos L. [place] Grayer III PA-C; 01/04/2015 2:48 PM) General Mental Status -Alert, cooperative and good historian. General Appearance-pleasant, Not in acute distress. Orientation-Oriented X3. Build & Nutrition-Well nourished and Well developed.  Head and Neck Head-normocephalic, atraumatic . Neck Global Assessment - supple, no bruit auscultated on the right, no bruit auscultated on the left.  Eye Vision-Wears contact lenses. Pupil - Bilateral-Regular and Round. Motion - Bilateral-EOMI.  Chest and Lung Exam Auscultation Breath sounds - clear at anterior chest wall and clear at posterior chest wall. Adventitious sounds - No Adventitious sounds.  Cardiovascular Auscultation Rhythm - Regular rate and rhythm. Heart Sounds - S1 WNL and S2 WNL. Murmurs & Other Heart Sounds: Murmur 1 - Location - Aortic Area and Sternal Border - Left. Timing - Early systolic. Grade - II/VI. Character - Low pitched and Systolic Ejection.  Abdomen Inspection Contour - Generalized mild distention. Palpation/Percussion Tenderness - Abdomen is non-tender to palpation. Rigidity (guarding) - Abdomen is soft. Auscultation Auscultation of the abdomen reveals - Bowel sounds normal.  Female Genitourinary Note: Not done, not pertinent to present illness   Musculoskeletal Note: She is a well developed female. She is alert and oriented. No apparent distress. Evaluation of her left knee shows no effusion. She has a valgus deformity. Range is about 5-125. Moderate crepitus on range of motion. Tenderness medial and lateral with no  instability.  RADIOGRAPHS: Radiographs from September and she has bone on bone change lateral and patellofemoral.  Assessment & Plan  Primary osteoarthritis of left knee (M17.12) Note:Surgical Plans: Left Total Knee Replacement  Disposition: Home  PCP: Dr. Cipriano Mile  IV TXA  Anesthesia Issues: NONE  Signed electronically by Joelene Millin, III PA-C

## 2015-02-02 NOTE — Patient Instructions (Signed)
KADAJAH KJOS  02/02/2015   Your procedure is scheduled on:     02/12/2015    Report to [facility] Main  Entrance and follow signs to               [place] at       [place] AM.  Call this number if you have problems the morning of surgery [phone]   Remember: ONLY 1 PERSON MAY GO WITH YOU TO SHORT STAY TO GET  READY MORNING OF [place].  Do not eat food or drink liquids :After Midnight.     Take these medicines the morning of surgery with A SIP OF WATER:   Synthroid , Protonix                                You may not have any metal on your body including hair pins and              piercings  Do not wear jewelry, make-up, lotions, powders or perfumes, deodorant             Do not wear nail polish.  Do not shave  48 hours prior to surgery.              Do not bring valuables to the hospital. [place].  Contacts, dentures or bridgework may not be worn into surgery.  Leave suitcase in the car. After surgery it may be brought to your room.         Special Instructions: coughing and deep breathing exercises, leg exercises               Please read over the following fact sheets you were given: _____________________________________________________________________             [facility] - Preparing for Surgery Before surgery, you can play an important role.  Because skin is not sterile, your skin needs to be as free of germs as possible.  You can reduce the number of germs on your skin by washing with CHG (chlorahexidine gluconate) soap before surgery.  CHG is an antiseptic cleaner which kills germs and bonds with the skin to continue killing germs even after washing. Please DO NOT use if you have an allergy to CHG or antibacterial soaps.  If your skin becomes reddened/irritated stop using the CHG and inform your nurse when you arrive at Short Stay. Do not shave (including legs and underarms)  for at least 48 hours prior to the first CHG shower.  You may shave your face/neck. Please follow these instructions carefully:  1.  Shower with CHG Soap the night before surgery and the  morning of Surgery.  2.  If you choose to wash your hair, wash your hair first as usual with your  normal  shampoo.  3.  After you shampoo, rinse your hair and body thoroughly to remove the  shampoo.                           4.  Use CHG as you would any other liquid soap.  You can apply chg directly  to the skin and wash  Gently with a scrungie or clean washcloth.  5.  Apply the CHG Soap to your body ONLY FROM THE NECK DOWN.   Do not use on face/ open                           Wound or open sores. Avoid contact with eyes, ears mouth and genitals (private parts).                       Wash face,  Genitals (private parts) with your normal soap.             6.  Wash thoroughly, paying special attention to the area where your surgery  will be performed.  7.  Thoroughly rinse your body with warm water from the neck down.  8.  DO NOT shower/wash with your normal soap after using and rinsing off  the CHG Soap.                9.  Pat yourself dry with a clean towel.            10.  Wear clean pajamas.            11.  Place clean sheets on your bed the night of your first shower and do not  sleep with pets. Day of Surgery : Do not apply any lotions/deodorants the morning of surgery.  Please wear clean clothes to the hospital/surgery center.  FAILURE TO FOLLOW THESE INSTRUCTIONS MAY RESULT IN THE CANCELLATION OF YOUR SURGERY PATIENT SIGNATURE_________________________________  NURSE SIGNATURE__________________________________  ________________________________________________________________________  WHAT IS A BLOOD TRANSFUSION? Blood Transfusion Information  A transfusion is the replacement of blood or some of its parts. Blood is made up of multiple cells which provide different  functions.  Red blood cells carry oxygen and are used for blood loss replacement.  White blood cells fight against infection.  Platelets control bleeding.  Plasma helps clot blood.  Other blood products are available for specialized needs, such as hemophilia or other clotting disorders. BEFORE THE TRANSFUSION  Who gives blood for transfusions?   Healthy volunteers who are fully evaluated to make sure their blood is safe. This is blood bank blood. Transfusion therapy is the safest it has ever been in the practice of medicine. Before blood is taken from a donor, a complete history is taken to make sure that person has no history of diseases nor engages in risky social behavior (examples are intravenous drug use or sexual activity with multiple partners). The donor's travel history is screened to minimize risk of transmitting infections, such as malaria. The donated blood is tested for signs of infectious diseases, such as HIV and hepatitis. The blood is then tested to be sure it is compatible with you in order to minimize the chance of a transfusion reaction. If you or a relative donates blood, this is often done in anticipation of surgery and is not appropriate for emergency situations. It takes many days to process the donated blood. RISKS AND COMPLICATIONS Although transfusion therapy is very safe and saves many lives, the main dangers of transfusion include:  1. Getting an infectious disease. 2. Developing a transfusion reaction. This is an allergic reaction to something in the blood you were given. Every precaution is taken to prevent this. The decision to have a blood transfusion has been considered carefully by your caregiver before blood is given. Blood is not given unless the benefits outweigh  the risks. AFTER THE TRANSFUSION  Right after receiving a blood transfusion, you will usually feel much better and more energetic. This is especially true if your red blood cells have gotten low  (anemic). The transfusion raises the level of the red blood cells which carry oxygen, and this usually causes an energy increase.  The nurse administering the transfusion will monitor you carefully for complications. HOME CARE INSTRUCTIONS  No special instructions are needed after a transfusion. You may find your energy is better. Speak with your caregiver about any limitations on activity for underlying diseases you may have. SEEK MEDICAL CARE IF:   Your condition is not improving after your transfusion.  You develop redness or irritation at the intravenous (IV) site. SEEK IMMEDIATE MEDICAL CARE IF:  Any of the following symptoms occur over the next 12 hours:  Shaking chills.  You have a temperature by mouth above 102 F (38.9 C), not controlled by medicine.  Chest, back, or muscle pain.  People around you feel you are not acting correctly or are confused.  Shortness of breath or difficulty breathing.  Dizziness and fainting.  You get a rash or develop hives.  You have a decrease in urine output.  Your urine turns a dark color or changes to pink, red, or brown. Any of the following symptoms occur over the next 10 days:  You have a temperature by mouth above 102 F (38.9 C), not controlled by medicine.  Shortness of breath.  Weakness after normal activity.  The white part of the eye turns yellow (jaundice).  You have a decrease in the amount of urine or are urinating less often.  Your urine turns a dark color or changes to pink, red, or brown. Document Released: 08/15/2000 Document Revised: 11/10/2011 Document Reviewed: 04/03/2008 ExitCare Patient Information 2014 [place].  _______________________________________________________________________  Incentive Spirometer  An incentive spirometer is a tool that can help keep your lungs clear and active. This tool measures how well you are filling your lungs with each breath. Taking long deep breaths may help  reverse or decrease the chance of developing breathing (pulmonary) problems (especially infection) following:  A long period of time when you are unable to move or be active. BEFORE THE PROCEDURE   If the spirometer includes an indicator to show your best effort, your nurse or respiratory therapist will set it to a desired goal.  If possible, sit up straight or lean slightly forward. Try not to slouch.  Hold the incentive spirometer in an upright position. INSTRUCTIONS FOR USE  3. Sit on the edge of your bed if possible, or sit up as far as you can in bed or on a chair. 4. Hold the incentive spirometer in an upright position. 5. Breathe out normally. 6. Place the mouthpiece in your mouth and seal your lips tightly around it. 7. Breathe in slowly and as deeply as possible, raising the piston or the ball toward the top of the column. 8. Hold your breath for 3-5 seconds or for as long as possible. Allow the piston or ball to fall to the bottom of the column. 9. Remove the mouthpiece from your mouth and breathe out normally. 10. Rest for a few seconds and repeat Steps 1 through 7 at least 10 times every 1-2 hours when you are awake. Take your time and take a few normal breaths between deep breaths. 11. The spirometer may include an indicator to show your best effort. Use the indicator as a goal to work  toward during each repetition. 12. After each set of 10 deep breaths, practice coughing to be sure your lungs are clear. If you have an incision (the cut made at the time of surgery), support your incision when coughing by placing a pillow or rolled up towels firmly against it. Once you are able to get out of bed, walk around indoors and cough well. You may stop using the incentive spirometer when instructed by your caregiver.  RISKS AND COMPLICATIONS  Take your time so you do not get dizzy or light-headed.  If you are in pain, you may need to take or ask for pain medication before doing  incentive spirometry. It is harder to take a deep breath if you are having pain. AFTER USE  Rest and breathe slowly and easily.  It can be helpful to keep track of a log of your progress. Your caregiver can provide you with a simple table to help with this. If you are using the spirometer at home, follow these instructions: [place] IF:   You are having difficultly using the spirometer.  You have trouble using the spirometer as often as instructed.  Your pain medication is not giving enough relief while using the spirometer.  You develop fever of 100.5 F (38.1 C) or higher. SEEK IMMEDIATE MEDICAL CARE IF:   You cough up bloody sputum that had not been present before.  You develop fever of 102 F (38.9 C) or greater.  You develop worsening pain at or near the incision site. MAKE SURE YOU:   Understand these instructions.  Will watch your condition.  Will get help right away if you are not doing well or get worse. Document Released: 12/29/2006 Document Revised: 11/10/2011 Document Reviewed: 03/01/2007 [facility] Patient Information 2014 [place], [place].   ________________________________________________________________________

## 2015-02-05 ENCOUNTER — Encounter (HOSPITAL_COMMUNITY)
Admission: RE | Admit: 2015-02-05 | Discharge: 2015-02-05 | Disposition: A | Discharge status: Home / Self Care | Payer: Medicare Other | Source: Ambulatory Visit | Attending: Orthopedic Surgery | Admitting: Orthopedic Surgery

## 2015-02-05 ENCOUNTER — Encounter (HOSPITAL_COMMUNITY): Payer: Self-pay

## 2015-02-05 DIAGNOSIS — Z79899 Other long term (current) drug therapy: Secondary | ICD-10-CM | POA: Insufficient documentation

## 2015-02-05 DIAGNOSIS — M179 Osteoarthritis of knee, unspecified: Secondary | ICD-10-CM | POA: Insufficient documentation

## 2015-02-05 DIAGNOSIS — Z01812 Encounter for preprocedural laboratory examination: Secondary | ICD-10-CM | POA: Insufficient documentation

## 2015-02-05 DIAGNOSIS — Z7982 Long term (current) use of aspirin: Secondary | ICD-10-CM | POA: Insufficient documentation

## 2015-02-05 DIAGNOSIS — Z0183 Encounter for blood typing: Secondary | ICD-10-CM | POA: Insufficient documentation

## 2015-02-05 LAB — COMPREHENSIVE METABOLIC PANEL
ALT: 21 U/L (ref 14–54)
AST: 22 U/L (ref 15–41)
Albumin: 4.2 g/dL (ref 3.5–5.0)
Alkaline Phosphatase: 76 U/L (ref 38–126)
Anion gap: 8 (ref 5–15)
BUN: 19 mg/dL (ref 6–20)
CO2: 27 mmol/L (ref 22–32)
Calcium: 9.7 mg/dL (ref 8.9–10.3)
Chloride: 103 mmol/L (ref 101–111)
Creatinine, Ser: 0.61 mg/dL (ref 0.44–1.00)
GFR calc Af Amer: >60 mL/min (ref >60)
GFR calc non Af Amer: >60 mL/min (ref >60)
Glucose, Bld: 86 mg/dL (ref 65–99)
Potassium: 4.3 mmol/L (ref 3.5–5.1)
Sodium: 138 mmol/L (ref 135–145)
Total Bilirubin: 0.2 mg/dL — ABNORMAL LOW (ref 0.3–1.2)
Total Protein: 7.2 g/dL (ref 6.5–8.1)

## 2015-02-05 LAB — CBC
HCT: 39 % (ref 36.0–46.0)
Hemoglobin: 12.1 g/dL (ref 12.0–15.0)
MCH: 28.9 pg (ref 26.0–34.0)
MCHC: 31 g/dL (ref 30.0–36.0)
MCV: 93.1 fL (ref 78.0–100.0)
Platelets: 376 10*3/uL (ref 150–400)
RBC: 4.19 MIL/uL (ref 3.87–5.11)
RDW: 13.9 % (ref 11.5–15.5)
WBC: 7.4 10*3/uL (ref 4.0–10.5)

## 2015-02-05 LAB — URINALYSIS, ROUTINE W REFLEX MICROSCOPIC
Bilirubin Urine: NEGATIVE
Glucose, UA: NEGATIVE mg/dL
Hgb urine dipstick: NEGATIVE
Ketones, ur: NEGATIVE mg/dL
Nitrite: NEGATIVE
Protein, ur: NEGATIVE mg/dL
Specific Gravity, Urine: 1.029 (ref 1.005–1.030)
Urobilinogen, UA: 0.2 mg/dL (ref 0.0–1.0)
pH: 5 (ref 5.0–8.0)

## 2015-02-05 LAB — URINE MICROSCOPIC-ADD ON

## 2015-02-05 LAB — SURGICAL PCR SCREEN
MRSA, PCR: INVALID — AB
Staphylococcus aureus: INVALID — AB

## 2015-02-05 LAB — PROTIME-INR
INR: 1.08 (ref 0.00–1.49)
Prothrombin Time: 14.2 seconds (ref 11.6–15.2)

## 2015-02-05 LAB — APTT: aPTT: 33 seconds (ref 24–37)

## 2015-02-05 LAB — ABO/RH: ABO/RH(D): A POS

## 2015-02-05 NOTE — Progress Notes (Signed)
U/A with micro results faxed via EPIC to Dr Wynelle Link.

## 2015-02-05 NOTE — Progress Notes (Signed)
09/04/2014 on chart  Dr Fransico Him- LOV- 10/2013 EPIC

## 2015-02-07 LAB — MRSA CULTURE

## 2015-02-07 NOTE — H&P (Signed)
TOTAL KNEE ADMISSION H&P  Patient is being admitted for left total knee arthroplasty.  Subjective:  Chief Complaint:left knee pain.  HPI: Sheri [name], 64 y.o. female, has a history of pain and functional disability in the left knee due to arthritis and has failed non-surgical conservative treatments for greater than 12 weeks to includeNSAID's and/or analgesics, corticosteriod injections, viscosupplementation injections and activity modification.  Onset of symptoms was gradual, starting 5 years ago with gradually worsening course since that time. The patient noted no past surgery on the left knee(s).  Patient currently rates pain in the left knee(s) at 7 out of 10 with activity. Patient has night pain, worsening of pain with activity and weight bearing, pain that interferes with activities of daily living, pain with passive range of motion, crepitus and joint swelling.  Patient has evidence of periarticular osteophytes and joint space narrowing by imaging studies.  There is no active infection.  Patient Active Problem List   Diagnosis Date Noted  . Essential hypertension, benign 11/10/2013  . Pure hypercholesterolemia 11/10/2013  . Anemia 08/13/2011   Past Medical History  Diagnosis Date  . Depression   . Obesity   . Hypothyroidism   . Kidney stone   . IBS (irritable bowel syndrome)   . Degenerative disc disease, cervical   . Kissing, osteophytes     Dr Carloyn Manner  . Allergic rhinitis   . Hyperlipidemia   . Menopause   . Osteoarthritis   . Hyperplastic polyps of stomach   . H/O ulcer disease   . Diabetes mellitus without complication     type two  . Diarrhea 04/2012    colon nl, BX NPD; no response to cholestyramin 03-2012 no response to align; transient resp to sucralfate 12-13  . H/O CT scan of abdomen 08/2012    neg; egd bile reflux gastritisand linear eros; transient resp to sucralfate   . Hypertension   . Heart murmur     hx of   . Anxiety   . Mild anemia 06/2011    14%  iron sat,ferritin 14- from Metformin - anemia resolved   . History of blood transfusion     Past Surgical History  Procedure Laterality Date  . Colonoscopy  2/06,04/20/12 repeat 10 years    dr Cristina Gong  . Cholecystectomy    . Cesarean section    . Joint replacement      right knee       Current outpatient prescriptions:  .  acetaminophen (TYLENOL) 650 MG CR tablet, Take 1,300 mg by mouth every 8 (eight) hours as needed for pain., Disp: , Rfl:  .  amLODipine-benazepril (LOTREL) 5-20 MG per capsule, Take 1 capsule by mouth every morning. , Disp: , Rfl:  .  aspirin EC 81 MG tablet, Take 81 mg by mouth daily., Disp: , Rfl:  .  Cholecalciferol (VITAMIN D) 2000 UNITS CAPS, Take 1 capsule by mouth daily., Disp: , Rfl:  .  levothyroxine (SYNTHROID, LEVOTHROID) 125 MCG tablet, Take 125 mcg by mouth daily.  , Disp: , Rfl:  .  lidocaine (LIDODERM) 5 %, Place 1 patch onto the skin daily. , Disp: , Rfl: 0 .  meloxicam (MOBIC) 15 MG tablet, Take 15 mg by mouth daily.  , Disp: , Rfl:  .  OVER THE COUNTER MEDICATION, Take 1 capsule by mouth daily., Disp: , Rfl:  .  pantoprazole (PROTONIX) 40 MG tablet, Take 40 mg by mouth daily. , Disp: , Rfl:  .  sitaGLIPtin (JANUVIA) 100 MG  tablet, Take 100 mg by mouth daily., Disp: , Rfl:  .  traMADol (ULTRAM) 50 MG tablet, Take 100 mg by mouth 3 (three) times daily as needed for moderate pain. , Disp: , Rfl:  .  TURMERIC PO, Take 1 tablet by mouth daily., Disp: , Rfl:   Allergies  Allergen Reactions  . Crestor [Rosuvastatin]     Myalgias   . Niaspan [Niacin Er]     Myalgia   . Premarin [Estrogens Conjugated] Other (See Comments)  . Provera [Medroxyprogesterone Acetate]     "makes pt crazy"    History  Substance Use Topics  . Smoking status: Never Smoker   . Smokeless tobacco: Never Used  . Alcohol Use: No      ROS  Objective:  Physical Exam  Constitutional: She is oriented to person, place, and time. She appears well-developed and  well-nourished. No distress.  HENT:  Head: Normocephalic and atraumatic.  Right Ear: External ear normal.  Left Ear: External ear normal.  Nose: Nose normal.  Mouth/Throat: Oropharynx is clear and moist.  Eyes: Conjunctivae and EOM are normal.  Neck: Normal range of motion. Neck supple.  Cardiovascular: Normal rate, regular rhythm and intact distal pulses.   Murmur heard.  Systolic murmur is present with a grade of 3/6  Respiratory: Effort normal and breath sounds normal.  GI: Soft. Bowel sounds are normal. She exhibits no distension. There is no tenderness.  Musculoskeletal:       Right hip: Normal.       Left hip: Normal.       Right knee: Normal.       Left knee: She exhibits decreased range of motion and swelling. She exhibits no effusion and no erythema. Tenderness found. Medial joint line and lateral joint line tenderness noted.  Evaluation of her left knee shows no effusion. She has a valgus deformity. Range is about 5-125. Moderate crepitus on range of motion. Tenderness medial and lateral with no instability.   Neurological: She is alert and oriented to person, place, and time. She has normal strength and normal reflexes. No sensory deficit.  Skin: No rash noted. She is not diaphoretic. No erythema.  Psychiatric: She has a normal mood and affect. Her behavior is normal.    Vitals  Weight: 205 lb Height: 65in Body Surface Area: 2 m Body Mass Index: 34.11 kg/m  Pulse: 72 (Regular)  BP: 142/86 (Sitting, Left Arm, Standard)  Imaging Review Plain radiographs demonstrate severe degenerative joint disease of the left knee(s). The overall alignment ismild valgus. The bone quality appears to be good for age and reported activity level.  Assessment/Plan:  End stage primary osteoarthritis, left knee   The patient history, physical examination, clinical judgment of the provider and imaging studies are consistent with end stage degenerative joint disease of the left  knee(s) and total knee arthroplasty is deemed medically necessary. The treatment options including medical management, injection therapy arthroscopy and arthroplasty were discussed at length. The risks and benefits of total knee arthroplasty were presented and reviewed. The risks due to aseptic loosening, infection, stiffness, patella tracking problems, thromboembolic complications and other imponderables were discussed. The patient acknowledged the explanation, agreed to proceed with the plan and consent was signed. Patient is being admitted for inpatient treatment for surgery, pain control, PT, OT, prophylactic antibiotics, VTE prophylaxis, progressive ambulation and ADL's and discharge planning. The patient is planning to be discharged home with home health services    Will do outpatient PT at [facility]  in [place] PCP: [name]    Ardeen Jourdain, PA-C

## 2015-02-12 ENCOUNTER — Encounter (HOSPITAL_COMMUNITY)
Admission: RE | Disposition: A | Discharge status: Home Health | Payer: Self-pay | Source: Ambulatory Visit | Attending: Orthopedic Surgery

## 2015-02-12 ENCOUNTER — Inpatient Hospital Stay (HOSPITAL_COMMUNITY): Payer: Medicare Other | Admitting: Anesthesiology

## 2015-02-12 ENCOUNTER — Inpatient Hospital Stay (HOSPITAL_COMMUNITY)
Admission: RE | Admit: 2015-02-12 | Discharge: 2015-02-14 | DRG: 470 | Disposition: A | Discharge status: Home Health | Payer: Medicare Other | Source: Ambulatory Visit | Attending: Orthopedic Surgery | Admitting: Orthopedic Surgery

## 2015-02-12 ENCOUNTER — Encounter (HOSPITAL_COMMUNITY): Payer: Self-pay | Admitting: *Deleted

## 2015-02-12 DIAGNOSIS — E669 Obesity, unspecified: Secondary | ICD-10-CM | POA: Diagnosis present

## 2015-02-12 DIAGNOSIS — M1712 Unilateral primary osteoarthritis, left knee: Secondary | ICD-10-CM | POA: Diagnosis present

## 2015-02-12 DIAGNOSIS — E039 Hypothyroidism, unspecified: Secondary | ICD-10-CM | POA: Diagnosis present

## 2015-02-12 DIAGNOSIS — E119 Type 2 diabetes mellitus without complications: Secondary | ICD-10-CM | POA: Diagnosis present

## 2015-02-12 DIAGNOSIS — Z6834 Body mass index (BMI) 34.0-34.9, adult: Secondary | ICD-10-CM | POA: Diagnosis not present

## 2015-02-12 DIAGNOSIS — Z79899 Other long term (current) drug therapy: Secondary | ICD-10-CM

## 2015-02-12 DIAGNOSIS — M179 Osteoarthritis of knee, unspecified: Secondary | ICD-10-CM | POA: Diagnosis present

## 2015-02-12 DIAGNOSIS — M1710 Unilateral primary osteoarthritis, unspecified knee: Secondary | ICD-10-CM | POA: Diagnosis present

## 2015-02-12 DIAGNOSIS — Z01812 Encounter for preprocedural laboratory examination: Secondary | ICD-10-CM | POA: Diagnosis not present

## 2015-02-12 DIAGNOSIS — Z7982 Long term (current) use of aspirin: Secondary | ICD-10-CM | POA: Diagnosis not present

## 2015-02-12 DIAGNOSIS — I10 Essential (primary) hypertension: Secondary | ICD-10-CM | POA: Diagnosis present

## 2015-02-12 DIAGNOSIS — M25562 Pain in left knee: Secondary | ICD-10-CM | POA: Diagnosis not present

## 2015-02-12 HISTORY — PX: TOTAL KNEE ARTHROPLASTY: SHX125

## 2015-02-12 LAB — TYPE AND SCREEN
ABO/RH(D): A POS
Antibody Screen: NEGATIVE

## 2015-02-12 LAB — GLUCOSE, CAPILLARY
Glucose-Capillary: 126 mg/dL — ABNORMAL HIGH (ref 65–99)
Glucose-Capillary: 85 mg/dL (ref 65–99)
Glucose-Capillary: 154 mg/dL — ABNORMAL HIGH (ref 65–99)
Glucose-Capillary: 185 mg/dL — ABNORMAL HIGH (ref 65–99)

## 2015-02-12 SURGERY — ARTHROPLASTY, KNEE, TOTAL
Anesthesia: Spinal | Site: Knee | Laterality: Left

## 2015-02-12 MED ORDER — ACETAMINOPHEN 500 MG PO TABS
1000.0000 mg | ORAL_TABLET | Freq: Four times a day (QID) | ORAL | Status: AC
  Administered 2015-02-12 – 2015-02-13 (×3): 1000 mg via ORAL
  Filled 2015-02-12 (×2): qty 2

## 2015-02-12 MED ORDER — INSULIN ASPART 100 UNIT/ML ~~LOC~~ SOLN: 0.0000 [IU] | Freq: Three times a day (TID) | SUBCUTANEOUS | Status: DC

## 2015-02-12 MED ORDER — DEXAMETHASONE SODIUM PHOSPHATE 10 MG/ML IJ SOLN
INTRAMUSCULAR | Status: AC
  Filled 2015-02-12: qty 1

## 2015-02-12 MED ORDER — ONDANSETRON HCL 4 MG PO TABS: 4.0000 mg | ORAL_TABLET | Freq: Four times a day (QID) | ORAL | Status: DC | PRN

## 2015-02-12 MED ORDER — AMLODIPINE BESYLATE 5 MG PO TABS
5.0000 mg | ORAL_TABLET | Freq: Every day | ORAL | Status: DC
  Administered 2015-02-13 – 2015-02-14 (×2): 5 mg via ORAL
  Filled 2015-02-12 (×2): qty 1

## 2015-02-12 MED ORDER — CEFAZOLIN SODIUM-DEXTROSE 2-3 GM-% IV SOLR
INTRAVENOUS | Status: AC
  Filled 2015-02-12: qty 50

## 2015-02-12 MED ORDER — BUPIVACAINE HCL (PF) 0.25 % IJ SOLN
INTRAMUSCULAR | Status: AC
  Filled 2015-02-12: qty 30

## 2015-02-12 MED ORDER — TRAMADOL HCL 50 MG PO TABS
50.0000 mg | ORAL_TABLET | Freq: Four times a day (QID) | ORAL | Status: DC | PRN
  Administered 2015-02-12 – 2015-02-14 (×5): 100 mg via ORAL
  Filled 2015-02-12 (×5): qty 2

## 2015-02-12 MED ORDER — PROPOFOL 10 MG/ML IV BOLUS
INTRAVENOUS | Status: AC
  Filled 2015-02-12: qty 20

## 2015-02-12 MED ORDER — PROMETHAZINE HCL 25 MG/ML IJ SOLN: 6.2500 mg | INTRAMUSCULAR | Status: DC | PRN

## 2015-02-12 MED ORDER — METHOCARBAMOL 1000 MG/10ML IJ SOLN
500.0000 mg | Freq: Four times a day (QID) | INTRAVENOUS | Status: DC | PRN
  Administered 2015-02-12: 500 mg via INTRAVENOUS
  Filled 2015-02-12 (×2): qty 5

## 2015-02-12 MED ORDER — AMLODIPINE BESYLATE 5 MG PO TABS
5.0000 mg | ORAL_TABLET | Freq: Once | ORAL | Status: AC
  Administered 2015-02-12: 5 mg via ORAL
  Filled 2015-02-12: qty 1

## 2015-02-12 MED ORDER — PHENOL 1.4 % MT LIQD: 1.0000 | OROMUCOSAL | Status: DC | PRN

## 2015-02-12 MED ORDER — LEVOTHYROXINE SODIUM 125 MCG PO TABS
125.0000 ug | ORAL_TABLET | Freq: Every day | ORAL | Status: DC
  Administered 2015-02-14: 125 ug via ORAL
  Filled 2015-02-12 (×3): qty 1

## 2015-02-12 MED ORDER — LINAGLIPTIN 5 MG PO TABS
5.0000 mg | ORAL_TABLET | Freq: Every day | ORAL | Status: DC
  Administered 2015-02-13: 5 mg via ORAL
  Filled 2015-02-12 (×2): qty 1

## 2015-02-12 MED ORDER — BUPIVACAINE LIPOSOME 1.3 % IJ SUSP
20.0000 mL | Freq: Once | INTRAMUSCULAR | Status: DC
  Filled 2015-02-12: qty 20

## 2015-02-12 MED ORDER — HYDROMORPHONE HCL 1 MG/ML IJ SOLN: 0.2500 mg | INTRAMUSCULAR | Status: DC | PRN

## 2015-02-12 MED ORDER — PROPOFOL INFUSION 10 MG/ML OPTIME
INTRAVENOUS | Status: DC | PRN
  Administered 2015-02-12: 100 ug/kg/min via INTRAVENOUS

## 2015-02-12 MED ORDER — DIPHENHYDRAMINE HCL 12.5 MG/5ML PO ELIX: 12.5000 mg | ORAL_SOLUTION | ORAL | Status: DC | PRN

## 2015-02-12 MED ORDER — METOCLOPRAMIDE HCL 10 MG PO TABS: 5.0000 mg | ORAL_TABLET | Freq: Three times a day (TID) | ORAL | Status: DC | PRN

## 2015-02-12 MED ORDER — DEXAMETHASONE SODIUM PHOSPHATE 10 MG/ML IJ SOLN
10.0000 mg | Freq: Once | INTRAMUSCULAR | Status: AC
  Administered 2015-02-13: 10 mg via INTRAVENOUS
  Filled 2015-02-12: qty 1

## 2015-02-12 MED ORDER — KETOROLAC TROMETHAMINE 15 MG/ML IJ SOLN
7.5000 mg | Freq: Four times a day (QID) | INTRAMUSCULAR | Status: AC | PRN
  Administered 2015-02-12 (×2): 7.5 mg via INTRAVENOUS
  Filled 2015-02-12 (×2): qty 1

## 2015-02-12 MED ORDER — BISACODYL 10 MG RE SUPP: 10.0000 mg | Freq: Every day | RECTAL | Status: DC | PRN

## 2015-02-12 MED ORDER — ONDANSETRON HCL 4 MG/2ML IJ SOLN
INTRAMUSCULAR | Status: DC | PRN
Start: 2015-02-12 — End: 2015-02-12
  Administered 2015-02-12: 4 mg via INTRAVENOUS

## 2015-02-12 MED ORDER — FENTANYL CITRATE (PF) 100 MCG/2ML IJ SOLN
INTRAMUSCULAR | Status: AC
  Filled 2015-02-12: qty 2

## 2015-02-12 MED ORDER — HYDROMORPHONE HCL 1 MG/ML IJ SOLN
1.0000 mg | INTRAMUSCULAR | Status: DC | PRN
  Administered 2015-02-12 – 2015-02-13 (×4): 1 mg via INTRAVENOUS
  Filled 2015-02-12 (×4): qty 1

## 2015-02-12 MED ORDER — MIDAZOLAM HCL 2 MG/2ML IJ SOLN
INTRAMUSCULAR | Status: AC
  Filled 2015-02-12: qty 2

## 2015-02-12 MED ORDER — MIDAZOLAM HCL 5 MG/5ML IJ SOLN
INTRAMUSCULAR | Status: DC | PRN
  Administered 2015-02-12: 2 mg via INTRAVENOUS

## 2015-02-12 MED ORDER — BENAZEPRIL HCL 20 MG PO TABS
20.0000 mg | ORAL_TABLET | Freq: Every day | ORAL | Status: DC
  Administered 2015-02-13 – 2015-02-14 (×2): 20 mg via ORAL
  Filled 2015-02-12 (×2): qty 1

## 2015-02-12 MED ORDER — STERILE WATER FOR IRRIGATION IR SOLN
Status: DC | PRN
  Administered 2015-02-12: 1500 mL

## 2015-02-12 MED ORDER — SODIUM CHLORIDE 0.9 % IV SOLN
INTRAVENOUS | Status: DC
  Administered 2015-02-12 – 2015-02-13 (×2): via INTRAVENOUS

## 2015-02-12 MED ORDER — RIVAROXABAN 10 MG PO TABS
10.0000 mg | ORAL_TABLET | Freq: Every day | ORAL | Status: DC
  Administered 2015-02-13 – 2015-02-14 (×2): 10 mg via ORAL
  Filled 2015-02-12 (×4): qty 1

## 2015-02-12 MED ORDER — SODIUM CHLORIDE 0.9 % IR SOLN
Status: DC | PRN
  Administered 2015-02-12: 1000 mL

## 2015-02-12 MED ORDER — ACETAMINOPHEN 10 MG/ML IV SOLN
INTRAVENOUS | Status: AC
  Filled 2015-02-12: qty 100

## 2015-02-12 MED ORDER — SODIUM CHLORIDE 0.9 % IJ SOLN
INTRAMUSCULAR | Status: DC | PRN
  Administered 2015-02-12: 30 mL

## 2015-02-12 MED ORDER — OXYCODONE HCL 5 MG PO TABS
5.0000 mg | ORAL_TABLET | ORAL | Status: DC | PRN
  Administered 2015-02-12 – 2015-02-13 (×3): 10 mg via ORAL
  Filled 2015-02-12 (×3): qty 2

## 2015-02-12 MED ORDER — SODIUM CHLORIDE 0.9 % IJ SOLN
INTRAMUSCULAR | Status: AC
  Filled 2015-02-12: qty 50

## 2015-02-12 MED ORDER — DOCUSATE SODIUM 100 MG PO CAPS
100.0000 mg | ORAL_CAPSULE | Freq: Two times a day (BID) | ORAL | Status: DC
  Administered 2015-02-12 – 2015-02-14 (×4): 100 mg via ORAL

## 2015-02-12 MED ORDER — ONDANSETRON HCL 4 MG/2ML IJ SOLN
INTRAMUSCULAR | Status: AC
Start: 2015-02-12 — End: 2015-02-12
  Filled 2015-02-12: qty 2

## 2015-02-12 MED ORDER — TRANEXAMIC ACID 1000 MG/10ML IV SOLN: 1000.0000 mg | INTRAVENOUS | Status: DC

## 2015-02-12 MED ORDER — TRANEXAMIC ACID 1000 MG/10ML IV SOLN
1000.0000 mg | INTRAVENOUS | Status: AC
  Administered 2015-02-12: 1000 mg via INTRAVENOUS
  Filled 2015-02-12: qty 10

## 2015-02-12 MED ORDER — BUPIVACAINE HCL 0.25 % IJ SOLN
INTRAMUSCULAR | Status: DC | PRN
  Administered 2015-02-12: 30 mL

## 2015-02-12 MED ORDER — CHLORHEXIDINE GLUCONATE 4 % EX LIQD: 60.0000 mL | Freq: Once | CUTANEOUS | Status: DC

## 2015-02-12 MED ORDER — 0.9 % SODIUM CHLORIDE (POUR BTL) OPTIME
TOPICAL | Status: DC | PRN
  Administered 2015-02-12: 1000 mL

## 2015-02-12 MED ORDER — CEFAZOLIN SODIUM-DEXTROSE 2-3 GM-% IV SOLR
2.0000 g | INTRAVENOUS | Status: AC
  Administered 2015-02-12: 2 g via INTRAVENOUS

## 2015-02-12 MED ORDER — ACETAMINOPHEN 650 MG RE SUPP: 650.0000 mg | Freq: Four times a day (QID) | RECTAL | Status: DC | PRN

## 2015-02-12 MED ORDER — FLEET ENEMA 7-19 GM/118ML RE ENEM: 1.0000 | ENEMA | Freq: Once | RECTAL | Status: AC | PRN

## 2015-02-12 MED ORDER — ONDANSETRON HCL 4 MG/2ML IJ SOLN: 4.0000 mg | Freq: Four times a day (QID) | INTRAMUSCULAR | Status: DC | PRN

## 2015-02-12 MED ORDER — FENTANYL CITRATE (PF) 250 MCG/5ML IJ SOLN
INTRAMUSCULAR | Status: DC | PRN
  Administered 2015-02-12: 100 ug via INTRAVENOUS

## 2015-02-12 MED ORDER — ACETAMINOPHEN 325 MG PO TABS
650.0000 mg | ORAL_TABLET | Freq: Four times a day (QID) | ORAL | Status: DC | PRN
  Administered 2015-02-14: 650 mg via ORAL
  Filled 2015-02-12: qty 2

## 2015-02-12 MED ORDER — LACTATED RINGERS IV SOLN
INTRAVENOUS | Status: DC | PRN
  Administered 2015-02-12 (×2): via INTRAVENOUS

## 2015-02-12 MED ORDER — METHOCARBAMOL 500 MG PO TABS
500.0000 mg | ORAL_TABLET | Freq: Four times a day (QID) | ORAL | Status: DC | PRN
  Administered 2015-02-12 – 2015-02-14 (×7): 500 mg via ORAL
  Filled 2015-02-12 (×7): qty 1

## 2015-02-12 MED ORDER — METOCLOPRAMIDE HCL 5 MG/ML IJ SOLN: 5.0000 mg | Freq: Three times a day (TID) | INTRAMUSCULAR | Status: DC | PRN

## 2015-02-12 MED ORDER — AMLODIPINE BESY-BENAZEPRIL HCL 5-20 MG PO CAPS: 1.0000 | ORAL_CAPSULE | Freq: Every morning | ORAL | Status: DC

## 2015-02-12 MED ORDER — ACETAMINOPHEN 10 MG/ML IV SOLN
1000.0000 mg | Freq: Once | INTRAVENOUS | Status: AC
Start: 2015-02-12 — End: 2015-02-12
  Administered 2015-02-12: 1000 mg via INTRAVENOUS
  Filled 2015-02-12: qty 100

## 2015-02-12 MED ORDER — MENTHOL 3 MG MT LOZG: 1.0000 | LOZENGE | OROMUCOSAL | Status: DC | PRN

## 2015-02-12 MED ORDER — POLYETHYLENE GLYCOL 3350 17 G PO PACK
17.0000 g | PACK | Freq: Every day | ORAL | Status: DC | PRN
  Administered 2015-02-12 – 2015-02-13 (×2): 17 g via ORAL
  Filled 2015-02-12 (×2): qty 1

## 2015-02-12 MED ORDER — BUPIVACAINE LIPOSOME 1.3 % IJ SUSP
INTRAMUSCULAR | Status: DC | PRN
  Administered 2015-02-12: 20 mL

## 2015-02-12 MED ORDER — BUPIVACAINE IN DEXTROSE 0.75-8.25 % IT SOLN
INTRATHECAL | Status: DC | PRN
  Administered 2015-02-12: 2 mL via INTRATHECAL

## 2015-02-12 MED ORDER — DEXAMETHASONE SODIUM PHOSPHATE 10 MG/ML IJ SOLN
10.0000 mg | Freq: Once | INTRAMUSCULAR | Status: AC
  Administered 2015-02-12: 10 mg via INTRAVENOUS

## 2015-02-12 MED ORDER — PANTOPRAZOLE SODIUM 40 MG PO TBEC
40.0000 mg | DELAYED_RELEASE_TABLET | Freq: Every day | ORAL | Status: DC
Start: 2015-02-13 — End: 2015-02-14
  Administered 2015-02-13 – 2015-02-14 (×2): 40 mg via ORAL
  Filled 2015-02-12 (×3): qty 1

## 2015-02-12 MED ORDER — CEFAZOLIN SODIUM-DEXTROSE 2-3 GM-% IV SOLR
2.0000 g | Freq: Four times a day (QID) | INTRAVENOUS | Status: AC
  Administered 2015-02-12 (×2): 2 g via INTRAVENOUS
  Filled 2015-02-12 (×3): qty 50

## 2015-02-12 MED ORDER — SODIUM CHLORIDE 0.9 % IV SOLN: INTRAVENOUS | Status: DC

## 2015-02-12 SURGICAL SUPPLY — 67 items
BAG DECANTER FOR FLEXI CONT (MISCELLANEOUS) ×2 IMPLANT
BAG SPEC THK2 15X12 ZIP CLS (MISCELLANEOUS) ×1
BAG ZIPLOCK 12X15 (MISCELLANEOUS) ×2 IMPLANT
BANDAGE ELASTIC 6 VELCRO ST LF (GAUZE/BANDAGES/DRESSINGS) ×2 IMPLANT
BANDAGE ESMARK 6X9 LF (GAUZE/BANDAGES/DRESSINGS) ×1 IMPLANT
BLADE SAG 18X100X1.27 (BLADE) ×2 IMPLANT
BLADE SAW SGTL 11.0X1.19X90.0M (BLADE) ×2 IMPLANT
BNDG CMPR 9X6 STRL LF SNTH (GAUZE/BANDAGES/DRESSINGS) ×1
BNDG ESMARK 6X9 LF (GAUZE/BANDAGES/DRESSINGS) ×2
BOWL SMART MIX CTS (DISPOSABLE) ×2 IMPLANT
CAP KNEE TOTAL 3 SIGMA ×1 IMPLANT
CEMENT HV SMART SET (Cement) ×4 IMPLANT
CLOSURE STERI-STRIP 1/4X4 (GAUZE/BANDAGES/DRESSINGS) ×1 IMPLANT
CUFF TOURN SGL QUICK 34 (TOURNIQUET CUFF) ×2
CUFF TRNQT CYL 34X4X40X1 (TOURNIQUET CUFF) ×1 IMPLANT
DECANTER SPIKE VIAL GLASS SM (MISCELLANEOUS) ×2 IMPLANT
DRAPE EXTREMITY T 121X128X90 (DRAPE) ×2 IMPLANT
DRAPE POUCH INSTRU U-SHP 10X18 (DRAPES) ×2 IMPLANT
DRAPE U-SHAPE 47X51 STRL (DRAPES) ×2 IMPLANT
DRSG ADAPTIC 3X8 NADH LF (GAUZE/BANDAGES/DRESSINGS) ×2 IMPLANT
DRSG PAD ABDOMINAL 8X10 ST (GAUZE/BANDAGES/DRESSINGS) ×1 IMPLANT
DURAPREP 26ML APPLICATOR (WOUND CARE) ×2 IMPLANT
ELECT REM PT RETURN 9FT ADLT (ELECTROSURGICAL) ×2
ELECTRODE REM PT RTRN 9FT ADLT (ELECTROSURGICAL) ×1 IMPLANT
EVACUATOR 1/8 PVC DRAIN (DRAIN) ×2 IMPLANT
FACESHIELD WRAPAROUND (MASK) ×10 IMPLANT
FACESHIELD WRAPAROUND OR TEAM (MASK) ×5 IMPLANT
GAUZE SPONGE 4X4 12PLY STRL (GAUZE/BANDAGES/DRESSINGS) ×2 IMPLANT
GLOVE BIO SURGEON STRL SZ7.5 (GLOVE) IMPLANT
GLOVE BIO SURGEON STRL SZ8 (GLOVE) ×2 IMPLANT
GLOVE BIOGEL PI IND STRL 6.5 (GLOVE) IMPLANT
GLOVE BIOGEL PI IND STRL 8 (GLOVE) ×1 IMPLANT
GLOVE BIOGEL PI INDICATOR 6.5 (GLOVE)
GLOVE BIOGEL PI INDICATOR 8 (GLOVE) ×1
GLOVE SURG SS PI 6.5 STRL IVOR (GLOVE) ×1 IMPLANT
GOWN STRL REUS W/TWL LRG LVL3 (GOWN DISPOSABLE) ×2 IMPLANT
GOWN STRL REUS W/TWL XL LVL3 (GOWN DISPOSABLE) ×1 IMPLANT
HANDPIECE INTERPULSE COAX TIP (DISPOSABLE) ×2
IMMOBILIZER KNEE 20 (SOFTGOODS) ×1 IMPLANT
IMMOBILIZER KNEE 20 THIGH 36 (SOFTGOODS) ×1 IMPLANT
KIT BASIN OR (CUSTOM PROCEDURE TRAY) ×2 IMPLANT
MANIFOLD NEPTUNE II (INSTRUMENTS) ×2 IMPLANT
NDL SAFETY ECLIPSE 18X1.5 (NEEDLE) ×2 IMPLANT
NEEDLE HYPO 18GX1.5 SHARP (NEEDLE) ×4
NS IRRIG 1000ML POUR BTL (IV SOLUTION) ×2 IMPLANT
PACK TOTAL JOINT (CUSTOM PROCEDURE TRAY) ×2 IMPLANT
PAD ABD 8X10 STRL (GAUZE/BANDAGES/DRESSINGS) ×1 IMPLANT
PADDING CAST ABS 6INX4YD NS (CAST SUPPLIES) ×1
PADDING CAST ABS COTTON 6X4 NS (CAST SUPPLIES) IMPLANT
PADDING CAST COTTON 6X4 STRL (CAST SUPPLIES) ×5 IMPLANT
PEN SKIN MARKING BROAD (MISCELLANEOUS) ×2 IMPLANT
POSITIONER SURGICAL ARM (MISCELLANEOUS) ×2 IMPLANT
SET HNDPC FAN SPRY TIP SCT (DISPOSABLE) ×1 IMPLANT
STRIP CLOSURE SKIN 1/2X4 (GAUZE/BANDAGES/DRESSINGS) ×3 IMPLANT
SUCTION FRAZIER 12FR DISP (SUCTIONS) ×2 IMPLANT
SUT MNCRL AB 4-0 PS2 18 (SUTURE) ×2 IMPLANT
SUT VIC AB 2-0 CT1 27 (SUTURE) ×6
SUT VIC AB 2-0 CT1 TAPERPNT 27 (SUTURE) ×3 IMPLANT
SUT VLOC 180 0 24IN GS25 (SUTURE) ×2 IMPLANT
SYR 20CC LL (SYRINGE) ×2 IMPLANT
SYR 50ML LL SCALE MARK (SYRINGE) ×2 IMPLANT
TOWEL OR 17X26 10 PK STRL BLUE (TOWEL DISPOSABLE) ×2 IMPLANT
TOWEL OR NON WOVEN STRL DISP B (DISPOSABLE) ×1 IMPLANT
TRAY FOLEY W/METER SILVER 14FR (SET/KITS/TRAYS/PACK) ×2 IMPLANT
WATER STERILE IRR 1500ML POUR (IV SOLUTION) ×2 IMPLANT
WRAP KNEE MAXI GEL POST OP (GAUZE/BANDAGES/DRESSINGS) ×2 IMPLANT
YANKAUER SUCT BULB TIP 10FT TU (MISCELLANEOUS) ×2 IMPLANT

## 2015-02-12 NOTE — Op Note (Signed)
Pre-operative diagnosis- Osteoarthritis  Left knee(s)  Post-operative diagnosis- Osteoarthritis Left knee(s)  Procedure-  Left  Total Knee Arthroplasty  Surgeon- Dione Plover. Mohamud Mrozek, MD  Assistant- Ardeen Jourdain, PA-C   Anesthesia-  Spinal  EBL-* No blood loss amount entered *   Drains Hemovac  Tourniquet time-  Total Tourniquet Time Documented: Thigh (Left) - 32 minutes Total: Thigh (Left) - 32 minutes     Complications- None  Condition-PACU - hemodynamically stable.   Brief Clinical Note  Sheri [name] is a 64 y.o. year old female with end stage OA of her left knee with progressively worsening pain and dysfunction. She has constant pain, with activity and at rest and significant functional deficits with difficulties even with ADLs. She has had extensive non-op management including analgesics, injections of cortisone and viscosupplements, and home exercise program, but remains in significant pain with significant dysfunction. Radiographs show bone on bone arthritis medial and patellofemoral. She presents now for left Total Knee Arthroplasty.    Procedure in detail---   The patient is brought into the operating room and positioned supine on the operating table. After successful administration of  Spinal,   a tourniquet is placed high on the  Left thigh(s) and the lower extremity is prepped and draped in the usual sterile fashion. Time out is performed by the operating team and then the  Left lower extremity is wrapped in Esmarch, knee flexed and the tourniquet inflated to 300 mmHg.       A midline incision is made with a ten blade through the subcutaneous tissue to the level of the extensor mechanism. A fresh blade is used to make a medial parapatellar arthrotomy. Soft tissue over the proximal medial tibia is subperiosteally elevated to the joint line with a knife and into the semimembranosus bursa with a Cobb elevator. Soft tissue over the proximal lateral tibia is elevated with  attention being paid to avoiding the patellar tendon on the tibial tubercle. The patella is everted, knee flexed 90 degrees and the ACL and PCL are removed. Findings are bone on bone medial and patellofemoral with large global osteophytes.        The drill is used to create a starting hole in the distal femur and the canal is thoroughly irrigated with sterile saline to remove the fatty contents. The 5 degree Left  valgus alignment guide is placed into the femoral canal and the distal femoral cutting block is pinned to remove 10 mm off the distal femur. Resection is made with an oscillating saw.      The tibia is subluxed forward and the menisci are removed. The extramedullary alignment guide is placed referencing proximally at the medial aspect of the tibial tubercle and distally along the second metatarsal axis and tibial crest. The block is pinned to remove 75mm off the more deficient medial  side. Resection is made with an oscillating saw. Size 2.5is the most appropriate size for the tibia and the proximal tibia is prepared with the modular drill and keel punch for that size.      The femoral sizing guide is placed and size 3 is most appropriate. Rotation is marked off the epicondylar axis and confirmed by creating a rectangular flexion gap at 90 degrees. The size 3 cutting block is pinned in this rotation and the anterior, posterior and chamfer cuts are made with the oscillating saw. The intercondylar block is then placed and that cut is made.      Trial size 2.5 tibial component,  trial size 3 posterior stabilized femur and a 10  mm posterior stabilized rotating platform insert trial is placed. Full extension is achieved with excellent varus/valgus and anterior/posterior balance throughout full range of motion. The patella is everted and thickness measured to be 22  mm. Free hand resection is taken to 12 mm, a 35 template is placed, lug holes are drilled, trial patella is placed, and it tracks normally.  Osteophytes are removed off the posterior femur with the trial in place. All trials are removed and the cut bone surfaces prepared with pulsatile lavage. Cement is mixed and once ready for implantation, the size 2.5 tibial implant, size  3 posterior stabilized femoral component, and the size 35 patella are cemented in place and the patella is held with the clamp. The trial insert is placed and the knee held in full extension. The Exparel (20 ml mixed with 30 ml saline) and .25% Bupivicaine, are injected into the extensor mechanism, posterior capsule, medial and lateral gutters and subcutaneous tissues.  All extruded cement is removed and once the cement is hard the permanent 10 mm posterior stabilized rotating platform insert is placed into the tibial tray.      The wound is copiously irrigated with saline solution and the extensor mechanism closed over a hemovac drain with #1 V-loc suture. The tourniquet is released for a total tourniquet time of 32  minutes. Flexion against gravity is 140 degrees and the patella tracks normally. Subcutaneous tissue is closed with 2.0 vicryl and subcuticular with running 4.0 Monocryl. The incision is cleaned and dried and steri-strips and a bulky sterile dressing are applied. The limb is placed into a knee immobilizer and the patient is awakened and transported to recovery in stable condition.      Please note that a surgical assistant was a medical necessity for this procedure in order to perform it in a safe and expeditious manner. Surgical assistant was necessary to retract the ligaments and vital neurovascular structures to prevent injury to them and also necessary for proper positioning of the limb to allow for anatomic placement of the prosthesis.   Dione Plover Charice Zuno, MD    02/12/2015, 1:17 PM

## 2015-02-12 NOTE — Progress Notes (Signed)
Orthopedic Tech Progress Note Patient Details:  ECHO PROPP Dec 28, 1950 [id number]  CPM Left Knee CPM Left Knee: On Left Knee Flexion (Degrees): 40 Left Knee Extension (Degrees): 10 Additional Comments: Trapeze bar   Irish Elders 02/12/2015, 2:03 PM

## 2015-02-12 NOTE — Interval H&P Note (Signed)
History and Physical Interval Note:  02/12/2015 11:21 AM  Sheri [name]  has presented today for surgery, with the diagnosis of OA LEFT KNEE   The various methods of treatment have been discussed with the patient and family. After consideration of risks, benefits and other options for treatment, the patient has consented to  Procedure(s): LEFT TOTAL KNEE ARTHROPLASTY (Left) as a surgical intervention .  The patient's history has been reviewed, patient examined, no change in status, stable for surgery.  I have reviewed the patient's chart and labs.  Questions were answered to the patient's satisfaction.     Gearlean Alf

## 2015-02-12 NOTE — Anesthesia Postprocedure Evaluation (Signed)
  Anesthesia Post-op Note  Patient: Sheri [name]  Procedure(s) Performed: Procedure(s): LEFT TOTAL KNEE ARTHROPLASTY (Left)  Patient Location: PACU  Anesthesia Type:Spinal  Level of Consciousness: awake  Airway and Oxygen Therapy: Patient Spontanous Breathing  Post-op Pain: none  Post-op Assessment: Post-op Vital signs reviewed LLE Motor Response: Purposeful movement LLE Sensation: Numbness RLE Motor Response: Purposeful movement RLE Sensation: Decreased L Sensory Level: L2-Upper inner thigh, upper buttock R Sensory Level: L2-Upper inner thigh, upper buttock  Post-op Vital Signs: Reviewed  Last Vitals:  Filed Vitals:   02/12/15 1701  BP: 138/81  Pulse: 74  Temp: 37.1 C  Resp: 16    Complications: No apparent anesthesia complications

## 2015-02-12 NOTE — Anesthesia Preprocedure Evaluation (Signed)
Anesthesia Evaluation  Patient identified by MRN, date of birth, ID band Patient awake    Reviewed: Allergy & Precautions, NPO status , Patient's Chart, lab work & pertinent test results  Airway Mallampati: II  TM Distance: >3 FB Neck ROM: Full    Dental   Pulmonary neg pulmonary ROS,  breath sounds clear to auscultation        Cardiovascular hypertension, Rhythm:Regular Rate:Normal     Neuro/Psych Anxiety Depression negative neurological ROS     GI/Hepatic negative GI ROS, Neg liver ROS,   Endo/Other  diabetesHypothyroidism   Renal/GU Renal disease     Musculoskeletal   Abdominal   Peds  Hematology   Anesthesia Other Findings   Reproductive/Obstetrics                             Anesthesia Physical Anesthesia Plan  ASA: III  Anesthesia Plan: Spinal   Post-op Pain Management:    Induction:   Airway Management Planned: Simple Face Mask  Additional Equipment:   Intra-op Plan:   Post-operative Plan:   Informed Consent: I have reviewed the patients History and Physical, chart, labs and discussed the procedure including the risks, benefits and alternatives for the proposed anesthesia with the patient or authorized representative who has indicated his/her understanding and acceptance.   Dental advisory given  Plan Discussed with: CRNA and Anesthesiologist  Anesthesia Plan Comments:         Anesthesia Quick Evaluation

## 2015-02-12 NOTE — Transfer of Care (Signed)
Immediate Anesthesia Transfer of Care Note  Patient: Sheri [name]  Procedure(s) Performed: Procedure(s): LEFT TOTAL KNEE ARTHROPLASTY (Left)  Patient Location: PACU  Anesthesia Type:MAC and Spinal  Level of Consciousness: awake, alert  and oriented  Airway & Oxygen Therapy: Patient Spontanous Breathing and Patient connected to face mask oxygen  Post-op Assessment: Report given to RN and Post -op Vital signs reviewed and stable  Post vital signs: Reviewed and stable  Last Vitals:  Filed Vitals:   02/12/15 0932  BP: 153/84  Pulse: 85  Temp: 37.2 C  Resp: 18    Complications: No apparent anesthesia complications

## 2015-02-12 NOTE — Anesthesia Procedure Notes (Signed)
Spinal Patient location during procedure: OR Start time: 02/12/2015 12:17 PM Staffing Anesthesiologist: Finis Bud Resident/CRNA: [place] Trena Dunavan C Performed by: resident/CRNA  Preanesthetic Checklist Completed: patient identified, site marked, surgical consent, pre-op evaluation, timeout performed, IV checked, risks and benefits discussed and monitors and equipment checked Spinal Block Patient position: sitting Prep: Betadine Patient monitoring: heart rate, cardiac monitor, continuous pulse ox and blood pressure Approach: right paramedian Location: L3-4 Injection technique: single-shot Needle Needle type: Sprotte  Needle gauge: 24 G Assessment Sensory level: T6

## 2015-02-13 ENCOUNTER — Encounter (HOSPITAL_COMMUNITY): Payer: Self-pay | Admitting: Orthopedic Surgery

## 2015-02-13 LAB — BASIC METABOLIC PANEL
Anion gap: 8 (ref 5–15)
BUN: 16 mg/dL (ref 6–20)
CO2: 25 mmol/L (ref 22–32)
Calcium: 9.2 mg/dL (ref 8.9–10.3)
Chloride: 105 mmol/L (ref 101–111)
Creatinine, Ser: 0.63 mg/dL (ref 0.44–1.00)
GFR calc Af Amer: >60 mL/min (ref >60)
GFR calc non Af Amer: >60 mL/min (ref >60)
Glucose, Bld: 198 mg/dL — ABNORMAL HIGH (ref 65–99)
Potassium: 4.6 mmol/L (ref 3.5–5.1)
Sodium: 138 mmol/L (ref 135–145)

## 2015-02-13 LAB — GLUCOSE, CAPILLARY
Glucose-Capillary: 137 mg/dL — ABNORMAL HIGH (ref 65–99)
Glucose-Capillary: 139 mg/dL — ABNORMAL HIGH (ref 65–99)
Glucose-Capillary: 235 mg/dL — ABNORMAL HIGH (ref 65–99)
Glucose-Capillary: 156 mg/dL — ABNORMAL HIGH (ref 65–99)

## 2015-02-13 LAB — CBC
HCT: 34.6 % — ABNORMAL LOW (ref 36.0–46.0)
Hemoglobin: 11.1 g/dL — ABNORMAL LOW (ref 12.0–15.0)
MCH: 29.4 pg (ref 26.0–34.0)
MCHC: 32.1 g/dL (ref 30.0–36.0)
MCV: 91.8 fL (ref 78.0–100.0)
Platelets: 394 10*3/uL (ref 150–400)
RBC: 3.77 MIL/uL — ABNORMAL LOW (ref 3.87–5.11)
RDW: 13.9 % (ref 11.5–15.5)
WBC: 11.6 10*3/uL — ABNORMAL HIGH (ref 4.0–10.5)

## 2015-02-13 MED ORDER — RIVAROXABAN 10 MG PO TABS: 10.0000 mg | ORAL_TABLET | Freq: Every day | ORAL | Status: DC

## 2015-02-13 MED ORDER — HYDROMORPHONE HCL 2 MG PO TABS
2.0000 mg | ORAL_TABLET | ORAL | Status: DC | PRN
  Administered 2015-02-13 – 2015-02-14 (×9): 4 mg via ORAL
  Filled 2015-02-13 (×10): qty 2

## 2015-02-13 MED ORDER — METHOCARBAMOL 500 MG PO TABS: 500.0000 mg | ORAL_TABLET | Freq: Four times a day (QID) | ORAL | Status: DC | PRN

## 2015-02-13 MED ORDER — TRAMADOL HCL 50 MG PO TABS: 50.0000 mg | ORAL_TABLET | Freq: Four times a day (QID) | ORAL | Status: DC | PRN

## 2015-02-13 MED ORDER — HYDROMORPHONE HCL 2 MG PO TABS: 2.0000 mg | ORAL_TABLET | ORAL | Status: DC | PRN

## 2015-02-13 NOTE — Care Management Note (Signed)
Case Management Note  Patient Details  Name: Sheri [name] MRN: [id number] Date of Birth: [birth year]  Subjective/Objective:                    Action/Plan:   Expected Discharge Date:                  Expected Discharge Plan:  [place]  In-House Referral:  NA  Discharge planning Services  CM Consult  Post Acute Care Choice:  Durable Medical Equipment, Home Health Choice offered to:  Patient  DME Arranged:  Walker rolling, 3-N-1 DME Agency:  [place]:  [facility] Agency:  [place]  Status of Service:  In process, will continue to follow  Medicare Important Message Given:    Date Medicare IM Given:    Medicare IM give by:    Date Additional Medicare IM Given:    Additional Medicare Important Message give by:     If discussed at [place] of Stay Meetings, dates discussed:    Additional Comments:  Leeroy Cha, RN 02/13/2015, 10:58 AM

## 2015-02-13 NOTE — Progress Notes (Signed)
   02/13/15 1600  PT Visit Information  Last PT Received On 02/13/15  Assistance Needed +1  History of Present Illness s/p L TKA; PMHx:  R TKA  PT Time Calculation  PT Start Time (ACUTE ONLY) 1507  PT Stop Time (ACUTE ONLY) 1528  PT Time Calculation (min) (ACUTE ONLY) 21 min  Subjective Data  Patient Stated Goal regain independence  Precautions  Precautions Knee  Precaution Comments pt prefers to use KI, states it helps pain control  Required Braces or Orthoses Knee Immobilizer - Left  Knee Immobilizer - Left Discontinue once straight leg raise with < 10 degree lag  Restrictions  Other Position/Activity Restrictions WBAT  Pain Assessment  Pain Assessment 0-10  Pain Score 5  Pain Location L knee  Pain Descriptors / Indicators Sore  Pain Intervention(s) Monitored during session;Limited activity within patient's tolerance;Ice applied;Repositioned;Premedicated before session  Cognition  Arousal/Alertness Awake/alert  Behavior During Therapy WFL for tasks assessed/performed  Overall Cognitive Status Within Functional Limits for tasks assessed  Bed Mobility  Overal bed mobility Needs Assistance  Bed Mobility Sit to Supine  Sit to supine Min assist  General bed mobility comments min with LLE, cues for technique  Transfers  Overall transfer level Needs assistance  Equipment used Rolling walker (2 wheeled)  Transfers Sit to/from [employer] Transfers  Sit to Stand Min assist  Stand pivot transfers Min assist  General transfer comment cues for hand placement  Ambulation/Gait  Ambulation/Gait assistance Min guard  Ambulation Distance (Feet) 65 Feet  Assistive device Rolling walker (2 wheeled)  General Gait Details cues for RW position and sequence  Gait Pattern/deviations Step-to pattern  Exercises  Exercises Total Joint  Total Joint Exercises  Ankle Circles/Pumps AROM;5 reps;Both  Quad Sets Both;AROM;10 reps  Heel Slides AAROM;Left;10 reps  Hip ABduction/ADduction  AAROM;Left;10 reps  Straight Leg Raises AAROM;Left;10 reps  PT - End of Session  Equipment Utilized During Treatment Gait belt;Left knee immobilizer  Activity Tolerance Patient tolerated treatment well  Patient left with call bell/phone within reach;in bed;with family/visitor present  Nurse Communication Mobility status  PT - Assessment/Plan  PT Plan Current plan remains appropriate  PT Frequency (ACUTE ONLY) 7X/week  Follow Up Recommendations Home health PT  PT equipment None recommended by PT  PT Goal Progression  Progress towards PT goals Progressing toward goals  Acute Rehab PT Goals  PT Goal Formulation With patient  Time For Goal Achievement 02/20/15  Potential to Achieve Goals Good  PT General Charges  $$ ACUTE PT VISIT 1 Procedure  PT Treatments  $Therapeutic Exercise 8-22 mins

## 2015-02-13 NOTE — Progress Notes (Signed)
OT Cancellation Note  Patient Details Name: Sheri [name] MRN: [id number] DOB: [birth year]   Cancelled Treatment:    Reason Eval/Treat Not Completed: Pain limiting ability to participate.  Will check back later.  Davine Sweney 02/13/2015, 11:55 AM  Lesle Chris, OTR/L [phone] 02/13/2015

## 2015-02-13 NOTE — Progress Notes (Signed)
   Subjective: 1 Day Post-Op Procedure(s) (LRB): LEFT TOTAL KNEE ARTHROPLASTY (Left) Patient reports pain as moderate.   Plan is to go Home after hospital stay.  Objective: Vital signs in last 24 hours: Temp:  [97.8 F (36.6 C)-99 F (37.2 C)] 97.9 F (36.6 C) (06/14 0430) Pulse Rate:  [63-85] 69 (06/14 0430) Resp:  [9-18] 15 (06/14 0430) BP: (108-159)/(66-84) 140/73 mmHg (06/14 0430) SpO2:  [98 %-100 %] 100 % (06/14 0430) Weight:  [92.987 kg (205 lb)] 92.987 kg (205 lb) (06/13 0939)  Intake/Output from previous day:  Intake/Output Summary (Last 24 hours) at 02/13/15 0748 Last data filed at 02/13/15 0547  Gross per 24 hour  Intake   3340 ml  Output   2202 ml  Net   1138 ml    Intake/Output this shift:    Labs:  Recent Labs  02/13/15 0415  HGB 11.1*    Recent Labs  02/13/15 0415  WBC 11.6*  RBC 3.77*  HCT 34.6*  PLT 394    Recent Labs  02/13/15 0415  NA 138  K 4.6  CL 105  CO2 25  BUN 16  CREATININE 0.63  GLUCOSE 198*  CALCIUM 9.2   No results for input(s): LABPT, INR in the last 72 hours.  EXAM General - Patient is Alert, Appropriate and Oriented Extremity - Neurologically intact Neurovascular intact No cellulitis present Compartment soft Dressing - dressing C/D/I Motor Function - intact, moving foot and toes well on exam.  Hemovac pulled without difficulty.  Past Medical History  Diagnosis Date  . Depression   . Obesity   . Hypothyroidism   . Kidney stone   . IBS (irritable bowel syndrome)   . Degenerative disc disease, cervical   . Kissing, osteophytes     Dr Carloyn Manner  . Allergic rhinitis   . Hyperlipidemia   . Menopause   . Osteoarthritis   . Hyperplastic polyps of stomach   . H/O ulcer disease   . Diabetes mellitus without complication     type two  . Diarrhea 04/2012    colon nl, BX NPD; no response to cholestyramin 03-2012 no response to align; transient resp to sucralfate 12-13  . H/O CT scan of abdomen 08/2012    neg; egd  bile reflux gastritisand linear eros; transient resp to sucralfate   . Hypertension   . Heart murmur     hx of   . Anxiety   . Mild anemia 06/2011    14% iron sat,ferritin 14- from Metformin - anemia resolved   . History of blood transfusion     Assessment/Plan: 1 Day Post-Op Procedure(s) (LRB): LEFT TOTAL KNEE ARTHROPLASTY (Left) Principal Problem:   OA (osteoarthritis) of knee   Advance diet Up with therapy D/C IV fluids Plan for discharge tomorrow  DVT Prophylaxis - Xarelto Weight-Bearing as tolerated to left leg   Moxie Kalil V 02/13/2015, 7:48 AM

## 2015-02-13 NOTE — Evaluation (Signed)
Physical Therapy Evaluation Patient Details Name: Sheri [name] MRN: [id number] DOB: Oct 11, 1950 Today's Date: 02/13/2015   History of Present Illness  s/p L TKA; PMHx:  R TKA  Clinical Impression  Pt admitted with above diagnosis. Pt currently with functional limitations due to the deficits listed below (see PT Problem List).  Pt will benefit from skilled PT to increase their independence and safety with mobility to allow discharge to the venue listed below.       Follow Up Recommendations Home health PT    Equipment Recommendations  None recommended by PT    Recommendations for Other Services       Precautions / Restrictions Precautions Precautions: Knee Required Braces or Orthoses: Knee Immobilizer - Left Knee Immobilizer - Left: Discontinue once straight leg raise with < 10 degree lag Restrictions Other Position/Activity Restrictions: WBAT      Mobility  Bed Mobility Overal bed mobility: Needs Assistance Bed Mobility: Supine to Sit     Supine to sit: Min assist     General bed mobility comments: min with LLE, cues for technique  Transfers Overall transfer level: Needs assistance Equipment used: Rolling walker (2 wheeled) Transfers: Sit to/from Stand Sit to Stand: Min assist         General transfer comment: cues for hand placement  Ambulation/Gait Ambulation/Gait assistance: Min guard;Supervision Ambulation Distance (Feet): 80 Feet   Gait Pattern/deviations: Step-to pattern;Antalgic     General Gait Details: cues for RW position and sequence  Stairs            Wheelchair Mobility    Modified Rankin (Stroke Patients Only)       Balance                                             Pertinent Vitals/Pain Pain Assessment: 0-10 Pain Score: 6  Pain Location: L knee Pain Descriptors / Indicators: Sore Pain Intervention(s): Limited activity within patient's tolerance;Monitored during session;Repositioned;Ice applied     Home Living Family/patient expects to be discharged to:: Private residence Living Arrangements: Other relatives;Spouse/significant other (sister will be there) Available Help at Discharge: Family Type of Home: House     Entrance Stairs-Number of Steps: 1 small step Home Layout: Two level;Able to live on main level with bedroom/bathroom Home Equipment: Gilford Rile - 2 wheels;Tub bench;Bedside commode;Cane - single point      Prior Function Level of Independence: Independent with assistive device(s)         Comments: cane occasionally     Hand Dominance        Extremity/Trunk Assessment   Upper Extremity Assessment: Defer to OT evaluation           Lower Extremity Assessment: LLE deficits/detail   LLE Deficits / Details: hip flexion and knee extension 3/5; ankle WFL     Communication   Communication: No difficulties  Cognition Arousal/Alertness: Awake/alert Behavior During Therapy: WFL for tasks assessed/performed Overall Cognitive Status: Within Functional Limits for tasks assessed                      General Comments      Exercises        Assessment/Plan    PT Assessment Patient needs continued PT services  PT Diagnosis Difficulty walking   PT Problem List Decreased strength;Decreased range of motion;Decreased activity tolerance;Decreased mobility;Decreased knowledge of use of DME  PT Treatment Interventions DME instruction;Gait training;Functional mobility training;Therapeutic activities;Therapeutic exercise;Patient/family education;Stair training   PT Goals (Current goals can be found in the Care Plan section) Acute Rehab PT Goals Patient Stated Goal: regain independence PT Goal Formulation: With patient Time For Goal Achievement: 02/20/15 Potential to Achieve Goals: Good    Frequency 7X/week   Barriers to discharge        Co-evaluation               End of Session Equipment Utilized During Treatment: Gait belt;Left knee  immobilizer Activity Tolerance: Patient tolerated treatment well Patient left: with call bell/phone within reach;in chair;with family/visitor present Nurse Communication: Mobility status         Time: 1014-1050 PT Time Calculation (min) (ACUTE ONLY): 36 min   Charges:   PT Evaluation $Initial PT Evaluation Tier I: 1 Procedure PT Treatments $Gait Training: 8-22 mins   PT G Codes:        Shyleigh Daughtry 2015/03/14, 11:02 AM

## 2015-02-13 NOTE — Progress Notes (Signed)
OT Cancellation Note  Patient Details Name: Sheri [name] MRN: [id number] DOB: April 25, 1951   Cancelled Treatment:    Reason Eval/Treat Not Completed: Other (comment).  Pt does not feel she needs OT.  She has her sister to help, all DME and has had R TKA in the past.  Will sign off.  Hansford Hirt 02/13/2015, 1:57 PM  Lesle Chris, OTR/L [phone] 02/13/2015

## 2015-02-13 NOTE — Progress Notes (Signed)
Date:  February 13, 2015 U.R. performed for needs and level of care. Will continue to follow for Case Management needs.  Velva Harman, RN, BSN, [place]   [phone]

## 2015-02-13 NOTE — Discharge Instructions (Addendum)
° °Dr. Frank Aluisio °Total Joint Specialist °[facility] °[address] °[place], [place] [address] °[phone] ° °TOTAL KNEE REPLACEMENT POSTOPERATIVE DIRECTIONS ° ° ° °Knee Rehabilitation, Guidelines Following Surgery  °Results after knee surgery are often greatly improved when you follow the exercise, range of motion and muscle strengthening exercises prescribed by your doctor. Safety measures are also important to protect the knee from further injury. Any time any of these exercises cause you to have increased pain or swelling in your knee joint, decrease the amount until you are comfortable again and slowly increase them. If you have problems or questions, call your caregiver or physical therapist for advice.  ° °HOME CARE INSTRUCTIONS  °Remove items at home which could result in a fall. This includes throw rugs or furniture in walking pathways.  °Continue medications as instructed at time of discharge. °You may have some home medications which will be placed on hold until you complete the course of blood thinner medication.  °You may start showering once you are discharged home but do not submerge the incision under water. Just pat the incision dry and apply a dry gauze dressing on daily. °Walk with walker as instructed.  °You may resume a sexual relationship in one month or when given the OK by  your doctor.  °· Use walker as long as suggested by your caregivers. °· Avoid periods of inactivity such as sitting longer than an hour when not asleep. This helps prevent blood clots.  °You may return to work once you are cleared by your doctor.  °Do not drive a car for 6 weeks or until released by you surgeon.  °· Do not drive while taking narcotics.  °Wear the elastic stockings for three weeks following surgery during the day but you may remove then at night. °Make sure you keep all of your appointments after your operation with all of your doctors and caregivers. You should call the  office at the above phone number and make an appointment for approximately two weeks after the date of your surgery. °Change the dressing daily and reapply a dry dressing each time. °Please pick up a stool softener and laxative for home use as long as you are requiring pain medications. °· ICE to the affected knee every three hours for 30 minutes at a time and then as needed for pain and swelling.  Continue to use ice on the knee for pain and swelling from surgery. You may notice swelling that will progress down to the foot and ankle.  This is normal after surgery.  Elevate the leg when you are not up walking on it.   °It is important for you to complete the blood thinner medication as prescribed by your doctor. °· Continue to use the breathing machine which will help keep your temperature down.  It is common for your temperature to cycle up and down following surgery, especially at night when you are not up moving around and exerting yourself.  The breathing machine keeps your lungs expanded and your temperature down. ° °RANGE OF MOTION AND STRENGTHENING EXERCISES  °Rehabilitation of the knee is important following a knee injury or an operation. After just a few days of immobilization, the muscles of the thigh which control the knee become weakened and shrink (atrophy). Knee exercises are designed to build up the tone and strength of the thigh muscles and to improve knee motion. Often times heat used for twenty to thirty minutes before working out will loosen up   your tissues and help with improving the range of motion but do not use heat for the first two weeks following surgery. These exercises can be done on a training (exercise) mat, on the floor, on a table or on a bed. Use what ever works the best and is most comfortable for you Knee exercises include:  Leg Lifts - While your knee is still immobilized in a splint or cast, you can do straight leg raises. Lift the leg to 60 degrees, hold for 3 sec, and slowly  lower the leg. Repeat 10-20 times 2-3 times daily. Perform this exercise against resistance later as your knee gets better.  Quad and Hamstring Sets - Tighten up the muscle on the front of the thigh (Quad) and hold for 5-10 sec. Repeat this 10-20 times hourly. Hamstring sets are done by pushing the foot backward against an object and holding for 5-10 sec. Repeat as with quad sets.  A rehabilitation program following serious knee injuries can speed recovery and prevent re-injury in the future due to weakened muscles. Contact your doctor or a physical therapist for more information on knee rehabilitation.   SKILLED REHAB INSTRUCTIONS: If the patient is transferred to a skilled rehab facility following release from the hospital, a list of the current medications will be sent to the facility for the patient to continue.  When discharged from the skilled rehab facility, please have the facility set up the patient's [place] prior to being released. Also, the skilled facility will be responsible for providing the patient with their medications at time of release from the facility to include their pain medication, the muscle relaxants, and their blood thinner medication. If the patient is still at the rehab facility at time of the two week follow up appointment, the skilled rehab facility will also need to assist the patient in arranging follow up appointment in our office and any transportation needs.  MAKE SURE YOU:  Understand these instructions.  Will watch your condition.  Will get help right away if you are not doing well or get worse.    Pick up stool softner and laxative for home use following surgery while on pain medications. Do not submerge incision under water. Please use good hand washing techniques while changing dressing each day. May shower starting three days after surgery. Please use a clean towel to pat the incision dry following showers. Continue to use ice for pain  and swelling after surgery. Do not use any lotions or creams on the incision until instructed by your surgeon.  ____________________________________  Information on my medicine - XARELTO (Rivaroxaban)  This medication education was reviewed with me or my healthcare representative as part of my discharge preparation.    Why was Xarelto prescribed for you? Xarelto was prescribed for you to reduce the risk of blood clots forming after orthopedic surgery. The medical term for these abnormal blood clots is venous thromboembolism (VTE).  What do you need to know about xarelto ? Take your Xarelto ONCE DAILY at the same time every day. You may take it either with or without food.  If you have difficulty swallowing the tablet whole, you may crush it and mix in applesauce just prior to taking your dose.  Take Xarelto exactly as prescribed by your doctor and DO NOT stop taking Xarelto without talking to the doctor who prescribed the medication.  Stopping without other VTE prevention medication to take the place of Xarelto may increase your risk of developing a  clot.  After discharge, you should have regular check-up appointments with your healthcare provider that is prescribing your Xarelto.    What do you do if you miss a dose? If you miss a dose, take it as soon as you remember on the same day then continue your regularly scheduled once daily regimen the next day. Do not take two doses of Xarelto on the same day.   Important Safety Information A possible side effect of Xarelto is bleeding. You should call your healthcare provider right away if you experience any of the following: ? Bleeding from an injury or your nose that does not stop. ? Unusual colored urine (red or dark brown) or unusual colored stools (red or black). ? Unusual bruising for unknown reasons. ? A serious fall or if you hit your head (even if there is no bleeding).  Some medicines may interact with Xarelto and  might increase your risk of bleeding while on Xarelto. To help avoid this, consult your healthcare provider or pharmacist prior to using any new prescription or non-prescription medications, including herbals, vitamins, non-steroidal anti-inflammatory drugs (NSAIDs) and supplements.  This website has more information on Xarelto: [email or web address]

## 2015-02-14 LAB — BASIC METABOLIC PANEL
Anion gap: 9 (ref 5–15)
BUN: 16 mg/dL (ref 6–20)
CO2: 26 mmol/L (ref 22–32)
Calcium: 9.5 mg/dL (ref 8.9–10.3)
Chloride: 103 mmol/L (ref 101–111)
Creatinine, Ser: 0.75 mg/dL (ref 0.44–1.00)
GFR calc Af Amer: >60 mL/min (ref >60)
GFR calc non Af Amer: >60 mL/min (ref >60)
Glucose, Bld: 161 mg/dL — ABNORMAL HIGH (ref 65–99)
Potassium: 4.1 mmol/L (ref 3.5–5.1)
Sodium: 138 mmol/L (ref 135–145)

## 2015-02-14 LAB — CBC
HCT: 32.3 % — ABNORMAL LOW (ref 36.0–46.0)
Hemoglobin: 10.4 g/dL — ABNORMAL LOW (ref 12.0–15.0)
MCH: 29 pg (ref 26.0–34.0)
MCHC: 32.2 g/dL (ref 30.0–36.0)
MCV: 90 fL (ref 78.0–100.0)
Platelets: 403 10*3/uL — ABNORMAL HIGH (ref 150–400)
RBC: 3.59 MIL/uL — ABNORMAL LOW (ref 3.87–5.11)
RDW: 13.8 % (ref 11.5–15.5)
WBC: 16 10*3/uL — ABNORMAL HIGH (ref 4.0–10.5)

## 2015-02-14 LAB — GLUCOSE, CAPILLARY
Glucose-Capillary: 109 mg/dL — ABNORMAL HIGH (ref 65–99)
Glucose-Capillary: 75 mg/dL (ref 65–99)

## 2015-02-14 NOTE — Progress Notes (Signed)
Physical Therapy Treatment Patient Details Name: Sheri [name] MRN: [id number] DOB: Jan 12, 1951 Today's Date: 02/14/2015    History of Present Illness s/p L TKA; PMHx:  R TKA    PT Comments    Pt cooperative and mobilizing well but ltd with ability to perform therex 2* pain to the point of tears.  Follow Up Recommendations  Home health PT     Equipment Recommendations  None recommended by PT    Recommendations for Other Services       Precautions / Restrictions Precautions Precautions: Knee Precaution Comments: pt prefers to use KI, states it helps pain control Required Braces or Orthoses: Knee Immobilizer - Left Knee Immobilizer - Left: Discontinue once straight leg raise with < 10 degree lag Restrictions Other Position/Activity Restrictions: WBAT    Mobility  Bed Mobility Overal bed mobility: Needs Assistance Bed Mobility: Supine to Sit     Supine to sit: Min guard     General bed mobility comments: cues for sequence and use of R LE to self assist  Transfers Overall transfer level: Needs assistance Equipment used: Rolling walker (2 wheeled) Transfers: Sit to/from Stand Sit to Stand: Min guard         General transfer comment: cues for hand placement  Ambulation/Gait Ambulation/Gait assistance: Min guard Ambulation Distance (Feet): 123 Feet Assistive device: Rolling walker (2 wheeled) Gait Pattern/deviations: Step-to pattern;Step-through pattern;Decreased step length - right;Decreased step length - left;Shuffle;Trunk flexed     General Gait Details: cues for RW position and sequence   Stairs Stairs: Yes Stairs assistance: Min assist Stair Management: No rails;Step to pattern;Forwards;Backwards;With walker Number of Stairs: 2 General stair comments: single step fwd and single step bkwd with cues for sequence and foot/RW placement  Wheelchair Mobility    Modified Rankin (Stroke Patients Only)       Balance                                    Cognition Arousal/Alertness: Awake/alert Behavior During Therapy: WFL for tasks assessed/performed Overall Cognitive Status: Within Functional Limits for tasks assessed                      Exercises Total Joint Exercises Ankle Circles/Pumps: AROM;15 reps;Both;Supine Quad Sets: Both;AROM;10 reps Straight Leg Raises: AAROM;5 reps;Left;Supine    General Comments        Pertinent Vitals/Pain Pain Assessment: 0-10 Pain Score: 9  Pain Location: L knee Pain Descriptors / Indicators: Aching;Sore;Grimacing;Crying Pain Intervention(s): Limited activity within patient's tolerance;Monitored during session;Premedicated before session;Ice applied    Home Living                      Prior Function            PT Goals (current goals can now be found in the care plan section) Acute Rehab PT Goals Patient Stated Goal: regain independence PT Goal Formulation: With patient Time For Goal Achievement: 02/20/15 Potential to Achieve Goals: Good Progress towards PT goals: Progressing toward goals    Frequency  7X/week    PT Plan Current plan remains appropriate    Co-evaluation             End of Session Equipment Utilized During Treatment: Left knee immobilizer Activity Tolerance: Patient tolerated treatment well Patient left: with call bell/phone within reach;with family/visitor present;in chair     Time: 1016-1050 PT Time Calculation (min) (ACUTE  ONLY): 34 min  Charges:  $Gait Training: 8-22 mins $Therapeutic Exercise: 8-22 mins                    G Codes:      Sheri [name] 2015-03-02, 1:34 PM

## 2015-02-14 NOTE — Progress Notes (Signed)
   Subjective: 2 Days Post-Op Procedure(s) (LRB): LEFT TOTAL KNEE ARTHROPLASTY (Left) Patient reports pain as moderate.   Patient seen in rounds with [name]. Patient is having issues with pain control. No SOB or chest pain. Voiding well.  Plan is to go Home after hospital stay.  Objective: Vital signs in last 24 hours: Temp:  [97.6 F (36.4 C)-99.3 F (37.4 C)] 99.3 F (37.4 C) (06/15 0505) Pulse Rate:  [70-78] 70 (06/15 0505) Resp:  [16-18] 16 (06/15 0505) BP: (136-157)/(74-77) 143/77 mmHg (06/15 0505) SpO2:  [99 %-100 %] 100 % (06/15 0505)  Intake/Output from previous day:  Intake/Output Summary (Last 24 hours) at 02/14/15 0800 Last data filed at 02/13/15 2300  Gross per 24 hour  Intake 1508.75 ml  Output   1900 ml  Net -391.25 ml    Intake/Output this shift:    Labs:  Recent Labs  02/13/15 0415 02/14/15 0450  HGB 11.1* 10.4*    Recent Labs  02/13/15 0415 02/14/15 0450  WBC 11.6* 16.0*  RBC 3.77* 3.59*  HCT 34.6* 32.3*  PLT 394 403*    Recent Labs  02/13/15 0415 02/14/15 0450  NA 138 138  K 4.6 4.1  CL 105 103  CO2 25 26  BUN 16 16  CREATININE 0.63 0.75  GLUCOSE 198* 161*  CALCIUM 9.2 9.5   EXAM General - Patient is Alert and Oriented Extremity - Neurologically intact Sensation intact distally Intact pulses distally Dorsiflexion/Plantar flexion intact Compartment soft Dressing/Incision - clean, dry, no drainage Motor Function - intact, moving foot and toes well on exam.   Past Medical History  Diagnosis Date  . Depression   . Obesity   . Hypothyroidism   . Kidney stone   . IBS (irritable bowel syndrome)   . Degenerative disc disease, cervical   . Kissing, osteophytes     Dr Carloyn Manner  . Allergic rhinitis   . Hyperlipidemia   . Menopause   . Osteoarthritis   . Hyperplastic polyps of stomach   . H/O ulcer disease   . Diabetes mellitus without complication     type two  . Diarrhea 04/2012    colon nl, BX NPD; no response to  cholestyramin 03-2012 no response to align; transient resp to sucralfate 12-13  . H/O CT scan of abdomen 08/2012    neg; egd bile reflux gastritisand linear eros; transient resp to sucralfate   . Hypertension   . Heart murmur     hx of   . Anxiety   . Mild anemia 06/2011    14% iron sat,ferritin 14- from Metformin - anemia resolved   . History of blood transfusion     Assessment/Plan: 2 Days Post-Op Procedure(s) (LRB): LEFT TOTAL KNEE ARTHROPLASTY (Left) Principal Problem:   OA (osteoarthritis) of knee  Estimated body mass index is 34.11 kg/(m^2) as calculated from the following:   Height as of this encounter: $RemoveBeforeD'5\' 5"'[id number]$  (1.651 m).   Weight as of this encounter: 92.987 kg (205 lb). Advance diet Up with therapy Discharge home with home health  DVT Prophylaxis - Xarelto Weight-Bearing as tolerated   Will discuss pain management options with [name] prior to patient DC. Will continue PT today.   Ardeen Jourdain, PA-C Orthopaedic Surgery 02/14/2015, 8:00 AM

## 2015-02-15 DIAGNOSIS — R269 Unspecified abnormalities of gait and mobility: Secondary | ICD-10-CM | POA: Diagnosis not present

## 2015-02-15 DIAGNOSIS — Z96653 Presence of artificial knee joint, bilateral: Secondary | ICD-10-CM | POA: Diagnosis not present

## 2015-02-15 DIAGNOSIS — I10 Essential (primary) hypertension: Secondary | ICD-10-CM | POA: Diagnosis not present

## 2015-02-15 DIAGNOSIS — Z471 Aftercare following joint replacement surgery: Secondary | ICD-10-CM | POA: Diagnosis not present

## 2015-02-15 NOTE — Discharge Summary (Signed)
Physician Discharge Summary   [name] MRN: [id number] DOB/AGE: 03-Mar-1951 64 y.o.  Admit date: 02/12/2015 Discharge date: 02/14/2015  Primary Diagnosis: Primary osteoarthritis, left knee  Admission Diagnoses:  Past Medical History  Diagnosis Date  . Depression   . Obesity   . Hypothyroidism   . Kidney stone   . IBS (irritable bowel syndrome)   . Degenerative disc disease, cervical   . Kissing, osteophytes     Dr Carloyn Manner  . Allergic rhinitis   . Hyperlipidemia   . Menopause   . Osteoarthritis   . Hyperplastic polyps of stomach   . H/O ulcer disease   . Diabetes mellitus without complication     type two  . Diarrhea 04/2012    colon nl, BX NPD; no response to cholestyramin 03-2012 no response to align; transient resp to sucralfate 12-13  . H/O CT scan of abdomen 08/2012    neg; egd bile reflux gastritisand linear eros; transient resp to sucralfate   . Hypertension   . Heart murmur     hx of   . Anxiety   . Mild anemia 06/2011    14% iron sat,ferritin 14- from Metformin - anemia resolved   . History of blood transfusion    Discharge Diagnoses:   Principal Problem:   OA (osteoarthritis) of knee  Estimated body mass index is 34.11 kg/(m^2) as calculated from the following:   Height as of this encounter: '5\' 5"'  (1.651 m).   Weight as of this encounter: 92.987 kg (205 lb).  Procedure:  Procedure(s) (LRB): LEFT TOTAL KNEE ARTHROPLASTY (Left)   Consults: None  HPI: The patient is a 64 year old female being followed for their left knee pain and osteoarthritis. They are now post cortisone injection. Symptoms reported include: pain, swelling, grinding and difficulty ambulating. The patient feels that they are doing poorly and report their pain level to be mild to moderate. The following medication has been used for pain control: Tylenol and tramadol. The patient has reported improvement of their symptoms with: Cortisone injections (but it did not last very  long). Unfortunately, her left knee is getting progressively worse. It is limiting what she can and cannot do. She's at a stage where she feels like the cortisone is not helping as much as it used to do. Visco supplements have not worked for her in the past. She has had her right knee replaced. She's had some soreness in it recently but feels like she may be overloading that knee. She is also having pain in the mid thigh area and in the buttock. She said that the pain from the buttock and mid thigh radiate to her knee. She has not had any swelling in the right knee. She has had progressively worsening pain and dysfunction and wants to go ahead and get the knee replaced. She has had the other knee replaced but I told her the differences we utilize as compared to her surgeon out near [place]. I told her about the [facility] and the rehab. I would like for her to stay in town for two weeks post operative for home health physical therapy and she agrees to that. After that[place]. I discussed everything in detail in regards to the knee replacement. She will proceed as planned. They have been treated conservatively in the past for the above stated problem and despite conservative measures, they continue to have progressive pain and  severe functional limitations and dysfunction. They have failed non-operative management including home exercise, medications, and injections. It is felt that they would benefit from undergoing total joint replacement. Risks and benefits of the procedure have been discussed with the patient and they elect to proceed with surgery. There are no active contraindications to surgery such as ongoing infection or rapidly progressive neurological disease.  Laboratory Data: Admission on 02/12/2015, Discharged on 02/14/2015  Component Date Value Ref Range Status  . Glucose-Capillary 02/12/2015 126* 65 - 99 mg/dL Final    . Comment 1 02/12/2015 Notify RN   Final  . Glucose-Capillary 02/12/2015 85  65 - 99 mg/dL Final  . Comment 1 02/12/2015 Notify RN   Final  . Comment 2 02/12/2015 Document in Chart   Final  . WBC 02/13/2015 11.6* 4.0 - 10.5 K/uL Final  . RBC 02/13/2015 3.77* 3.87 - 5.11 MIL/uL Final  . Hemoglobin 02/13/2015 11.1* 12.0 - 15.0 g/dL Final  . HCT 02/13/2015 34.6* 36.0 - 46.0 % Final  . MCV 02/13/2015 91.8  78.0 - 100.0 fL Final  . MCH 02/13/2015 29.4  26.0 - 34.0 pg Final  . MCHC 02/13/2015 32.1  30.0 - 36.0 g/dL Final  . RDW 02/13/2015 13.9  11.5 - 15.5 % Final  . Platelets 02/13/2015 394  150 - 400 K/uL Final  . Sodium 02/13/2015 138  135 - 145 mmol/L Final  . Potassium 02/13/2015 4.6  3.5 - 5.1 mmol/L Final  . Chloride 02/13/2015 105  101 - 111 mmol/L Final  . CO2 02/13/2015 25  22 - 32 mmol/L Final  . Glucose, Bld 02/13/2015 198* 65 - 99 mg/dL Final  . BUN 02/13/2015 16  6 - 20 mg/dL Final  . Creatinine, Ser 02/13/2015 0.63  0.44 - 1.00 mg/dL Final  . Calcium 02/13/2015 9.2  8.9 - 10.3 mg/dL Final  . GFR calc non Af Amer 02/13/2015 >60  >60 mL/min Final  . GFR calc Af Amer 02/13/2015 >60  >60 mL/min Final   Comment: (NOTE) The eGFR has been calculated using the CKD EPI equation. This calculation has not been validated in all clinical situations. eGFR's persistently <60 mL/min signify possible Chronic Kidney Disease.   . Anion gap 02/13/2015 8  5 - 15 Final  . Glucose-Capillary 02/12/2015 154* 65 - 99 mg/dL Final  . Glucose-Capillary 02/12/2015 185* 65 - 99 mg/dL Final  . Glucose-Capillary 02/13/2015 137* 65 - 99 mg/dL Final  . Glucose-Capillary 02/13/2015 139* 65 - 99 mg/dL Final  . WBC 02/14/2015 16.0* 4.0 - 10.5 K/uL Final  . RBC 02/14/2015 3.59* 3.87 - 5.11 MIL/uL Final  . Hemoglobin 02/14/2015 10.4* 12.0 - 15.0 g/dL Final  . HCT 02/14/2015 32.3* 36.0 - 46.0 % Final  . MCV 02/14/2015 90.0  78.0 - 100.0 fL Final  . MCH 02/14/2015 29.0  26.0 - 34.0 pg Final  . MCHC  02/14/2015 32.2  30.0 - 36.0 g/dL Final  . RDW 02/14/2015 13.8  11.5 - 15.5 % Final  . Platelets 02/14/2015 403* 150 - 400 K/uL Final  . Sodium 02/14/2015 138  135 - 145 mmol/L Final  . Potassium 02/14/2015 4.1  3.5 - 5.1 mmol/L Final  . Chloride 02/14/2015 103  101 - 111 mmol/L Final  . CO2 02/14/2015 26  22 - 32 mmol/L Final  . Glucose, Bld 02/14/2015 161* 65 - 99 mg/dL Final  . BUN 02/14/2015 16  6 - 20 mg/dL Final  . Creatinine, Ser 02/14/2015 0.75  0.44 - 1.00 mg/dL Final  .  Calcium 02/14/2015 9.5  8.9 - 10.3 mg/dL Final  . GFR calc non Af Amer 02/14/2015 >60  >60 mL/min Final  . GFR calc Af Amer 02/14/2015 >60  >60 mL/min Final   Comment: (NOTE) The eGFR has been calculated using the CKD EPI equation. This calculation has not been validated in all clinical situations. eGFR's persistently <60 mL/min signify possible Chronic Kidney Disease.   . Anion gap 02/14/2015 9  5 - 15 Final  . Glucose-Capillary 02/13/2015 235* 65 - 99 mg/dL Final  . Glucose-Capillary 02/13/2015 156* 65 - 99 mg/dL Final  . Glucose-Capillary 02/14/2015 109* 65 - 99 mg/dL Final  . Glucose-Capillary 02/14/2015 75  65 - 99 mg/dL Final  Hospital Outpatient Visit on 02/05/2015  Component Date Value Ref Range Status  . ABO/RH(D) 02/05/2015 A POS   Final  . Antibody Screen 02/05/2015 NEG   Final  . Sample Expiration 02/05/2015 02/15/2015   Final  . aPTT 02/05/2015 33  24 - 37 seconds Final  . WBC 02/05/2015 7.4  4.0 - 10.5 K/uL Final  . RBC 02/05/2015 4.19  3.87 - 5.11 MIL/uL Final  . Hemoglobin 02/05/2015 12.1  12.0 - 15.0 g/dL Final  . HCT 02/05/2015 39.0  36.0 - 46.0 % Final  . MCV 02/05/2015 93.1  78.0 - 100.0 fL Final  . MCH 02/05/2015 28.9  26.0 - 34.0 pg Final  . MCHC 02/05/2015 31.0  30.0 - 36.0 g/dL Final  . RDW 02/05/2015 13.9  11.5 - 15.5 % Final  . Platelets 02/05/2015 376  150 - 400 K/uL Final  . Sodium 02/05/2015 138  135 - 145 mmol/L Final  . Potassium 02/05/2015 4.3  3.5 - 5.1 mmol/L Final    . Chloride 02/05/2015 103  101 - 111 mmol/L Final  . CO2 02/05/2015 27  22 - 32 mmol/L Final  . Glucose, Bld 02/05/2015 86  65 - 99 mg/dL Final  . BUN 02/05/2015 19  6 - 20 mg/dL Final  . Creatinine, Ser 02/05/2015 0.61  0.44 - 1.00 mg/dL Final  . Calcium 02/05/2015 9.7  8.9 - 10.3 mg/dL Final  . Total Protein 02/05/2015 7.2  6.5 - 8.1 g/dL Final  . Albumin 02/05/2015 4.2  3.5 - 5.0 g/dL Final  . AST 02/05/2015 22  15 - 41 U/L Final  . ALT 02/05/2015 21  14 - 54 U/L Final  . Alkaline Phosphatase 02/05/2015 76  38 - 126 U/L Final  . Total Bilirubin 02/05/2015 0.2* 0.3 - 1.2 mg/dL Final  . GFR calc non Af Amer 02/05/2015 >60  >60 mL/min Final  . GFR calc Af Amer 02/05/2015 >60  >60 mL/min Final   Comment: (NOTE) The eGFR has been calculated using the CKD EPI equation. This calculation has not been validated in all clinical situations. eGFR's persistently <60 mL/min signify possible Chronic Kidney Disease.   . Anion gap 02/05/2015 8  5 - 15 Final  . Prothrombin Time 02/05/2015 14.2  11.6 - 15.2 seconds Final  . INR 02/05/2015 1.08  0.00 - 1.49 Final  . Color, Urine 02/05/2015 YELLOW  YELLOW Final  . APPearance 02/05/2015 CLEAR  CLEAR Final  . Specific Gravity, Urine 02/05/2015 1.029  1.005 - 1.030 Final  . pH 02/05/2015 5.0  5.0 - 8.0 Final  . Glucose, UA 02/05/2015 NEGATIVE  NEGATIVE mg/dL Final  . Hgb urine dipstick 02/05/2015 NEGATIVE  NEGATIVE Final  . Bilirubin Urine 02/05/2015 NEGATIVE  NEGATIVE Final  . Ketones, ur 02/05/2015 NEGATIVE  NEGATIVE mg/dL Final  .  Protein, ur 02/05/2015 NEGATIVE  NEGATIVE mg/dL Final  . Urobilinogen, UA 02/05/2015 0.2  0.0 - 1.0 mg/dL Final  . Nitrite 02/05/2015 NEGATIVE  NEGATIVE Final  . Leukocytes, UA 02/05/2015 SMALL* NEGATIVE Final  . MRSA, PCR 02/05/2015 INVALID RESULTS, SPECIMEN SENT FOR CULTURE* NEGATIVE Final   NOTIFIED Franz Dell RN AT 1610 ON 06.06.16 BY SHUEA  . Staphylococcus aureus 02/05/2015 INVALID RESULTS, SPECIMEN SENT FOR  CULTURE* NEGATIVE Final   Comment:        The Xpert SA Assay (FDA approved for NASAL specimens in patients over 76 years of age), is one component of a comprehensive surveillance program.  Test performance has been validated by [facility] for patients greater than or equal to 65 year old. It is not intended to diagnose infection nor to guide or monitor treatment.   . Squamous Epithelial / LPF 02/05/2015 RARE  RARE Final  . WBC, UA 02/05/2015 0-2  <3 WBC/hpf Final  . RBC / HPF 02/05/2015 0-2  <3 RBC/hpf Final  . Urine-Other 02/05/2015 MUCOUS PRESENT   Final  . ABO/RH(D) 02/05/2015 A POS   Final  . Specimen Description 02/05/2015 NOSE   Final  . Special Requests 02/05/2015 NONE   Final  . Culture 02/05/2015    Final                   Value:NO STAPHYLOCOCCUS AUREUS ISOLATED Note: NOMRSA Performed at [facility]   . Report Status 02/05/2015 02/07/2015 FINAL   Final  Hospital Outpatient Visit on 01/04/2015  Component Date Value Ref Range Status  . aPTT 01/04/2015 34  24 - 37 seconds Final  . WBC 01/04/2015 8.6  4.0 - 10.5 K/uL Final  . RBC 01/04/2015 4.27  3.87 - 5.11 MIL/uL Final  . Hemoglobin 01/04/2015 12.7  12.0 - 15.0 g/dL Final  . HCT 01/04/2015 39.4  36.0 - 46.0 % Final  . MCV 01/04/2015 92.3  78.0 - 100.0 fL Final  . MCH 01/04/2015 29.7  26.0 - 34.0 pg Final  . MCHC 01/04/2015 32.2  30.0 - 36.0 g/dL Final  . RDW 01/04/2015 14.2  11.5 - 15.5 % Final  . Platelets 01/04/2015 386  150 - 400 K/uL Final  . Sodium 01/04/2015 138  135 - 145 mmol/L Final  . Potassium 01/04/2015 4.4  3.5 - 5.1 mmol/L Final  . Chloride 01/04/2015 105  101 - 111 mmol/L Final  . CO2 01/04/2015 27  22 - 32 mmol/L Final  . Glucose, Bld 01/04/2015 103* 70 - 99 mg/dL Final  . BUN 01/04/2015 22* 6 - 20 mg/dL Final  . Creatinine, Ser 01/04/2015 0.74  0.44 - 1.00 mg/dL Final  . Calcium 01/04/2015 9.8  8.9 - 10.3 mg/dL Final  . Total Protein 01/04/2015 7.3  6.5 - 8.1 g/dL Final  . Albumin  01/04/2015 4.5  3.5 - 5.0 g/dL Final  . AST 01/04/2015 28  15 - 41 U/L Final  . ALT 01/04/2015 26  14 - 54 U/L Final  . Alkaline Phosphatase 01/04/2015 71  38 - 126 U/L Final  . Total Bilirubin 01/04/2015 0.7  0.3 - 1.2 mg/dL Final  . GFR calc non Af Amer 01/04/2015 >60  >60 mL/min Final  . GFR calc Af Amer 01/04/2015 >60  >60 mL/min Final   Comment: (NOTE) The eGFR has been calculated using the CKD EPI equation. This calculation has not been validated in all clinical situations. eGFR's persistently <90 mL/min signify possible Chronic Kidney Disease.   Marland Kitchen  Anion gap 01/04/2015 6  5 - 15 Final  . Prothrombin Time 01/04/2015 12.8  11.6 - 15.2 seconds Final  . INR 01/04/2015 0.95  0.00 - 1.49 Final  . Color, Urine 01/04/2015 YELLOW  YELLOW Final  . APPearance 01/04/2015 CLEAR  CLEAR Final  . Specific Gravity, Urine 01/04/2015 1.031* 1.005 - 1.030 Final  . pH 01/04/2015 5.0  5.0 - 8.0 Final  . Glucose, UA 01/04/2015 NEGATIVE  NEGATIVE mg/dL Final  . Hgb urine dipstick 01/04/2015 NEGATIVE  NEGATIVE Final  . Bilirubin Urine 01/04/2015 NEGATIVE  NEGATIVE Final  . Ketones, ur 01/04/2015 NEGATIVE  NEGATIVE mg/dL Final  . Protein, ur 01/04/2015 NEGATIVE  NEGATIVE mg/dL Final  . Urobilinogen, UA 01/04/2015 0.2  0.0 - 1.0 mg/dL Final  . Nitrite 01/04/2015 NEGATIVE  NEGATIVE Final  . Leukocytes, UA 01/04/2015 TRACE* NEGATIVE Final  . MRSA, PCR 01/04/2015 NEGATIVE  NEGATIVE Final  . Staphylococcus aureus 01/04/2015 NEGATIVE  NEGATIVE Final   Comment:        The Xpert SA Assay (FDA approved for NASAL specimens in patients over 22 years of age), is one component of a comprehensive surveillance program.  Test performance has been validated by [facility] for patients greater than or equal to 30 year old. It is not intended to diagnose infection nor to guide or monitor treatment.   . WBC, UA 01/04/2015 0-2  <3 WBC/hpf Final  . Bacteria, UA 01/04/2015 FEW* RARE Final  . Urine-Other 01/04/2015  MUCOUS PRESENT   Final     X-Rays:No results found.  EKG: Orders placed or performed in visit on 12/06/13  . EKG     Hospital Course: Sheri [name] is a 64 y.o. who was admitted to [facility]. They were brought to the operating room on 02/12/2015 and underwent Procedure(s): LEFT TOTAL KNEE ARTHROPLASTY.  Patient tolerated the procedure well and was later transferred to the recovery room and then to the orthopaedic floor for postoperative care.  They were given PO and IV analgesics for pain control following their surgery.  They were given 24 hours of postoperative antibiotics of  Anti-infectives    Start     Dose/Rate Route Frequency Ordered Stop   02/12/15 1800  ceFAZolin (ANCEF) IVPB 2 g/50 mL premix     2 g 100 mL/hr over 30 Minutes Intravenous Every 6 hours 02/12/15 1513 02/13/15 0023   02/12/15 0938  ceFAZolin (ANCEF) IVPB 2 g/50 mL premix     2 g 100 mL/hr over 30 Minutes Intravenous On call to O.R. 02/12/15 [id number] 02/12/15 1223     and started on DVT prophylaxis in the form of Xarelto.   PT and OT were ordered for total joint protocol.  Discharge planning consulted to help with postop disposition and equipment needs.  Patient had a decent night on the evening of surgery but did have some issues with pain management.  They started to get up OOB with therapy on day one. Hemovac drain was pulled without difficulty.  Continued to work with therapy into day two.  Dressing was changed on day two and the incision was clean and dry. Patient was seen in rounds and was ready to go home.   Diet: Diabetic diet Activity:WBAT Follow-up:in 2 weeks Disposition - Home Discharged Condition: stable   Discharge Instructions    Call MD / Call 911    Complete by:  As directed   If you experience chest pain or shortness of breath, CALL 911 and be transported to  the hospital emergency room.  If you develope a fever above 101 F, pus (white drainage) or increased drainage or redness at the  wound, or calf pain, call your surgeon's office.     Constipation Prevention    Complete by:  As directed   Drink plenty of fluids.  Prune juice may be helpful.  You may use a stool softener, such as Colace (over the counter) 100 mg twice a day.  Use MiraLax (over the counter) for constipation as needed.     Diet Carb Modified    Complete by:  As directed      Increase activity slowly as tolerated    Complete by:  As directed             Medication List    STOP taking these medications        aspirin EC 81 MG tablet     meloxicam 15 MG tablet  Commonly known as:  MOBIC     OVER THE COUNTER MEDICATION     Pitavastatin Calcium 2 MG Tabs  Commonly known as:  LIVALO     TURMERIC PO     Vitamin D 2000 UNITS Caps      TAKE these medications        acetaminophen 650 MG CR tablet  Commonly known as:  TYLENOL  Take 1,300 mg by mouth every 8 (eight) hours as needed for pain.     amLODipine-benazepril 5-20 MG per capsule  Commonly known as:  LOTREL  Take 1 capsule by mouth every morning.     HYDROmorphone 2 MG tablet  Commonly known as:  DILAUDID  Take 1-2 tablets (2-4 mg total) by mouth every 3 (three) hours as needed for severe pain.     levothyroxine 125 MCG tablet  Commonly known as:  SYNTHROID, LEVOTHROID  Take 125 mcg by mouth daily.     lidocaine 5 %  Commonly known as:  LIDODERM  Place 1 patch onto the skin daily.     methocarbamol 500 MG tablet  Commonly known as:  ROBAXIN  Take 1 tablet (500 mg total) by mouth every 6 (six) hours as needed for muscle spasms.     pantoprazole 40 MG tablet  Commonly known as:  PROTONIX  Take 40 mg by mouth daily.     rivaroxaban 10 MG Tabs tablet  Commonly known as:  XARELTO  Take 1 tablet (10 mg total) by mouth daily with breakfast.     sitaGLIPtin 100 MG tablet  Commonly known as:  JANUVIA  Take 100 mg by mouth daily.     traMADol 50 MG tablet  Commonly known as:  ULTRAM  Take 1-2 tablets (50-100 mg total) by  mouth every 6 (six) hours as needed for moderate pain.           Follow-up Information    Follow up with Gearlean Alf, MD. Schedule an appointment as soon as possible for a visit on 02/27/2015.   Specialty:  Orthopedic Surgery   Why:  Call [phone] tomorrow to make the appointment   Contact information:   [address] [place] [address] [phone]       Follow up with [facility]   Why:  home health physical therapy   Contact information:   [address] [place] [address] [phone]       Signed: Ardeen Jourdain, PA-C Orthopaedic Surgery 02/15/2015, 12:40 PM

## 2015-02-16 DIAGNOSIS — R269 Unspecified abnormalities of gait and mobility: Secondary | ICD-10-CM | POA: Diagnosis not present

## 2015-02-16 DIAGNOSIS — Z471 Aftercare following joint replacement surgery: Secondary | ICD-10-CM | POA: Diagnosis not present

## 2015-02-16 DIAGNOSIS — Z96653 Presence of artificial knee joint, bilateral: Secondary | ICD-10-CM | POA: Diagnosis not present

## 2015-02-16 DIAGNOSIS — I10 Essential (primary) hypertension: Secondary | ICD-10-CM | POA: Diagnosis not present

## 2015-02-19 DIAGNOSIS — I10 Essential (primary) hypertension: Secondary | ICD-10-CM | POA: Diagnosis not present

## 2015-02-19 DIAGNOSIS — Z471 Aftercare following joint replacement surgery: Secondary | ICD-10-CM | POA: Diagnosis not present

## 2015-02-19 DIAGNOSIS — Z96653 Presence of artificial knee joint, bilateral: Secondary | ICD-10-CM | POA: Diagnosis not present

## 2015-02-19 DIAGNOSIS — R269 Unspecified abnormalities of gait and mobility: Secondary | ICD-10-CM | POA: Diagnosis not present

## 2015-02-21 DIAGNOSIS — Z96653 Presence of artificial knee joint, bilateral: Secondary | ICD-10-CM | POA: Diagnosis not present

## 2015-02-21 DIAGNOSIS — Z471 Aftercare following joint replacement surgery: Secondary | ICD-10-CM | POA: Diagnosis not present

## 2015-02-21 DIAGNOSIS — I10 Essential (primary) hypertension: Secondary | ICD-10-CM | POA: Diagnosis not present

## 2015-02-21 DIAGNOSIS — R269 Unspecified abnormalities of gait and mobility: Secondary | ICD-10-CM | POA: Diagnosis not present

## 2015-02-23 DIAGNOSIS — Z96653 Presence of artificial knee joint, bilateral: Secondary | ICD-10-CM | POA: Diagnosis not present

## 2015-02-23 DIAGNOSIS — R269 Unspecified abnormalities of gait and mobility: Secondary | ICD-10-CM | POA: Diagnosis not present

## 2015-02-23 DIAGNOSIS — I10 Essential (primary) hypertension: Secondary | ICD-10-CM | POA: Diagnosis not present

## 2015-02-23 DIAGNOSIS — Z471 Aftercare following joint replacement surgery: Secondary | ICD-10-CM | POA: Diagnosis not present

## 2015-02-26 ENCOUNTER — Other Ambulatory Visit: Payer: Self-pay

## 2015-02-27 DIAGNOSIS — Z471 Aftercare following joint replacement surgery: Secondary | ICD-10-CM | POA: Diagnosis not present

## 2015-02-27 DIAGNOSIS — Z96652 Presence of left artificial knee joint: Secondary | ICD-10-CM | POA: Diagnosis not present

## 2015-03-02 DIAGNOSIS — R262 Difficulty in walking, not elsewhere classified: Secondary | ICD-10-CM | POA: Diagnosis not present

## 2015-03-02 DIAGNOSIS — M25562 Pain in left knee: Secondary | ICD-10-CM | POA: Diagnosis not present

## 2015-03-07 DIAGNOSIS — R262 Difficulty in walking, not elsewhere classified: Secondary | ICD-10-CM | POA: Diagnosis not present

## 2015-03-07 DIAGNOSIS — M25562 Pain in left knee: Secondary | ICD-10-CM | POA: Diagnosis not present

## 2015-03-08 DIAGNOSIS — R262 Difficulty in walking, not elsewhere classified: Secondary | ICD-10-CM | POA: Diagnosis not present

## 2015-03-08 DIAGNOSIS — M25562 Pain in left knee: Secondary | ICD-10-CM | POA: Diagnosis not present

## 2015-03-14 DIAGNOSIS — R262 Difficulty in walking, not elsewhere classified: Secondary | ICD-10-CM | POA: Diagnosis not present

## 2015-03-14 DIAGNOSIS — M25562 Pain in left knee: Secondary | ICD-10-CM | POA: Diagnosis not present

## 2015-03-15 DIAGNOSIS — R262 Difficulty in walking, not elsewhere classified: Secondary | ICD-10-CM | POA: Diagnosis not present

## 2015-03-15 DIAGNOSIS — M25562 Pain in left knee: Secondary | ICD-10-CM | POA: Diagnosis not present

## 2015-03-19 DIAGNOSIS — Z96652 Presence of left artificial knee joint: Secondary | ICD-10-CM | POA: Diagnosis not present

## 2015-03-19 DIAGNOSIS — Z471 Aftercare following joint replacement surgery: Secondary | ICD-10-CM | POA: Diagnosis not present

## 2015-03-23 DIAGNOSIS — H52221 Regular astigmatism, right eye: Secondary | ICD-10-CM | POA: Diagnosis not present

## 2015-03-23 DIAGNOSIS — H33103 Unspecified retinoschisis, bilateral: Secondary | ICD-10-CM | POA: Diagnosis not present

## 2015-03-23 DIAGNOSIS — H33303 Unspecified retinal break, bilateral: Secondary | ICD-10-CM | POA: Diagnosis not present

## 2015-03-23 DIAGNOSIS — H5213 Myopia, bilateral: Secondary | ICD-10-CM | POA: Diagnosis not present

## 2015-03-23 DIAGNOSIS — H25813 Combined forms of age-related cataract, bilateral: Secondary | ICD-10-CM | POA: Diagnosis not present

## 2015-03-23 DIAGNOSIS — H4423 Degenerative myopia, bilateral: Secondary | ICD-10-CM | POA: Diagnosis not present

## 2015-03-27 DIAGNOSIS — M25562 Pain in left knee: Secondary | ICD-10-CM | POA: Diagnosis not present

## 2015-03-27 DIAGNOSIS — R262 Difficulty in walking, not elsewhere classified: Secondary | ICD-10-CM | POA: Diagnosis not present

## 2015-03-29 DIAGNOSIS — M25562 Pain in left knee: Secondary | ICD-10-CM | POA: Diagnosis not present

## 2015-03-29 DIAGNOSIS — R262 Difficulty in walking, not elsewhere classified: Secondary | ICD-10-CM | POA: Diagnosis not present

## 2015-04-10 DIAGNOSIS — R262 Difficulty in walking, not elsewhere classified: Secondary | ICD-10-CM | POA: Diagnosis not present

## 2015-04-10 DIAGNOSIS — M25562 Pain in left knee: Secondary | ICD-10-CM | POA: Diagnosis not present

## 2015-04-17 DIAGNOSIS — Z96652 Presence of left artificial knee joint: Secondary | ICD-10-CM | POA: Diagnosis not present

## 2015-04-17 DIAGNOSIS — Z471 Aftercare following joint replacement surgery: Secondary | ICD-10-CM | POA: Diagnosis not present

## 2015-05-28 DIAGNOSIS — M4302 Spondylolysis, cervical region: Secondary | ICD-10-CM | POA: Diagnosis not present

## 2015-05-28 DIAGNOSIS — Z23 Encounter for immunization: Secondary | ICD-10-CM | POA: Diagnosis not present

## 2015-05-28 DIAGNOSIS — M47816 Spondylosis without myelopathy or radiculopathy, lumbar region: Secondary | ICD-10-CM | POA: Diagnosis not present

## 2015-07-17 DIAGNOSIS — Z471 Aftercare following joint replacement surgery: Secondary | ICD-10-CM | POA: Diagnosis not present

## 2015-07-17 DIAGNOSIS — Z96652 Presence of left artificial knee joint: Secondary | ICD-10-CM | POA: Diagnosis not present

## 2015-09-27 DIAGNOSIS — M4302 Spondylolysis, cervical region: Secondary | ICD-10-CM | POA: Diagnosis not present

## 2015-09-27 DIAGNOSIS — Z23 Encounter for immunization: Secondary | ICD-10-CM | POA: Diagnosis not present

## 2015-09-27 DIAGNOSIS — E785 Hyperlipidemia, unspecified: Secondary | ICD-10-CM | POA: Diagnosis not present

## 2015-09-27 DIAGNOSIS — M5481 Occipital neuralgia: Secondary | ICD-10-CM | POA: Diagnosis not present

## 2015-09-27 DIAGNOSIS — I10 Essential (primary) hypertension: Secondary | ICD-10-CM | POA: Diagnosis not present

## 2015-09-27 DIAGNOSIS — K219 Gastro-esophageal reflux disease without esophagitis: Secondary | ICD-10-CM | POA: Diagnosis not present

## 2015-09-27 DIAGNOSIS — E119 Type 2 diabetes mellitus without complications: Secondary | ICD-10-CM | POA: Diagnosis not present

## 2015-09-27 DIAGNOSIS — E039 Hypothyroidism, unspecified: Secondary | ICD-10-CM | POA: Diagnosis not present

## 2015-09-27 DIAGNOSIS — Z7984 Long term (current) use of oral hypoglycemic drugs: Secondary | ICD-10-CM | POA: Diagnosis not present

## 2015-09-27 DIAGNOSIS — M47816 Spondylosis without myelopathy or radiculopathy, lumbar region: Secondary | ICD-10-CM | POA: Diagnosis not present

## 2016-01-10 DIAGNOSIS — M4302 Spondylolysis, cervical region: Secondary | ICD-10-CM | POA: Diagnosis not present

## 2016-01-10 DIAGNOSIS — M47816 Spondylosis without myelopathy or radiculopathy, lumbar region: Secondary | ICD-10-CM | POA: Diagnosis not present

## 2016-02-28 DIAGNOSIS — Z471 Aftercare following joint replacement surgery: Secondary | ICD-10-CM | POA: Diagnosis not present

## 2016-02-28 DIAGNOSIS — Z96652 Presence of left artificial knee joint: Secondary | ICD-10-CM | POA: Diagnosis not present

## 2016-03-11 ENCOUNTER — Other Ambulatory Visit: Payer: Self-pay | Admitting: Nurse Practitioner

## 2016-03-11 ENCOUNTER — Ambulatory Visit
Admission: RE | Admit: 2016-03-11 | Discharge: 2016-03-11 | Disposition: A | Discharge status: Home / Self Care | Payer: Medicare Other | Source: Ambulatory Visit | Attending: Nurse Practitioner | Admitting: Nurse Practitioner

## 2016-03-11 DIAGNOSIS — M47812 Spondylosis without myelopathy or radiculopathy, cervical region: Secondary | ICD-10-CM

## 2016-03-12 DIAGNOSIS — M4302 Spondylolysis, cervical region: Secondary | ICD-10-CM | POA: Diagnosis not present

## 2016-03-14 ENCOUNTER — Other Ambulatory Visit: Payer: Self-pay | Admitting: Neurosurgery

## 2016-03-14 DIAGNOSIS — M47812 Spondylosis without myelopathy or radiculopathy, cervical region: Secondary | ICD-10-CM

## 2016-03-22 DIAGNOSIS — J3089 Other allergic rhinitis: Secondary | ICD-10-CM | POA: Diagnosis not present

## 2016-05-06 DIAGNOSIS — M5481 Occipital neuralgia: Secondary | ICD-10-CM | POA: Diagnosis not present

## 2016-05-06 DIAGNOSIS — M4302 Spondylolysis, cervical region: Secondary | ICD-10-CM | POA: Diagnosis not present

## 2016-05-06 DIAGNOSIS — M1812 Unilateral primary osteoarthritis of first carpometacarpal joint, left hand: Secondary | ICD-10-CM | POA: Diagnosis not present

## 2016-05-06 DIAGNOSIS — M47816 Spondylosis without myelopathy or radiculopathy, lumbar region: Secondary | ICD-10-CM | POA: Diagnosis not present

## 2016-05-20 DIAGNOSIS — E119 Type 2 diabetes mellitus without complications: Secondary | ICD-10-CM | POA: Diagnosis not present

## 2016-05-20 DIAGNOSIS — G4700 Insomnia, unspecified: Secondary | ICD-10-CM | POA: Diagnosis not present

## 2016-05-20 DIAGNOSIS — M1990 Unspecified osteoarthritis, unspecified site: Secondary | ICD-10-CM | POA: Diagnosis not present

## 2016-05-20 DIAGNOSIS — Z23 Encounter for immunization: Secondary | ICD-10-CM | POA: Diagnosis not present

## 2016-05-20 DIAGNOSIS — K219 Gastro-esophageal reflux disease without esophagitis: Secondary | ICD-10-CM | POA: Diagnosis not present

## 2016-05-20 DIAGNOSIS — I10 Essential (primary) hypertension: Secondary | ICD-10-CM | POA: Diagnosis not present

## 2016-05-20 DIAGNOSIS — E039 Hypothyroidism, unspecified: Secondary | ICD-10-CM | POA: Diagnosis not present

## 2016-05-20 DIAGNOSIS — Z0001 Encounter for general adult medical examination with abnormal findings: Secondary | ICD-10-CM | POA: Diagnosis not present

## 2016-05-20 DIAGNOSIS — R0609 Other forms of dyspnea: Secondary | ICD-10-CM | POA: Diagnosis not present

## 2016-05-20 DIAGNOSIS — E785 Hyperlipidemia, unspecified: Secondary | ICD-10-CM | POA: Diagnosis not present

## 2016-05-21 ENCOUNTER — Encounter: Payer: Self-pay | Admitting: Cardiology

## 2016-05-22 ENCOUNTER — Ambulatory Visit (INDEPENDENT_AMBULATORY_CARE_PROVIDER_SITE_OTHER): Payer: Medicare Other | Admitting: Cardiology

## 2016-05-22 ENCOUNTER — Encounter: Payer: Self-pay | Admitting: Cardiology

## 2016-05-22 VITALS — BP 148/84 | HR 67 | Ht 65.0 in | Wt 212.8 lb

## 2016-05-22 DIAGNOSIS — R079 Chest pain, unspecified: Secondary | ICD-10-CM

## 2016-05-22 DIAGNOSIS — E7800 Pure hypercholesterolemia, unspecified: Secondary | ICD-10-CM | POA: Diagnosis not present

## 2016-05-22 DIAGNOSIS — R0600 Dyspnea, unspecified: Secondary | ICD-10-CM | POA: Insufficient documentation

## 2016-05-22 DIAGNOSIS — R0609 Other forms of dyspnea: Secondary | ICD-10-CM | POA: Diagnosis not present

## 2016-05-22 DIAGNOSIS — I10 Essential (primary) hypertension: Secondary | ICD-10-CM | POA: Diagnosis not present

## 2016-05-22 NOTE — Patient Instructions (Addendum)
Medication Instructions:  Your physician recommends that you continue on your current medications as directed. Please refer to the Current Medication list given to you today.   Labwork: None ordered  Testing/Procedures: Your physician has requested that you have en exercise stress myoview. For further information please visit [email or web address]. Please follow instruction sheet, as given.   Follow-Up: Your physician recommends that you schedule a follow-up appointment in: 2 [place], PA-C   Any Other Special Instructions Will Be Listed Below (If Applicable).    If you need a refill on your cardiac medications before your next appointment, please call your pharmacy.

## 2016-05-22 NOTE — Progress Notes (Signed)
05/22/2016 Sheri [name]   11-12-1950  [id number]  Primary Physician [name], MD Primary Cardiologist: [name]   Reason for Visit/CC: PCP referral for New onset Dyspnea  HPI: Patient is a 65 year old female, followed by [name]. She has not been seen since 2015. She was being followed for hypertension and dyslipidemia.  A 2-D echocardiogram in 2015 demonstrated normal left ventricular function with an estimated ejection fraction of 73%. Wall motion was normal. There were no valvular abnormalities. She has no known history of CAD.  She presents to clinic today after being referred by her PCP, [name] from [place]. She has been referred to our office given new onset of dyspnea and left sided neck pain. She also notes radiation to her chest. Feels light a tight band around her chest. Symptoms occur at random with no particular pattern. Sometimes it occurs at rest and sometimes with exertion however there have been several occasions where she has exerted herself a moderate amount without any symptoms. She denies any recent injury or heavy lifting or exercising. She is currently pain-free. EKG shows NSR. No ischemia. VSS.    No outpatient prescriptions have been marked as taking for the 05/22/16 encounter (Office Visit) with Sheri Pandy, PA-C.   Allergies  Allergen Reactions  . Crestor [Rosuvastatin]     Myalgias   . Niaspan [Niacin Er]     Myalgia   . Premarin [Estrogens Conjugated] Other (See Comments)  . Provera [Medroxyprogesterone Acetate]     "makes pt crazy"   Past Medical History:  Diagnosis Date  . Allergic rhinitis   . Anxiety   . Degenerative disc disease, cervical   . Depression   . Diabetes mellitus without complication ([place])    type two  . Diarrhea 04/2012   colon nl, BX NPD; no response to cholestyramin 03-2012 no response to align; transient resp to sucralfate 12-13  . H/O CT scan of abdomen 08/2012   neg; egd  bile reflux gastritisand linear eros; transient resp to sucralfate   . H/O ulcer disease   . Heart murmur    hx of   . History of blood transfusion   . Hyperlipidemia   . Hyperplastic polyps of stomach   . Hypertension   . Hypothyroidism   . IBS (irritable bowel syndrome)   . Kidney stone   . Kissing, osteophytes    Dr Carloyn Manner  . Menopause   . Mild anemia 06/2011   14% iron sat,ferritin 14- from Metformin - anemia resolved   . Obesity   . Osteoarthritis    History reviewed. No pertinent family history. Past Surgical History:  Procedure Laterality Date  . CESAREAN SECTION    . CHOLECYSTECTOMY    . COLONOSCOPY  2/06,04/20/12 repeat 10 years   dr Cristina Gong  . JOINT REPLACEMENT     right knee   . TOTAL KNEE ARTHROPLASTY Left 02/12/2015   Procedure: LEFT TOTAL KNEE ARTHROPLASTY;  Surgeon: Gaynelle Arabian, MD;  Location: [facility];  Service: Orthopedics;  Laterality: Left;   Social History   Social History  . Marital status: Married    Spouse name: [name]  . Number of children: [name]  . Years of education: [name]   Occupational History  . Not on file.   Social History Main Topics  . Smoking status: Never Smoker  . Smokeless tobacco: Never Used  . Alcohol use No  . Drug use: No  . Sexual activity: Not on file   Other  Topics Concern  . Not on file   Social History Narrative  . No narrative on file     Review of Systems: General: negative for chills, fever, night sweats or weight changes.  Cardiovascular: negative for chest pain, dyspnea on exertion, edema, orthopnea, palpitations, paroxysmal nocturnal dyspnea or shortness of breath Dermatological: negative for rash Respiratory: negative for cough or wheezing Urologic: negative for hematuria Abdominal: negative for nausea, vomiting, diarrhea, bright red blood per rectum, melena, or hematemesis Neurologic: negative for visual changes, syncope, or dizziness All other systems reviewed and are otherwise negative except as noted  above.   Physical Exam:  Height '5\' 5"'  (1.651 m).  General appearance: alert, cooperative and no distress Neck: no carotid bruit and no JVD Lungs: clear to auscultation bilaterally Heart: regular rate and rhythm, S1, S2 normal, no murmur, click, rub or gallop Extremities: no LEE Pulses: 2+ and symmetric Skin: warm and dry Neurologic: Grossly normal  EKG NSR. No ischemia.   ASSESSMENT AND PLAN:   1.  Chest Pain + Dyspnea: mixed typical + atypical features. EKG nonischemic. Currently asymptomatic. She does not appear volume overloaded on exam. No s/s of CHF. Given risk factors, will plan for Exercise NST to assess for underlying coronary ischemia.   2. HTN: BP is controlled.   3. HLD: LDL abnormal at 150. Followed by her PCP. She has been intolerant to statins. PCSK-9 inhibitors recommended, however patient refuses.   PLAN  F/u in 2 weeks after stress test.   Lyda Jester PA-C 05/22/2016 2:24 PM

## 2016-05-28 ENCOUNTER — Encounter: Payer: Self-pay | Admitting: Cardiology

## 2016-06-03 ENCOUNTER — Encounter (HOSPITAL_COMMUNITY): Payer: Medicare Other

## 2016-06-11 ENCOUNTER — Ambulatory Visit: Payer: Medicare Other | Admitting: Cardiology

## 2016-06-12 ENCOUNTER — Telehealth (HOSPITAL_COMMUNITY): Payer: Self-pay | Admitting: *Deleted

## 2016-06-12 NOTE — Telephone Encounter (Signed)
Patient given detailed instructions per Myocardial Perfusion Study Information Sheet for the test on 06/17/16. Patient notified to arrive 15 minutes early and that it is imperative to arrive on time for appointment to keep from having the test rescheduled.  If you need to cancel or reschedule your appointment, please call the office within 24 hours of your appointment. Failure to do so may result in a cancellation of your appointment, and a $50 no show fee. Patient verbalized understanding.  Sheri [name] Sheri [name]    

## 2016-06-13 ENCOUNTER — Telehealth: Payer: Self-pay | Admitting: Cardiology

## 2016-06-13 NOTE — Telephone Encounter (Signed)
Called pt back. She wants to cancel her stress test.  She feels that she doesn't need to have it done. She stated that she was unloading her car in [place] in 90 degrees and she didn't have any problems.  She really thinks it was a spell of "anxiety" Pt was advised that we would cancel the stress test.

## 2016-06-13 NOTE — Telephone Encounter (Signed)
New message       Talk to the nurse about her upcoming stress test.  She does not think she need the test.  Pt states she has not had any more symptoms.  Please call

## 2016-06-17 ENCOUNTER — Encounter (HOSPITAL_COMMUNITY): Payer: Medicare Other

## 2016-06-18 DIAGNOSIS — M8588 Other specified disorders of bone density and structure, other site: Secondary | ICD-10-CM | POA: Diagnosis not present

## 2016-06-25 ENCOUNTER — Encounter: Payer: Self-pay | Admitting: Cardiology

## 2016-06-25 ENCOUNTER — Ambulatory Visit (INDEPENDENT_AMBULATORY_CARE_PROVIDER_SITE_OTHER): Payer: Medicare Other | Admitting: Cardiology

## 2016-06-25 VITALS — BP 128/76 | HR 75 | Ht 65.0 in | Wt 216.0 lb

## 2016-06-25 DIAGNOSIS — R0789 Other chest pain: Secondary | ICD-10-CM | POA: Diagnosis not present

## 2016-06-25 NOTE — Patient Instructions (Addendum)
Medication Instructions:  Your physician recommends that you continue on your current medications as directed. Please refer to the Current Medication list given to you today.   Labwork: None ordered  Testing/Procedures: None ordered  Follow-Up: Your physician recommends that you schedule a follow-up appointment in: AS NEEDED   Any Other Special Instructions Will Be Listed Below (If Applicable).     If you need a refill on your cardiac medications before your next appointment, please call your pharmacy.   

## 2016-06-25 NOTE — Progress Notes (Signed)
06/25/2016 Sheri [name]   10-24-1950  [id number]  Primary Physician [name], MD Primary Cardiologist: [name]    Reason for Visit/CC: f/u for Chest Pain  HPI:  Patient is a 65 year old female, followed by [name]. She has not been seen since 2015. She was being followed for hypertension and dyslipidemia.  A 2-D echocardiogram in 2015 demonstrated normal left ventricular function with an estimated ejection fraction of 73%. Wall motion was normal. There were no valvular abnormalities. She has no known history of CAD.  She presented to clinic clinic 1 month ago on 05/22/17  after being referred by her PCP, [name] from [place]. She was referred to our office given new onset of dyspnea and left sided neck pain. At that time, she also noted radiation to her chest. Felt like a tight band around her chest. Symptoms were occurring at random with no particular pattern. Sometimes it would occurs at rest and sometimes with exertion. She was currently pain-free. EKG showd NSR. No ischemia. I ordered for her to undergo a stress test.   She presents back to clinic today for f/u. She notes that she had to go out of town and did not get stress test completed. She wanted to see if she would have any recurrent symptoms. She reports that she has not had any recurrent CP since her office vitis. She has been able to exert herself a moderate amount, preforming house hold chores w/o recurrent symptoms. She denies CP and dyspnea. Her BP is well controlled. She takes low dose ASA daily.    Current Meds  Medication Sig  . acetaminophen (TYLENOL) 650 MG CR tablet Take 1,300 mg by mouth every 8 (eight) hours as needed for pain.  Marland Kitchen amLODipine-benazepril (LOTREL) 5-20 MG per capsule Take 1 capsule by mouth every morning.   Marland Kitchen azithromycin (ZITHROMAX) 250 MG tablet TAKE TWO TABLETS BY MOUTH FIRST DAY THEN 1 TABLET BY MOUTH FOR THE NEXT FOUR DAYS  . diazepam (VALIUM) 5 MG  tablet   . levothyroxine (SYNTHROID, LEVOTHROID) 125 MCG tablet Take 125 mcg by mouth daily.    . meloxicam (MOBIC) 15 MG tablet Take 15 mg by mouth daily.  . pantoprazole (PROTONIX) 40 MG tablet Take 40 mg by mouth daily.   . sitaGLIPtin (JANUVIA) 100 MG tablet Take 100 mg by mouth daily.  . traMADol (ULTRAM) 50 MG tablet Take 1-2 tablets (50-100 mg total) by mouth every 6 (six) hours as needed for moderate pain.   Allergies  Allergen Reactions  . Crestor [Rosuvastatin]     Myalgias   . Niaspan [Niacin Er]     Myalgia   . Premarin [Estrogens Conjugated] Other (See Comments)    Unknown  . Provera [Medroxyprogesterone Acetate]     "makes pt crazy"   Past Medical History:  Diagnosis Date  . Allergic rhinitis   . Anxiety   . Degenerative disc disease, cervical   . Depression   . Diabetes mellitus without complication ([place])    type two  . Diarrhea 04/2012   colon nl, BX NPD; no response to cholestyramin 03-2012 no response to align; transient resp to sucralfate 12-13  . H/O CT scan of abdomen 08/2012   neg; egd bile reflux gastritisand linear eros; transient resp to sucralfate   . H/O ulcer disease   . Heart murmur    hx of   . History of blood transfusion   . Hyperlipidemia   . Hyperplastic polyps of stomach   .  Hypertension   . Hypothyroidism   . IBS (irritable bowel syndrome)   . Kidney stone   . Kissing, osteophytes    Dr Carloyn Manner  . Menopause   . Mild anemia 06/2011   14% iron sat,ferritin 14- from Metformin - anemia resolved   . Obesity   . Osteoarthritis    History reviewed. No pertinent family history. Past Surgical History:  Procedure Laterality Date  . CESAREAN SECTION    . CHOLECYSTECTOMY    . COLONOSCOPY  2/06,04/20/12 repeat 10 years   dr Cristina Gong  . JOINT REPLACEMENT     right knee   . TOTAL KNEE ARTHROPLASTY Left 02/12/2015   Procedure: LEFT TOTAL KNEE ARTHROPLASTY;  Surgeon: Gaynelle Arabian, MD;  Location: [facility];  Service: Orthopedics;  Laterality: Left;     Social History   Social History  . Marital status: Married    Spouse name: [name]  . Number of children: [name]  . Years of education: [name]   Occupational History  . Not on file.   Social History Main Topics  . Smoking status: Never Smoker  . Smokeless tobacco: Never Used  . Alcohol use No  . Drug use: No  . Sexual activity: Not on file   Other Topics Concern  . Not on file   Social History Narrative  . No narrative on file     Review of Systems: General: negative for chills, fever, night sweats or weight changes.  Cardiovascular: negative for chest pain, dyspnea on exertion, edema, orthopnea, palpitations, paroxysmal nocturnal dyspnea or shortness of breath Dermatological: negative for rash Respiratory: negative for cough or wheezing Urologic: negative for hematuria Abdominal: negative for nausea, vomiting, diarrhea, bright red blood per rectum, melena, or hematemesis Neurologic: negative for visual changes, syncope, or dizziness All other systems reviewed and are otherwise negative except as noted above.   Physical Exam:  Blood pressure 128/76, pulse 75, height _0  (1.651 m), weight 216 lb (98 kg), SpO2 98 %.  General appearance: alert, cooperative and no distress Neck: no carotid bruit and no JVD Lungs: clear to auscultation bilaterally Heart: regular rate and rhythm, S1, S2 normal, no murmur, click, rub or gallop Extremities: extremities normal, atraumatic, no cyanosis or edema Pulses: 2+ and symmetric Skin: warm and dry Neurologic: Grossly normal  EKG not preformed   ASSESSMENT AND PLAN:   1. Chest Pain: She denies any further recurrence. Since her last office visit she has been able to exert herself a moderate amount with household chores and denies any recurrent chest pain or dyspnea. Her blood pressure is well-controlled and she is on medications. This is followed by her PCP. We will hold off on plans for stress testing at this time. She will notify our  office if she has any recurrent symptoms. She will continue routine follow-up with her PCP for management of risk factors. It was advised that given her age and history of hypertension that she continue to take low-dose aspirin daily for prevention of cardiovascular events.  PLAN  F/u PRN.   Lyda Jester PA-C 06/25/2016 2:56 PM

## 2016-07-18 DIAGNOSIS — R2689 Other abnormalities of gait and mobility: Secondary | ICD-10-CM | POA: Diagnosis not present

## 2016-07-18 DIAGNOSIS — R262 Difficulty in walking, not elsewhere classified: Secondary | ICD-10-CM | POA: Diagnosis not present

## 2016-07-18 DIAGNOSIS — M545 Low back pain: Secondary | ICD-10-CM | POA: Diagnosis not present

## 2016-07-29 ENCOUNTER — Telehealth (HOSPITAL_COMMUNITY): Payer: Self-pay | Admitting: *Deleted

## 2016-07-29 NOTE — Telephone Encounter (Signed)
Patient given detailed instructions per Myocardial Perfusion Study Information Sheet for the test on 07/31/16. Patient notified to arrive 15 minutes early and that it is imperative to arrive on time for appointment to keep from having the test rescheduled.  If you need to cancel or reschedule your appointment, please call the office within 24 hours of your appointment. Failure to do so may result in a cancellation of your appointment, and a $50 no show fee. Patient verbalized understanding. Hubbard Robinson, RN  \

## 2016-07-31 ENCOUNTER — Ambulatory Visit (HOSPITAL_COMMUNITY): Payer: Medicare Other | Attending: Internal Medicine

## 2016-07-31 DIAGNOSIS — R079 Chest pain, unspecified: Secondary | ICD-10-CM | POA: Insufficient documentation

## 2016-07-31 DIAGNOSIS — R0609 Other forms of dyspnea: Secondary | ICD-10-CM | POA: Diagnosis not present

## 2016-07-31 DIAGNOSIS — R0600 Dyspnea, unspecified: Secondary | ICD-10-CM

## 2016-07-31 MED ORDER — TECHNETIUM TC 99M TETROFOSMIN IV KIT
31.4000 | PACK | Freq: Once | INTRAVENOUS | Status: AC | PRN
  Administered 2016-07-31: 31.4 via INTRAVENOUS
  Filled 2016-07-31: qty 32

## 2016-08-12 ENCOUNTER — Ambulatory Visit (HOSPITAL_COMMUNITY): Payer: Medicare Other | Attending: Cardiology

## 2016-08-12 LAB — MYOCARDIAL PERFUSION IMAGING
Exercise duration (min): 4 min
Exercise duration (sec): 18 s
LV dias vol: 77 mL (ref 46–106)
LV sys vol: 26 mL
MPHR: 154 {beats}/min
Peak HR: 136 {beats}/min
Percent HR: 88 %
RATE: 0.34
Rest HR: 75 {beats}/min
SDS: 8
SRS: 6
SSS: 10
TID: 1.02

## 2016-08-12 MED ORDER — TECHNETIUM TC 99M TETROFOSMIN IV KIT
32.5000 | PACK | Freq: Once | INTRAVENOUS | Status: AC | PRN
  Administered 2016-08-12: 32.5 via INTRAVENOUS
  Filled 2016-08-12: qty 33

## 2016-09-09 DIAGNOSIS — M1811 Unilateral primary osteoarthritis of first carpometacarpal joint, right hand: Secondary | ICD-10-CM | POA: Diagnosis not present

## 2016-09-09 DIAGNOSIS — M1812 Unilateral primary osteoarthritis of first carpometacarpal joint, left hand: Secondary | ICD-10-CM | POA: Diagnosis not present

## 2016-10-28 ENCOUNTER — Ambulatory Visit (INDEPENDENT_AMBULATORY_CARE_PROVIDER_SITE_OTHER): Payer: Medicare Other | Admitting: Cardiology

## 2016-10-28 ENCOUNTER — Encounter: Payer: Self-pay | Admitting: Cardiology

## 2016-10-28 VITALS — BP 124/80 | HR 75 | Ht 65.0 in | Wt 215.8 lb

## 2016-10-28 DIAGNOSIS — R0789 Other chest pain: Secondary | ICD-10-CM | POA: Diagnosis not present

## 2016-10-28 MED ORDER — ROSUVASTATIN CALCIUM 5 MG PO TABS: 5.0000 mg | ORAL_TABLET | Freq: Every day | ORAL | 3 refills | Status: DC

## 2016-10-28 NOTE — Patient Instructions (Addendum)
Medication Instructions:  Your physician has recommended you make the following change in your medication:  1.  RESTART the Crestor 5 mg take 1 tablet daily   Labwork: None ordered  Testing/Procedures: None ordered  Follow-Up: Your physician recommends that you schedule a follow-up appointment in: AS NEEDED WITH [name]   Any Other Special Instructions Will Be Listed Below (If Applicable).     If you need a refill on your cardiac medications before your next appointment, please call your pharmacy.  s Esophageal Spasm An esophageal spasm is a muscle spasm of the tube that connects the back of your throat to your stomach (esophagus). Your esophagus normally moves food and liquids down into your stomach with smooth, wavelike muscle contractions. If you have esophageal spasms, abnormal muscle contractions in your esophagus can be painful and cause you to have difficulty swallowing (dysphagia). There are two types of esophageal spasms. You may have one or both types:  Diffuse esophageal spasms. These are irregular, uncoordinated muscle movements of the esophagus. The diffuse type causes more dysphagia.  Nutcracker esophagus. This is a type of muscle contraction that is coordinated but too strong. The muscles move in a regular order, but the contraction is stronger than necessary. The nutcracker type of esophageal spasm is more painful. Severe cases of esophageal spasm can make it hard to eat well and do all your usual activities. Esophageal spasms often occur along with severe heartburn (reflux esophagitis). Swallowed foods or liquids may also come back up into your throat (regurgitation). What are the causes? The cause of esophageal spasm is not known. What increases the risk? You may have a higher risk for esophageal spasm if you:  Are female.  Are an older person.  Eat very quickly.  Swallow foods or drinks that are very hot or very cold.  Are depressed or anxious. What  are the signs or symptoms? Signs and symptoms can vary from day to day. They may be mild or severe. They may last for minutes or hours. Common signs and symptoms include:  Chest pain. This may feel like a heart attack.  Back pain.  Dysphagia.  Heartburn.  The feeling that something is stuck in your throat (globus).  Regurgitation of foods or liquids. How is this diagnosed? Your health care provider may suspect esophageal spasm based on your symptoms and a physical exam. Tests may be done to help confirm the diagnosis. These may include:  Endoscopy. A flexible telescope is passed into your esophagus.  Barium swallow. An X-ray is done while you drink a substance (contrast material) that shows up on X-ray.  Esophageal manometry. Pressure measurements of the inside of your esophagus are taken while you swallow. How is this treated? Mild cases of esophageal spasms may not need to be treated. You may be able to manage the spasms by avoiding the foods and eating habits that trigger them. Treatment for spasms that are more severe or frequent can include the following:  You may be given medicine to:  Relax the esophageal muscles.  Relieve muscle spasms (calcium channel blockers and nitrates).  Block nerve endings in the esophagus. This is done with an injection of a certain toxin (botulinum).  Relieve heartburn (proton pump inhibitors).  Antidepressant medicines are sometimes used to ease symptoms.  For severe cases, surgery is sometimes done to reduce esophageal muscle contractions (myotomy). Follow these instructions at home:  Take medicines only as directed by your health care provider.  Do not eat foods that trigger  heartburn or esophageal spasms.  Eat your meals slowly.  Chew your food completely.  Avoid foods and drinks that are very hot or very cold.  Find ways to manage stress.  Ask for help if you struggle with depression or anxiety.  Keep all follow-up visits as  directed by your health care provider. This is important. Contact a health care provider if:  Your symptoms change or get worse.  You are losing weight because of dysphagia.  Your medicine is not helping your symptoms.  Your esophageal spasms are interfering with your quality of life. Get help right away if:  You have very bad or unusual chest pain.  You have trouble breathing.  You choke. This information is not intended to replace advice given to you by your health care provider. Make sure you discuss any questions you have with your health care provider. Document Released: 11/08/2002 Document Revised: 01/24/2016 Document Reviewed: 11/11/2013 Elsevier Interactive Patient Education  2017 [employer].

## 2016-10-28 NOTE — Progress Notes (Signed)
10/28/2016 Sheri [name]   18-Nov-1950  [id number]  Primary Physician [name], MD Primary Cardiologist: [name]    Reason for Visit/CC: neck and shoulder pain  HPI:  Patientis a 66 year old female, followed by [name]. She has not been by her seen since 2015. She was being followed for hypertension and dyslipidemia. A 2-D echocardiogram in 2015 demonstrated normal left ventricular function with an estimated ejection fraction of 73%. Wall motion was normal. There were no valvular abnormalities. Shehas no known history of CAD.  I saw her in 06/2016 for CP and exertional dyspnea.  EKG showd NSR. No ischemia. I ordered for her to undergo a stress test. This was a low risk stress test w/o ischemia. LVEF was normal. After her stress test she was doing well for a period of time w/o recurrent symptoms.   She presents back given recurrent anterior and posterior neck pain radiating out to her shoulders. No particular pattern associated with this. It can occur at rest and some with moderate exertion. Feels like tightness, but only last 10-15 sec at a time. No prolonged symptoms. No other associated symptoms. Not worse after meals however she does have some difficulties swallowing certain foods (bagles and french fries). She is currently pain free. Her last episode was over 1 week ago. She does have a h/o GERD and is on Protonix, but has not been fully compliant with this over the last several months.   Current Meds  Medication Sig  . acetaminophen (TYLENOL) 650 MG CR tablet Take 1,300 mg by mouth every 8 (eight) hours as needed for pain.  Marland Kitchen amLODipine-benazepril (LOTREL) 5-20 MG per capsule Take 1 capsule by mouth every morning.   Marland Kitchen levothyroxine (SYNTHROID, LEVOTHROID) 125 MCG tablet Take 125 mcg by mouth daily.    . meloxicam (MOBIC) 15 MG tablet Take 15 mg by mouth daily.  . pantoprazole (PROTONIX) 40 MG tablet Take 40 mg by mouth daily.   . sitaGLIPtin (JANUVIA) 100 MG  tablet Take 100 mg by mouth daily.  . traMADol (ULTRAM) 50 MG tablet Take 1-2 tablets (50-100 mg total) by mouth every 6 (six) hours as needed for moderate pain.  . [DISCONTINUED] azithromycin (ZITHROMAX) 250 MG tablet TAKE TWO TABLETS BY MOUTH FIRST DAY THEN 1 TABLET BY MOUTH FOR THE NEXT FOUR DAYS  . [DISCONTINUED] diazepam (VALIUM) 5 MG tablet    Allergies  Allergen Reactions  . Crestor [Rosuvastatin]     Myalgias   . Niaspan [Niacin Er]     Myalgia   . Premarin [Estrogens Conjugated] Other (See Comments)    Unknown  . Provera [Medroxyprogesterone Acetate]     "makes pt crazy"   Past Medical History:  Diagnosis Date  . Allergic rhinitis   . Anxiety   . Degenerative disc disease, cervical   . Depression   . Diabetes mellitus without complication ([place])    type two  . Diarrhea 04/2012   colon nl, BX NPD; no response to cholestyramin 03-2012 no response to align; transient resp to sucralfate 12-13  . H/O CT scan of abdomen 08/2012   neg; egd bile reflux gastritisand linear eros; transient resp to sucralfate   . H/O ulcer disease   . Heart murmur    hx of   . History of blood transfusion   . Hyperlipidemia   . Hyperplastic polyps of stomach   . Hypertension   . Hypothyroidism   . IBS (irritable bowel syndrome)   . Kidney stone   .  Kissing, osteophytes    Dr Carloyn Manner  . Menopause   . Mild anemia 06/2011   14% iron sat,ferritin 14- from Metformin - anemia resolved   . Obesity   . Osteoarthritis    History reviewed. No pertinent family history. Past Surgical History:  Procedure Laterality Date  . CESAREAN SECTION    . CHOLECYSTECTOMY    . COLONOSCOPY  2/06,04/20/12 repeat 10 years   dr Cristina Gong  . JOINT REPLACEMENT     right knee   . TOTAL KNEE ARTHROPLASTY Left 02/12/2015   Procedure: LEFT TOTAL KNEE ARTHROPLASTY;  Surgeon: Gaynelle Arabian, MD;  Location: [facility];  Service: Orthopedics;  Laterality: Left;   Social History   Social History  . Marital status: Married     Spouse name: [name]  . Number of children: [name]  . Years of education: [name]   Occupational History  . Not on file.   Social History Main Topics  . Smoking status: Never Smoker  . Smokeless tobacco: Never Used  . Alcohol use No  . Drug use: No  . Sexual activity: Not on file   Other Topics Concern  . Not on file   Social History Narrative  . No narrative on file     Review of Systems: General: negative for chills, fever, night sweats or weight changes.  Cardiovascular: negative for chest pain, dyspnea on exertion, edema, orthopnea, palpitations, paroxysmal nocturnal dyspnea or shortness of breath Dermatological: negative for rash Respiratory: negative for cough or wheezing Urologic: negative for hematuria Abdominal: negative for nausea, vomiting, diarrhea, bright red blood per rectum, melena, or hematemesis Neurologic: negative for visual changes, syncope, or dizziness All other systems reviewed and are otherwise negative except as noted above.   Physical Exam:  Blood pressure 124/80, pulse 75, height '5\' 5"'  (1.651 m), weight 215 lb 12 oz (97.9 kg), SpO2 98 %.  General appearance: alert, cooperative and no distress Neck: no carotid bruit and no JVD Lungs: clear to auscultation bilaterally Heart: regular rate and rhythm, S1, S2 normal, no murmur, click, rub or gallop Extremities: extremities normal, atraumatic, no cyanosis or edema Pulses: 2+ and symmetric Skin: Skin color, texture, turgor normal. No rashes or lesions Neurologic: Grossly normal  EKG not performed  ASSESSMENT AND PLAN:   1. Neck and Shoulder pain: Symptoms are brief, lasting 10-15 seconds at a time. She denies any prolonged symptoms that fail to respond spontaneously. She recently had a low risk exercise NST 07/2016. No ischemia noted. ? Coronary vasospasm or esophageal spasms (also notes certain foods are hard to swallow including bagels and french fries). She is currently pain free and has not had any symptoms  within the last week. I recommended increasing the amlodipine dose of her Lotrel to 10 mg vs adding low dose Imdur. Pt reports symptoms have been getting better and not present over the last week, so she will see how she does and will contact us if any recurrence. Pt advised to report any change in symptoms, more severe and lasting longer than 10-15 secs. Would be more concerned if she had symptoms lasting 5-30 minutes at a time. I also advised that she improve compliance with her Protonix.     Lyda Jester [facility] 10/28/2016 9:43 AM

## 2016-11-04 DIAGNOSIS — Z79899 Other long term (current) drug therapy: Secondary | ICD-10-CM | POA: Diagnosis not present

## 2016-11-04 DIAGNOSIS — R5383 Other fatigue: Secondary | ICD-10-CM | POA: Diagnosis not present

## 2016-11-04 DIAGNOSIS — Z6836 Body mass index (BMI) 36.0-36.9, adult: Secondary | ICD-10-CM | POA: Diagnosis not present

## 2016-11-04 DIAGNOSIS — I10 Essential (primary) hypertension: Secondary | ICD-10-CM | POA: Diagnosis not present

## 2016-11-04 DIAGNOSIS — R1310 Dysphagia, unspecified: Secondary | ICD-10-CM | POA: Diagnosis not present

## 2016-11-04 DIAGNOSIS — E039 Hypothyroidism, unspecified: Secondary | ICD-10-CM | POA: Diagnosis not present

## 2016-11-04 DIAGNOSIS — R635 Abnormal weight gain: Secondary | ICD-10-CM | POA: Diagnosis not present

## 2016-12-15 ENCOUNTER — Other Ambulatory Visit (HOSPITAL_COMMUNITY)
Admission: RE | Admit: 2016-12-15 | Discharge: 2016-12-15 | Disposition: A | Discharge status: Home / Self Care | Payer: Medicare Other | Source: Ambulatory Visit | Attending: Family Medicine | Admitting: Family Medicine

## 2016-12-15 ENCOUNTER — Other Ambulatory Visit: Payer: Self-pay | Admitting: Family Medicine

## 2016-12-15 DIAGNOSIS — E039 Hypothyroidism, unspecified: Secondary | ICD-10-CM | POA: Diagnosis not present

## 2016-12-15 DIAGNOSIS — Z124 Encounter for screening for malignant neoplasm of cervix: Secondary | ICD-10-CM | POA: Insufficient documentation

## 2016-12-15 DIAGNOSIS — E119 Type 2 diabetes mellitus without complications: Secondary | ICD-10-CM | POA: Diagnosis not present

## 2016-12-15 DIAGNOSIS — I10 Essential (primary) hypertension: Secondary | ICD-10-CM | POA: Diagnosis not present

## 2016-12-15 DIAGNOSIS — K219 Gastro-esophageal reflux disease without esophagitis: Secondary | ICD-10-CM | POA: Diagnosis not present

## 2016-12-17 DIAGNOSIS — M79642 Pain in left hand: Secondary | ICD-10-CM | POA: Diagnosis not present

## 2016-12-17 DIAGNOSIS — M79641 Pain in right hand: Secondary | ICD-10-CM | POA: Diagnosis not present

## 2016-12-17 DIAGNOSIS — G5603 Carpal tunnel syndrome, bilateral upper limbs: Secondary | ICD-10-CM | POA: Diagnosis not present

## 2016-12-17 DIAGNOSIS — M180 Bilateral primary osteoarthritis of first carpometacarpal joints: Secondary | ICD-10-CM | POA: Diagnosis not present

## 2016-12-17 LAB — CYTOLOGY - PAP: Diagnosis: NEGATIVE

## 2016-12-23 DIAGNOSIS — M47816 Spondylosis without myelopathy or radiculopathy, lumbar region: Secondary | ICD-10-CM | POA: Diagnosis not present

## 2017-01-22 ENCOUNTER — Inpatient Hospital Stay (HOSPITAL_COMMUNITY)
Admission: AD | Admit: 2017-01-22 | Discharge: 2017-01-25 | DRG: 468 | Disposition: A | Discharge status: Home Health | Payer: Medicare Other | Source: Ambulatory Visit | Attending: Orthopedic Surgery | Admitting: Orthopedic Surgery

## 2017-01-22 ENCOUNTER — Inpatient Hospital Stay (HOSPITAL_COMMUNITY): Payer: Medicare Other | Admitting: Certified Registered"

## 2017-01-22 ENCOUNTER — Encounter (HOSPITAL_COMMUNITY)
Admission: AD | Disposition: A | Discharge status: Home Health | Payer: Self-pay | Source: Ambulatory Visit | Attending: Orthopedic Surgery

## 2017-01-22 ENCOUNTER — Encounter (HOSPITAL_COMMUNITY): Payer: Self-pay | Admitting: Certified Registered"

## 2017-01-22 DIAGNOSIS — E039 Hypothyroidism, unspecified: Secondary | ICD-10-CM | POA: Diagnosis not present

## 2017-01-22 DIAGNOSIS — Z471 Aftercare following joint replacement surgery: Secondary | ICD-10-CM | POA: Diagnosis not present

## 2017-01-22 DIAGNOSIS — A491 Streptococcal infection, unspecified site: Secondary | ICD-10-CM

## 2017-01-22 DIAGNOSIS — Z96652 Presence of left artificial knee joint: Secondary | ICD-10-CM | POA: Diagnosis present

## 2017-01-22 DIAGNOSIS — E7800 Pure hypercholesterolemia, unspecified: Secondary | ICD-10-CM | POA: Diagnosis not present

## 2017-01-22 DIAGNOSIS — Z79899 Other long term (current) drug therapy: Secondary | ICD-10-CM

## 2017-01-22 DIAGNOSIS — F329 Major depressive disorder, single episode, unspecified: Secondary | ICD-10-CM | POA: Diagnosis not present

## 2017-01-22 DIAGNOSIS — R6883 Chills (without fever): Secondary | ICD-10-CM

## 2017-01-22 DIAGNOSIS — F419 Anxiety disorder, unspecified: Secondary | ICD-10-CM | POA: Diagnosis not present

## 2017-01-22 DIAGNOSIS — E785 Hyperlipidemia, unspecified: Secondary | ICD-10-CM | POA: Diagnosis present

## 2017-01-22 DIAGNOSIS — M25561 Pain in right knee: Secondary | ICD-10-CM | POA: Diagnosis not present

## 2017-01-22 DIAGNOSIS — E119 Type 2 diabetes mellitus without complications: Secondary | ICD-10-CM | POA: Diagnosis not present

## 2017-01-22 DIAGNOSIS — Z96651 Presence of right artificial knee joint: Secondary | ICD-10-CM | POA: Diagnosis not present

## 2017-01-22 DIAGNOSIS — B9689 Other specified bacterial agents as the cause of diseases classified elsewhere: Secondary | ICD-10-CM | POA: Diagnosis not present

## 2017-01-22 DIAGNOSIS — D649 Anemia, unspecified: Secondary | ICD-10-CM | POA: Diagnosis not present

## 2017-01-22 DIAGNOSIS — M00261 Other streptococcal arthritis, right knee: Secondary | ICD-10-CM | POA: Diagnosis not present

## 2017-01-22 DIAGNOSIS — Y792 Prosthetic and other implants, materials and accessory orthopedic devices associated with adverse incidents: Secondary | ICD-10-CM | POA: Diagnosis not present

## 2017-01-22 DIAGNOSIS — B951 Streptococcus, group B, as the cause of diseases classified elsewhere: Secondary | ICD-10-CM | POA: Diagnosis not present

## 2017-01-22 DIAGNOSIS — T8453XA Infection and inflammatory reaction due to internal right knee prosthesis, initial encounter: Secondary | ICD-10-CM | POA: Diagnosis not present

## 2017-01-22 DIAGNOSIS — I10 Essential (primary) hypertension: Secondary | ICD-10-CM | POA: Diagnosis not present

## 2017-01-22 DIAGNOSIS — M170 Bilateral primary osteoarthritis of knee: Secondary | ICD-10-CM | POA: Diagnosis not present

## 2017-01-22 DIAGNOSIS — T8453XD Infection and inflammatory reaction due to internal right knee prosthesis, subsequent encounter: Secondary | ICD-10-CM | POA: Diagnosis not present

## 2017-01-22 DIAGNOSIS — Z888 Allergy status to other drugs, medicaments and biological substances status: Secondary | ICD-10-CM | POA: Diagnosis not present

## 2017-01-22 DIAGNOSIS — Z9049 Acquired absence of other specified parts of digestive tract: Secondary | ICD-10-CM | POA: Diagnosis not present

## 2017-01-22 DIAGNOSIS — M67861 Other specified disorders of synovium, right knee: Secondary | ICD-10-CM | POA: Diagnosis not present

## 2017-01-22 DIAGNOSIS — Y831 Surgical operation with implant of artificial internal device as the cause of abnormal reaction of the patient, or of later complication, without mention of misadventure at the time of the procedure: Secondary | ICD-10-CM | POA: Diagnosis present

## 2017-01-22 DIAGNOSIS — M00861 Arthritis due to other bacteria, right knee: Secondary | ICD-10-CM | POA: Diagnosis not present

## 2017-01-22 HISTORY — PX: I & D KNEE WITH POLY EXCHANGE: SHX5024

## 2017-01-22 LAB — SURGICAL PCR SCREEN
MRSA, PCR: NEGATIVE
Staphylococcus aureus: NEGATIVE

## 2017-01-22 LAB — BASIC METABOLIC PANEL
Anion gap: 13 (ref 5–15)
BUN: 29 mg/dL — ABNORMAL HIGH (ref 6–20)
CO2: 23 mmol/L (ref 22–32)
Calcium: 10.4 mg/dL — ABNORMAL HIGH (ref 8.9–10.3)
Chloride: 100 mmol/L — ABNORMAL LOW (ref 101–111)
Creatinine, Ser: 1.35 mg/dL — ABNORMAL HIGH (ref 0.44–1.00)
GFR calc Af Amer: 46 mL/min — ABNORMAL LOW (ref >60)
GFR calc non Af Amer: 40 mL/min — ABNORMAL LOW (ref >60)
Glucose, Bld: 140 mg/dL — ABNORMAL HIGH (ref 65–99)
Potassium: 4.4 mmol/L (ref 3.5–5.1)
Sodium: 136 mmol/L (ref 135–145)

## 2017-01-22 LAB — HEMOGLOBIN: Hemoglobin: 12.6 g/dL (ref 12.0–15.0)

## 2017-01-22 LAB — TYPE AND SCREEN
ABO/RH(D): A POS
Antibody Screen: NEGATIVE

## 2017-01-22 LAB — GLUCOSE, CAPILLARY: Glucose-Capillary: 128 mg/dL — ABNORMAL HIGH (ref 65–99)

## 2017-01-22 SURGERY — IRRIGATION AND DEBRIDEMENT KNEE WITH POLY EXCHANGE
Anesthesia: Choice | Laterality: Right

## 2017-01-22 SURGERY — IRRIGATION AND DEBRIDEMENT KNEE WITH POLY EXCHANGE
Anesthesia: General | Laterality: Right

## 2017-01-22 MED ORDER — PHENYLEPHRINE 40 MCG/ML (10ML) SYRINGE FOR IV PUSH (FOR BLOOD PRESSURE SUPPORT)
PREFILLED_SYRINGE | INTRAVENOUS | Status: DC | PRN
  Administered 2017-01-22 (×2): 80 ug via INTRAVENOUS
  Administered 2017-01-22: 120 ug via INTRAVENOUS
  Administered 2017-01-22 (×2): 80 ug via INTRAVENOUS
  Administered 2017-01-22: 120 ug via INTRAVENOUS
  Administered 2017-01-22: 80 ug via INTRAVENOUS
  Administered 2017-01-22: 40 ug via INTRAVENOUS
  Administered 2017-01-22: 80 ug via INTRAVENOUS
  Administered 2017-01-22: 120 ug via INTRAVENOUS
  Administered 2017-01-22: 80 ug via INTRAVENOUS
  Administered 2017-01-22: 120 ug via INTRAVENOUS
  Administered 2017-01-22: 80 ug via INTRAVENOUS

## 2017-01-22 MED ORDER — PANTOPRAZOLE SODIUM 40 MG PO TBEC
40.0000 mg | DELAYED_RELEASE_TABLET | Freq: Every day | ORAL | Status: DC
  Administered 2017-01-23 – 2017-01-25 (×3): 40 mg via ORAL
  Filled 2017-01-22 (×3): qty 1

## 2017-01-22 MED ORDER — METOCLOPRAMIDE HCL 5 MG/ML IJ SOLN
5.0000 mg | Freq: Three times a day (TID) | INTRAMUSCULAR | Status: DC | PRN
Start: 2017-01-22 — End: 2017-01-25

## 2017-01-22 MED ORDER — SODIUM CHLORIDE 0.9 % IV SOLN
INTRAVENOUS | Status: DC
  Administered 2017-01-22 – 2017-01-24 (×3): via INTRAVENOUS

## 2017-01-22 MED ORDER — VANCOMYCIN HCL 1000 MG IV SOLR
INTRAVENOUS | Status: DC | PRN
  Administered 2017-01-22: 1000 mg via INTRAVENOUS

## 2017-01-22 MED ORDER — ALPRAZOLAM 0.25 MG PO TABS
0.2500 mg | ORAL_TABLET | Freq: Two times a day (BID) | ORAL | Status: DC | PRN
  Filled 2017-01-22: qty 1

## 2017-01-22 MED ORDER — DEXAMETHASONE SODIUM PHOSPHATE 10 MG/ML IJ SOLN
10.0000 mg | Freq: Once | INTRAMUSCULAR | Status: AC
  Administered 2017-01-23: 10 mg via INTRAVENOUS
  Filled 2017-01-22: qty 1

## 2017-01-22 MED ORDER — BISACODYL 10 MG RE SUPP: 10.0000 mg | Freq: Every day | RECTAL | Status: DC | PRN

## 2017-01-22 MED ORDER — PROPOFOL 10 MG/ML IV BOLUS
INTRAVENOUS | Status: AC
  Filled 2017-01-22: qty 20

## 2017-01-22 MED ORDER — HYDROMORPHONE HCL 1 MG/ML IJ SOLN: 0.2500 mg | INTRAMUSCULAR | Status: DC | PRN

## 2017-01-22 MED ORDER — HYDROCODONE-ACETAMINOPHEN 7.5-325 MG PO TABS
1.0000 | ORAL_TABLET | ORAL | Status: DC
  Administered 2017-01-22 (×2): 1 via ORAL
  Administered 2017-01-23 (×2): 2 via ORAL
  Filled 2017-01-22 (×2): qty 2
  Filled 2017-01-22: qty 1

## 2017-01-22 MED ORDER — CELECOXIB 200 MG PO CAPS
200.0000 mg | ORAL_CAPSULE | Freq: Two times a day (BID) | ORAL | Status: DC
  Administered 2017-01-22 – 2017-01-23 (×2): 200 mg via ORAL
  Filled 2017-01-22 (×5): qty 1

## 2017-01-22 MED ORDER — LIDOCAINE 2% (20 MG/ML) 5 ML SYRINGE
INTRAMUSCULAR | Status: AC
  Filled 2017-01-22: qty 5

## 2017-01-22 MED ORDER — MAGNESIUM CITRATE PO SOLN: 1.0000 | Freq: Once | ORAL | Status: DC | PRN

## 2017-01-22 MED ORDER — FENTANYL CITRATE (PF) 100 MCG/2ML IJ SOLN
25.0000 ug | INTRAMUSCULAR | Status: DC | PRN
Start: 2017-01-22 — End: 2017-01-25
  Administered 2017-01-22 – 2017-01-23 (×4): 50 ug via INTRAVENOUS
  Filled 2017-01-22 (×4): qty 2

## 2017-01-22 MED ORDER — OXYCODONE HCL 5 MG PO TABS: 5.0000 mg | ORAL_TABLET | Freq: Once | ORAL | Status: DC | PRN

## 2017-01-22 MED ORDER — EPHEDRINE SULFATE-NACL 50-0.9 MG/10ML-% IV SOSY
PREFILLED_SYRINGE | INTRAVENOUS | Status: DC | PRN
  Administered 2017-01-22: 10 mg via INTRAVENOUS

## 2017-01-22 MED ORDER — RIFAMPIN 300 MG PO CAPS
300.0000 mg | ORAL_CAPSULE | Freq: Two times a day (BID) | ORAL | Status: DC
  Administered 2017-01-22 – 2017-01-24 (×4): 300 mg via ORAL
  Filled 2017-01-22 (×4): qty 1

## 2017-01-22 MED ORDER — PROPOFOL 500 MG/50ML IV EMUL
INTRAVENOUS | Status: DC | PRN
  Administered 2017-01-22: 50 ug/kg/min via INTRAVENOUS

## 2017-01-22 MED ORDER — MIDAZOLAM HCL 5 MG/5ML IJ SOLN
INTRAMUSCULAR | Status: DC | PRN
  Administered 2017-01-22: 2 mg via INTRAVENOUS

## 2017-01-22 MED ORDER — ALUM & MAG HYDROXIDE-SIMETH 200-200-20 MG/5ML PO SUSP: 15.0000 mL | ORAL | Status: DC | PRN

## 2017-01-22 MED ORDER — LACTATED RINGERS IV SOLN
INTRAVENOUS | Status: DC
  Administered 2017-01-22 (×2): via INTRAVENOUS

## 2017-01-22 MED ORDER — PROMETHAZINE HCL 25 MG/ML IJ SOLN: 6.2500 mg | INTRAMUSCULAR | Status: DC | PRN

## 2017-01-22 MED ORDER — ONDANSETRON HCL 4 MG PO TABS: 4.0000 mg | ORAL_TABLET | Freq: Four times a day (QID) | ORAL | Status: DC | PRN

## 2017-01-22 MED ORDER — SODIUM CHLORIDE 0.9 % IR SOLN
Status: DC | PRN
  Administered 2017-01-22: 7000 mL

## 2017-01-22 MED ORDER — HYDROCODONE-ACETAMINOPHEN 7.5-325 MG PO TABS
ORAL_TABLET | ORAL | Status: AC
  Filled 2017-01-22: qty 1

## 2017-01-22 MED ORDER — METOCLOPRAMIDE HCL 5 MG PO TABS
5.0000 mg | ORAL_TABLET | Freq: Three times a day (TID) | ORAL | Status: DC | PRN
Start: 2017-01-22 — End: 2017-01-25

## 2017-01-22 MED ORDER — MEPERIDINE HCL 50 MG/ML IJ SOLN: 6.2500 mg | INTRAMUSCULAR | Status: DC | PRN

## 2017-01-22 MED ORDER — LINAGLIPTIN 5 MG PO TABS
5.0000 mg | ORAL_TABLET | Freq: Every day | ORAL | Status: DC
  Administered 2017-01-23 – 2017-01-25 (×3): 5 mg via ORAL
  Filled 2017-01-22 (×3): qty 1

## 2017-01-22 MED ORDER — OXYCODONE HCL 5 MG/5ML PO SOLN: 5.0000 mg | Freq: Once | ORAL | Status: DC | PRN

## 2017-01-22 MED ORDER — BUPIVACAINE HCL (PF) 0.75 % IJ SOLN
INTRAMUSCULAR | Status: DC | PRN
  Administered 2017-01-22: 2 mL via INTRATHECAL

## 2017-01-22 MED ORDER — ONDANSETRON HCL 4 MG/2ML IJ SOLN
INTRAMUSCULAR | Status: DC | PRN
  Administered 2017-01-22: 4 mg via INTRAVENOUS

## 2017-01-22 MED ORDER — PROPOFOL 10 MG/ML IV BOLUS
INTRAVENOUS | Status: DC | PRN
  Administered 2017-01-22 (×3): 20 mg via INTRAVENOUS

## 2017-01-22 MED ORDER — DOCUSATE SODIUM 100 MG PO CAPS
100.0000 mg | ORAL_CAPSULE | Freq: Two times a day (BID) | ORAL | Status: DC
  Administered 2017-01-22 – 2017-01-25 (×5): 100 mg via ORAL
  Filled 2017-01-22 (×6): qty 1

## 2017-01-22 MED ORDER — KETOROLAC TROMETHAMINE 30 MG/ML IJ SOLN
INTRAMUSCULAR | Status: AC
  Filled 2017-01-22: qty 1

## 2017-01-22 MED ORDER — MIDAZOLAM HCL 2 MG/2ML IJ SOLN
INTRAMUSCULAR | Status: AC
  Filled 2017-01-22: qty 2

## 2017-01-22 MED ORDER — BUPIVACAINE HCL (PF) 0.25 % IJ SOLN
INTRAMUSCULAR | Status: AC
  Filled 2017-01-22: qty 30

## 2017-01-22 MED ORDER — SODIUM CHLORIDE 0.9 % IJ SOLN
INTRAMUSCULAR | Status: AC
  Filled 2017-01-22: qty 50

## 2017-01-22 MED ORDER — ONDANSETRON HCL 4 MG/2ML IJ SOLN
INTRAMUSCULAR | Status: AC
  Filled 2017-01-22: qty 2

## 2017-01-22 MED ORDER — LIDOCAINE 2% (20 MG/ML) 5 ML SYRINGE
INTRAMUSCULAR | Status: DC | PRN
  Administered 2017-01-22: 20 mg via INTRAVENOUS

## 2017-01-22 MED ORDER — PROPOFOL 10 MG/ML IV BOLUS
INTRAVENOUS | Status: AC
  Filled 2017-01-22: qty 40

## 2017-01-22 MED ORDER — EPHEDRINE 5 MG/ML INJ
INTRAVENOUS | Status: AC
  Filled 2017-01-22: qty 10

## 2017-01-22 MED ORDER — ASPIRIN 81 MG PO CHEW
81.0000 mg | CHEWABLE_TABLET | Freq: Two times a day (BID) | ORAL | Status: DC
  Administered 2017-01-23 – 2017-01-25 (×5): 81 mg via ORAL
  Filled 2017-01-22 (×5): qty 1

## 2017-01-22 MED ORDER — VANCOMYCIN HCL IN DEXTROSE 1-5 GM/200ML-% IV SOLN
INTRAVENOUS | Status: AC
  Filled 2017-01-22: qty 200

## 2017-01-22 MED ORDER — FENTANYL CITRATE (PF) 100 MCG/2ML IJ SOLN
INTRAMUSCULAR | Status: AC
  Filled 2017-01-22: qty 2

## 2017-01-22 MED ORDER — LEVOTHYROXINE SODIUM 112 MCG PO TABS
112.0000 ug | ORAL_TABLET | Freq: Every day | ORAL | Status: DC
  Administered 2017-01-23 – 2017-01-25 (×3): 112 ug via ORAL
  Filled 2017-01-22 (×3): qty 1

## 2017-01-22 MED ORDER — POLYETHYLENE GLYCOL 3350 17 G PO PACK
17.0000 g | PACK | Freq: Two times a day (BID) | ORAL | Status: DC
  Administered 2017-01-22 – 2017-01-24 (×4): 17 g via ORAL
  Filled 2017-01-22 (×6): qty 1

## 2017-01-22 MED ORDER — MENTHOL 3 MG MT LOZG: 1.0000 | LOZENGE | OROMUCOSAL | Status: DC | PRN

## 2017-01-22 MED ORDER — PHENOL 1.4 % MT LIQD: 1.0000 | OROMUCOSAL | Status: DC | PRN

## 2017-01-22 MED ORDER — VANCOMYCIN HCL IN DEXTROSE 750-5 MG/150ML-% IV SOLN
750.0000 mg | Freq: Two times a day (BID) | INTRAVENOUS | Status: DC
  Administered 2017-01-23: 750 mg via INTRAVENOUS
  Filled 2017-01-22 (×2): qty 150

## 2017-01-22 MED ORDER — FERROUS SULFATE 325 (65 FE) MG PO TABS
325.0000 mg | ORAL_TABLET | Freq: Three times a day (TID) | ORAL | Status: DC
  Administered 2017-01-23: 325 mg via ORAL
  Filled 2017-01-22 (×4): qty 1

## 2017-01-22 MED ORDER — DIPHENHYDRAMINE HCL 25 MG PO CAPS: 25.0000 mg | ORAL_CAPSULE | Freq: Four times a day (QID) | ORAL | Status: DC | PRN

## 2017-01-22 MED ORDER — FENTANYL CITRATE (PF) 100 MCG/2ML IJ SOLN
INTRAMUSCULAR | Status: DC | PRN
  Administered 2017-01-22: 100 ug via INTRAVENOUS

## 2017-01-22 MED ORDER — METHOCARBAMOL 1000 MG/10ML IJ SOLN
500.0000 mg | Freq: Four times a day (QID) | INTRAVENOUS | Status: DC | PRN
  Filled 2017-01-22: qty 5

## 2017-01-22 MED ORDER — PHENYLEPHRINE 40 MCG/ML (10ML) SYRINGE FOR IV PUSH (FOR BLOOD PRESSURE SUPPORT)
PREFILLED_SYRINGE | INTRAVENOUS | Status: AC
  Filled 2017-01-22: qty 30

## 2017-01-22 MED ORDER — ONDANSETRON HCL 4 MG/2ML IJ SOLN: 4.0000 mg | Freq: Four times a day (QID) | INTRAMUSCULAR | Status: DC | PRN

## 2017-01-22 MED ORDER — METHOCARBAMOL 500 MG PO TABS
500.0000 mg | ORAL_TABLET | Freq: Four times a day (QID) | ORAL | Status: DC | PRN
  Administered 2017-01-22 – 2017-01-24 (×4): 500 mg via ORAL
  Filled 2017-01-22 (×4): qty 1

## 2017-01-22 SURGICAL SUPPLY — 51 items
ADH SKN CLS APL DERMABOND .7 (GAUZE/BANDAGES/DRESSINGS) ×1
BAG SPEC THK2 15X12 ZIP CLS (MISCELLANEOUS)
BAG ZIPLOCK 12X15 (MISCELLANEOUS) ×1 IMPLANT
BANDAGE ACE 4X5 VEL STRL LF (GAUZE/BANDAGES/DRESSINGS) IMPLANT
BANDAGE ACE 6X5 VEL STRL LF (GAUZE/BANDAGES/DRESSINGS) ×2 IMPLANT
BLADE SAW SGTL 11.0X1.19X90.0M (BLADE) IMPLANT
CLOTH BEACON ORANGE TIMEOUT ST (SAFETY) ×2 IMPLANT
COVER SURGICAL LIGHT HANDLE (MISCELLANEOUS) ×2 IMPLANT
CUFF TOURN SGL QUICK 34 (TOURNIQUET CUFF) ×2
CUFF TRNQT CYL 34X4X40X1 (TOURNIQUET CUFF) ×1 IMPLANT
DECANTER SPIKE VIAL GLASS SM (MISCELLANEOUS) ×1 IMPLANT
DERMABOND ADVANCED (GAUZE/BANDAGES/DRESSINGS) ×1
DERMABOND ADVANCED .7 DNX12 (GAUZE/BANDAGES/DRESSINGS) IMPLANT
DRAPE U-SHAPE 47X51 STRL (DRAPES) ×2 IMPLANT
DRSG AQUACEL AG ADV 3.5X10 (GAUZE/BANDAGES/DRESSINGS) ×1 IMPLANT
DRSG AQUACEL AG ADV 3.5X14 (GAUZE/BANDAGES/DRESSINGS) IMPLANT
DURAPREP 26ML APPLICATOR (WOUND CARE) ×3 IMPLANT
ELECT REM PT RETURN 15FT ADLT (MISCELLANEOUS) ×2 IMPLANT
GLOVE BIOGEL M 7.0 STRL (GLOVE) ×6 IMPLANT
GLOVE BIOGEL PI IND STRL 7.5 (GLOVE) ×1 IMPLANT
GLOVE BIOGEL PI IND STRL 8.5 (GLOVE) ×1 IMPLANT
GLOVE BIOGEL PI INDICATOR 7.5 (GLOVE) ×1
GLOVE BIOGEL PI INDICATOR 8.5 (GLOVE) ×1
GLOVE ECLIPSE 8.0 STRL XLNG CF (GLOVE) ×2 IMPLANT
GLOVE ORTHO TXT STRL SZ7.5 (GLOVE) ×2 IMPLANT
GOWN STRL REUS W/TWL LRG LVL3 (GOWN DISPOSABLE) ×6 IMPLANT
GOWN STRL REUS W/TWL XL LVL3 (GOWN DISPOSABLE) ×1 IMPLANT
HANDPIECE INTERPULSE COAX TIP (DISPOSABLE) ×2
INSERT TIBIA KNEE BRG SZ2 11MM (Insert) ×1 IMPLANT
MANIFOLD NEPTUNE II (INSTRUMENTS) ×2 IMPLANT
NDL SAFETY ECLIPSE 18X1.5 (NEEDLE) IMPLANT
NEEDLE HYPO 18GX1.5 SHARP (NEEDLE)
NS IRRIG 1000ML POUR BTL (IV SOLUTION) ×4 IMPLANT
PACK TOTAL KNEE CUSTOM (KITS) ×2 IMPLANT
POSITIONER SURGICAL ARM (MISCELLANEOUS) ×2 IMPLANT
SET HNDPC FAN SPRY TIP SCT (DISPOSABLE) ×1 IMPLANT
SPONGE LAP 18X18 X RAY DECT (DISPOSABLE) IMPLANT
STAPLER VISISTAT 35W (STAPLE) ×2 IMPLANT
SUT MNCRL AB 3-0 PS2 18 (SUTURE) ×1 IMPLANT
SUT MNCRL AB 4-0 PS2 18 (SUTURE) IMPLANT
SUT PDS AB 1 CT1 27 (SUTURE) ×4 IMPLANT
SUT VIC AB 1 CT1 36 (SUTURE) ×2 IMPLANT
SUT VIC AB 2-0 CT1 27 (SUTURE) ×6
SUT VIC AB 2-0 CT1 TAPERPNT 27 (SUTURE) ×3 IMPLANT
SUT VLOC 180 0 24IN GS25 (SUTURE) IMPLANT
SWAB COLLECTION DEVICE MRSA (MISCELLANEOUS) ×1 IMPLANT
SWAB CULTURE ESWAB REG 1ML (MISCELLANEOUS) ×1 IMPLANT
SYR 50ML LL SCALE MARK (SYRINGE) ×1 IMPLANT
TRAY FOLEY W/METER SILVER 16FR (SET/KITS/TRAYS/PACK) ×2 IMPLANT
WATER STERILE IRR 1500ML POUR (IV SOLUTION) ×2 IMPLANT
WRAP KNEE MAXI GEL POST OP (GAUZE/BANDAGES/DRESSINGS) ×2 IMPLANT

## 2017-01-22 NOTE — Anesthesia Procedure Notes (Signed)
Spinal  Patient location during procedure: OR Start time: 01/22/2017 6:39 PM End time: 01/22/2017 6:44 PM Staffing Anesthesiologist: Candida Peeling RAY Performed: anesthesiologist  Preanesthetic Checklist Completed: patient identified, site marked, surgical consent, pre-op evaluation, timeout performed, IV checked, risks and benefits discussed and monitors and equipment checked Spinal Block Patient position: sitting Prep: DuraPrep Patient monitoring: heart rate, cardiac monitor, continuous pulse ox and blood pressure Approach: right paramedian Location: L3-4 Injection technique: single-shot Needle Needle type: Quincke  Needle gauge: 22 G Needle length: 9 cm

## 2017-01-22 NOTE — Anesthesia Postprocedure Evaluation (Signed)
Anesthesia Post Note  Patient: Sheri [name]  Procedure(s) Performed: Procedure(s) (LRB): IRRIGATION AND DEBRIDEMENT KNEE WITH POLY EXCHANGE (Right)  Patient location during evaluation: PACU Anesthesia Type: General Level of consciousness: oriented and awake and alert Pain management: pain level controlled Vital Signs Assessment: post-procedure vital signs reviewed and stable Respiratory status: spontaneous breathing and respiratory function stable Cardiovascular status: blood pressure returned to baseline and stable Postop Assessment: no headache and no backache Anesthetic complications: no       Last Vitals:  Vitals:   01/22/17 2030 01/22/17 2045  BP: 122/75 129/76  Pulse: 74 73  Resp: 12 15  Temp: 36.8 C     Last Pain:  Vitals:   01/22/17 1810  TempSrc:   PainSc: 4     LLE Motor Response: Purposeful movement;Responds to commands (01/22/17 2045) LLE Sensation: Full sensation;Tingling (01/22/17 2045) RLE Motor Response: Purposeful movement;Responds to commands (01/22/17 2045) RLE Sensation: Full sensation;Tingling (01/22/17 2045) L Sensory Level: S1-Sole of foot, small toes (01/22/17 2045) R Sensory Level: S1-Sole of foot, small toes (01/22/17 2045)  Lynda Rainwater

## 2017-01-22 NOTE — Anesthesia Preprocedure Evaluation (Addendum)
Anesthesia Evaluation  Patient identified by MRN, date of birth, ID band Patient awake    Reviewed: Allergy & Precautions, NPO status , Patient's Chart, lab work & pertinent test results  Airway Mallampati: II  TM Distance: >3 FB Neck ROM: Full    Dental   Pulmonary neg pulmonary ROS,    breath sounds clear to auscultation       Cardiovascular hypertension,  Rhythm:Regular Rate:Normal     Neuro/Psych Anxiety Depression negative neurological ROS     GI/Hepatic negative GI ROS, Neg liver ROS,   Endo/Other  diabetes, Type 2Hypothyroidism   Renal/GU      Musculoskeletal   Abdominal   Peds  Hematology   Anesthesia Other Findings   Reproductive/Obstetrics                             Anesthesia Physical  Anesthesia Plan  ASA: III  Anesthesia Plan: Spinal   Post-op Pain Management:    Induction:   Airway Management Planned: Simple Face Mask  Additional Equipment:   Intra-op Plan:   Post-operative Plan:   Informed Consent: I have reviewed the patients History and Physical, chart, labs and discussed the procedure including the risks, benefits and alternatives for the proposed anesthesia with the patient or authorized representative who has indicated his/her understanding and acceptance.   Dental advisory given  Plan Discussed with: CRNA and Anesthesiologist  Anesthesia Plan Comments:        Anesthesia Quick Evaluation

## 2017-01-22 NOTE — Transfer of Care (Signed)
Immediate Anesthesia Transfer of Care Note  Patient: Sheri [name]  Procedure(s) Performed: Procedure(s): IRRIGATION AND DEBRIDEMENT KNEE WITH POLY EXCHANGE (Right)  Patient Location: PACU  Anesthesia Type:Spinal  Level of Consciousness: awake and alert   Airway & Oxygen Therapy: Patient Spontanous Breathing and Patient connected to face mask oxygen  Post-op Assessment: Report given to RN and Post -op Vital signs reviewed and stable  Post vital signs: Reviewed and stable  Last Vitals:  Vitals:   01/22/17 1716  BP: 139/80  Pulse: 77  Resp: 16  Temp: 36.9 C    Last Pain:  Vitals:   01/22/17 1810  TempSrc:   PainSc: 4       Patients Stated Pain Goal: 4 (27/61/84 8592)  Complications: No apparent anesthesia complications

## 2017-01-22 NOTE — Brief Op Note (Signed)
01/22/2017  6:42 PM  PATIENT:  Sheri [name]  66 y.o. female  PRE-OPERATIVE DIAGNOSIS:  Acute infection right total knee replacement  POST-OPERATIVE DIAGNOSIS: Acute infection right total knee replacement  PROCEDURE:  Procedure(s): IRRIGATION AND DEBRIDEMENT KNEE WITH POLY EXCHANGE (Right)  SURGEON:  Surgeon(s) and Role:    Paralee Cancel, MD - Primary  PHYSICIAN ASSISTANT: Danae Orleans, PA-C  ANESTHESIA:   spinal  EBL:  <50 cc  BLOOD ADMINISTERED:none  DRAINS: none   LOCAL MEDICATIONS USED:  NONE  SPECIMEN:  Source of Specimen:  right knee synovial fluid  DISPOSITION OF SPECIMEN:  PATHOLOGY  COUNTS:  YES  TOURNIQUET:   23 min at 250 mmHg  DICTATION: .Other Dictation: Dictation Number [id number]  PLAN OF CARE: Admit to inpatient   PATIENT DISPOSITION:  PACU - hemodynamically stable.   Delay start of Pharmacological VTE agent (>24hrs) due to surgical blood loss or risk of bleeding: no

## 2017-01-22 NOTE — Progress Notes (Signed)
Pharmacy Antibiotic Note  Sheri [name] is a 66 y.o. female admitted on 01/22/2017 for right knee arthrotomy, I&D, and poly exchange of infected right TKA (04/2012).  Pharmacy has been consulted for Vancomycin dosing.  Plan: Vancomycin 1g IV preop (given 5/24 1830) Vancomycin 750 mg IV q12h. Measure Vanc trough at steady state. Follow up renal fxn, culture results, and clinical course.  Height: 5' 4.5" (163.8 cm) Weight: 210 lb (95.3 kg) IBW/kg (Calculated) : 55.85  Temp (24hrs), Avg:98.4 F (36.9 C), Min:98.3 F (36.8 C), Max:98.4 F (36.9 C)   Recent Labs Lab 01/22/17 1737  CREATININE 1.35*    Estimated Creatinine Clearance: 45.8 mL/min (A) (by C-G formula based on SCr of 1.35 mg/dL (H)).    Allergies  Allergen Reactions  . Crestor [Rosuvastatin]     Myalgias   . Niaspan [Niacin Er]     Myalgia   . Premarin [Estrogens Conjugated] Other (See Comments)    Unknown  . Provera [Medroxyprogesterone Acetate]     "makes pt crazy"    Antimicrobials this admission: 5/24 Vancomycin >>  5/25 Rifampin >>   Dose adjustments this admission:   Microbiology results:  Thank you for allowing pharmacy to be a part of this patient's care.  [name] PharmD, BCPS Pager [phone] 01/22/2017 9:47 PM

## 2017-01-22 NOTE — H&P (Addendum)
Sheri [name] is an 66 y.o. female.    Chief Complaint:    Infected right total knee arthroplasty  Procedure: Right knee arthrotomy, I&D and poly exchange  HPI: Pt is a 66 y.o. female complaining of right knee acutely.  Pain and swelling have been consistent since it started.   Patient has a history of a right TKA by Dr. Daylene [name] in [place], [place] in 2013, Dr. Wynelle [name] did the left TKA in 2016. She denies any fever or chills, but has had persistent swelling.   Dr. Wynelle [name] aspirated the knee in the office and infection was apparent.  Dr. Wynelle [name] discussed the case with Dr. Alvan [name].   Various options are discussed with the patient. Risks, benefits and expectations were discussed with the patient. Patient understand the risks, benefits and expectations and wishes to proceed with surgery.    PCP: [name], MD  Post-op Meds:       No Rx given   PMH: Past Medical History:  Diagnosis Date  . Allergic rhinitis   . Anxiety   . Degenerative disc disease, cervical   . Depression   . Diabetes mellitus without complication ([place])    type two  . Diarrhea 04/2012   colon nl, BX NPD; no response to cholestyramin 03-2012 no response to align; transient resp to sucralfate 12-13  . H/O CT scan of abdomen 08/2012   neg; egd bile reflux gastritisand linear eros; transient resp to sucralfate   . H/O ulcer disease   . Heart murmur    hx of   . History of blood transfusion   . Hyperlipidemia   . Hyperplastic polyps of stomach   . Hypertension   . Hypothyroidism   . IBS (irritable bowel syndrome)   . Kidney stone   . Kissing, osteophytes    Dr Sheri [name]  . Menopause   . Mild anemia 06/2011   14% iron sat,ferritin 14- from Metformin - anemia resolved   . Obesity   . Osteoarthritis     PSH: Past Surgical History:  Procedure Laterality Date  . CESAREAN SECTION    . CHOLECYSTECTOMY    . COLONOSCOPY  2/06,04/20/12 repeat 10 years   dr Sheri [name]  . JOINT REPLACEMENT     right knee   . TOTAL KNEE  ARTHROPLASTY Left 02/12/2015   Procedure: LEFT TOTAL KNEE ARTHROPLASTY;  Surgeon: Sheri Arabian, MD;  Location: [facility];  Service: Orthopedics;  Laterality: Left;    Social History:  reports that she has never smoked. She has never used smokeless tobacco. She reports that she does not drink alcohol or use drugs.  Allergies:  Allergies  Allergen Reactions  . Crestor [Rosuvastatin]     Myalgias   . Niaspan [Niacin Er]     Myalgia   . Premarin [Estrogens Conjugated] Other (See Comments)    Unknown  . Provera [Medroxyprogesterone Acetate]     "makes pt crazy"    Medications: No current facility-administered medications for this encounter.      Review of Systems  Constitutional: Negative.   HENT: Negative.   Eyes: Negative.   Respiratory: Negative.   Cardiovascular: Negative.   Gastrointestinal: Negative.   Genitourinary: Negative.   Musculoskeletal: Positive for joint pain.  Skin: Negative.   Neurological: Negative.   Endo/Heme/Allergies: Positive for environmental allergies.  Psychiatric/Behavioral: The patient is nervous/anxious.        Physical Exam  Constitutional: She is oriented to person, place, and time. She appears well-developed.  HENT:  Head: Normocephalic.  Eyes: Pupils are equal, round, and reactive to light.  Neck: Neck supple. No JVD present. No tracheal deviation present. No thyromegaly present.  Cardiovascular: Normal rate, regular rhythm and intact distal pulses.   Murmur heard. Respiratory: Effort normal and breath sounds normal. No respiratory distress. She has no wheezes.  GI: Soft. There is no tenderness. There is no guarding.  Musculoskeletal:       Right knee: She exhibits decreased range of motion, swelling, effusion and laceration (healed incision). She exhibits no ecchymosis, no deformity and no erythema. Tenderness found.  Lymphadenopathy:    She has no cervical adenopathy.  Neurological: She is alert and oriented to person, place, and  time.  Skin: Skin is warm and dry.  Psychiatric: She has a normal mood and affect.       Assessment/Plan Assessment:      Infected right total knee arthroplasty   Plan: Patient will undergo a right knee arthrotomy, I&D and poly exchange on 01/22/2017 per Dr. Alvan [name] at [facility]. Risks benefits and expectations were discussed with the patient. Patient understand risks, benefits and expectations and wishes to proceed.      West Pugh Priscella Donna   PA-C  01/22/2017, 4:50 PM    Plan discussed with Aluisio and patient To OR tonight for I&D, poly exchange Post-op IV antibioitcs, PIC

## 2017-01-22 NOTE — Op Note (Signed)
NAMEJULETTA, BERHE NO.:  [id number]  MEDICAL RECORD NO.:  [id number]  LOCATION:                                 FACILITY:  PHYSICIAN:  Pietro Cassis. Alvan Dame, M.D.       DATE OF BIRTH:  DATE OF PROCEDURE:  01/22/2017 DATE OF DISCHARGE:                              OPERATIVE REPORT   PREOPERATIVE DIAGNOSIS:  Acute infection involving right total knee arthroplasty.  POSTOPERATIVE DIAGNOSIS:  Acute infection involving right total knee arthroplasty.  PROCEDURES: 1. Excisional and nonexcisional debridement of right knee. 2. Polyethylene exchange using [employer] Triathlon knee system with a     size 11-mm cruciate-retaining size 11-mm insert.  SURGEON:  Pietro Cassis. Alvan Dame, M.D.  ASSISTANT:  Danae Orleans.  Please note that Mr. Guinevere Scarlet was present for the entirety of the case from preoperative positioning, perioperative management of the operative extremity, general facilitation of the case and primary wound closure.  ANESTHESIA:  Spinal.  SPECIMENS:  Fluids from the joint were taken and sent to Pathology.  In addition, the patient had aspiration in the office earlier today, sent to laboratory.  DRAINS:  None.  COMPLICATION:  None.  INDICATIONS FOR PROCEDURE:  Ms. Piedra is a 66 year old female with history of right total knee replacement performed in [place], [place] in 2013.  She was in her normal state of health until recently, complained of some right knee pain and swelling.  She presented to the office today, see [name] and was afebrile. Based on the increased swelling and pain, he aspirated the joint, revealing pus.  She was sent to [facility] for surgical management of this.  We reviewed the indications of the procedure, the goals with a single-stage debridement, polyethylene exchange and joint curing.  Risks of recurrent infection and need for future surgery were reviewed and discussed.  Consent was obtained for benefit of  infection management.  PROCEDURE IN DETAIL:  The patient was brought to the operative theater. Once adequate anesthesia, preoperative antibiotics were held, but once incision and cultures taken, we started vancomycin empirically.  The right lower extremity was placed in the tourniquet.  The right lower extremity was then prepped and draped in sterile fashion.  Time-out was performed identifying the patient, planned procedure and extremity. Incision was made excising her old skin, incision about 8 inches long. I then created soft tissue planes.  A median arthrotomy was made encountering purulent fluid as identified through the office.  At this point, a complete synovectomy and excisional debridement of the synovium, medial, suprapatellar, lateral and then posterior.  Once was the polyethylene was carried out, we irrigated the knee at this point with 8 L of normal saline solution, pulse lavage.  Once this was done, we exchanged our gloves, opened up the final size Triathlon knee implant with a size 2 tibial baseplate and 50-PT cruciate- retaining implant to match what she had had before.  This was chosen because she had a previously identified preoperative slight flexion contracture.  The polyethylene was then impacted into place without difficulty or complication.  At this point, the extensor mechanism was reapproximated using #1 PDS sutures.  The remainder of the wound was closed with 2-0  Vicryl and running 3-0 Monocryl.  The knee was cleaned, dried and dressed sterilely using surgical glue and Aquacel dressing. She was then brought to the recovery room in stable condition.  She will be admitted to the hospital, will consult Pharmacy for vancomycin dosing, will consult Infectious Disease for perioperative antibiotics and placed her on rifampin in addition to the vancomycin now until cultures return.  She will be in the hospital until we have this information clarified  for.     Pietro Cassis Alvan Dame, M.D.     MDO/MEDQ  D:  01/22/2017  T:  01/22/2017  Job:  [id number]

## 2017-01-23 ENCOUNTER — Encounter (HOSPITAL_COMMUNITY): Payer: Self-pay | Admitting: Orthopedic Surgery

## 2017-01-23 DIAGNOSIS — Z888 Allergy status to other drugs, medicaments and biological substances status: Secondary | ICD-10-CM

## 2017-01-23 DIAGNOSIS — T8453XD Infection and inflammatory reaction due to internal right knee prosthesis, subsequent encounter: Secondary | ICD-10-CM

## 2017-01-23 DIAGNOSIS — Z9049 Acquired absence of other specified parts of digestive tract: Secondary | ICD-10-CM

## 2017-01-23 DIAGNOSIS — Y792 Prosthetic and other implants, materials and accessory orthopedic devices associated with adverse incidents: Secondary | ICD-10-CM

## 2017-01-23 DIAGNOSIS — B9689 Other specified bacterial agents as the cause of diseases classified elsewhere: Secondary | ICD-10-CM

## 2017-01-23 DIAGNOSIS — Z96652 Presence of left artificial knee joint: Secondary | ICD-10-CM

## 2017-01-23 DIAGNOSIS — M00861 Arthritis due to other bacteria, right knee: Secondary | ICD-10-CM

## 2017-01-23 DIAGNOSIS — R6883 Chills (without fever): Secondary | ICD-10-CM

## 2017-01-23 LAB — CBC
HCT: 33 % — ABNORMAL LOW (ref 36.0–46.0)
Hemoglobin: 10.7 g/dL — ABNORMAL LOW (ref 12.0–15.0)
MCH: 29 pg (ref 26.0–34.0)
MCHC: 32.4 g/dL (ref 30.0–36.0)
MCV: 89.4 fL (ref 78.0–100.0)
Platelets: 271 10*3/uL (ref 150–400)
RBC: 3.69 MIL/uL — ABNORMAL LOW (ref 3.87–5.11)
RDW: 14.3 % (ref 11.5–15.5)
WBC: 14.1 10*3/uL — ABNORMAL HIGH (ref 4.0–10.5)

## 2017-01-23 LAB — BASIC METABOLIC PANEL WITH GFR
Anion gap: 9 (ref 5–15)
BUN: 18 mg/dL (ref 6–20)
CO2: 25 mmol/L (ref 22–32)
Calcium: 9.3 mg/dL (ref 8.9–10.3)
Chloride: 102 mmol/L (ref 101–111)
Creatinine, Ser: 0.73 mg/dL (ref 0.44–1.00)
GFR calc Af Amer: >60 mL/min
GFR calc non Af Amer: >60 mL/min
Glucose, Bld: 135 mg/dL — ABNORMAL HIGH (ref 65–99)
Potassium: 4.4 mmol/L (ref 3.5–5.1)
Sodium: 136 mmol/L (ref 135–145)

## 2017-01-23 LAB — HEMOGLOBIN A1C
Hgb A1c MFr Bld: 6.1 % — ABNORMAL HIGH (ref 4.8–5.6)
Mean Plasma Glucose: 128 mg/dL

## 2017-01-23 MED ORDER — ACETAMINOPHEN 500 MG PO TABS
1000.0000 mg | ORAL_TABLET | Freq: Three times a day (TID) | ORAL | Status: DC
  Administered 2017-01-23 – 2017-01-25 (×5): 1000 mg via ORAL
  Filled 2017-01-23 (×5): qty 2

## 2017-01-23 MED ORDER — VANCOMYCIN HCL 10 G IV SOLR
1250.0000 mg | Freq: Two times a day (BID) | INTRAVENOUS | Status: DC
  Administered 2017-01-23: 1250 mg via INTRAVENOUS
  Filled 2017-01-23 (×2): qty 1250

## 2017-01-23 MED ORDER — OXYCODONE HCL 5 MG PO TABS
5.0000 mg | ORAL_TABLET | ORAL | Status: DC
  Administered 2017-01-23 – 2017-01-25 (×13): 15 mg via ORAL
  Filled 2017-01-23 (×13): qty 3

## 2017-01-23 MED ORDER — SACCHAROMYCES BOULARDII 250 MG PO CAPS
250.0000 mg | ORAL_CAPSULE | Freq: Two times a day (BID) | ORAL | Status: DC
  Administered 2017-01-23 – 2017-01-25 (×4): 250 mg via ORAL
  Filled 2017-01-23 (×4): qty 1

## 2017-01-23 NOTE — Progress Notes (Signed)
[facility] Infusion Coordinator will follow pt with ID team and support based on ID recommendations with Home IV ABX if ordered.   If patient discharges after hours, please call [phone].   Larry Sierras 01/23/2017, 2:37 PM

## 2017-01-23 NOTE — Evaluation (Signed)
Occupational Therapy Evaluation Patient Details Name: Sheri [name] MRN: [id number] DOB: 12-04-1950 Today's Date: 01/23/2017    History of Present Illness Pt s/p I&D of R TKR with liner replacement.  Pt with hx of DM and bilat TKR   Clinical Impression   This 66 year old female was admitted for the above sx. All education was completed. Pt has DME from previous sx and feels comfortable with ADLs, toilet transfers, and tub transfers with family's assistance. No further OT needs at this time    Follow Up Recommendations  Supervision/Assistance - 24 hour    Equipment Recommendations  None recommended by OT    Recommendations for Other Services       Precautions / Restrictions Precautions Precautions: Knee;Fall Restrictions Weight Bearing Restrictions: No Other Position/Activity Restrictions: WBAT      Mobility Bed Mobility Overal bed mobility: Needs Assistance Bed Mobility: Supine to Sit     Supine to sit: Min assist;+2 for safety/equipment;HOB elevated     General bed mobility comments: cues for sequence with assist to manage R LE and pt rolling to exit bed 2* back issues  Transfers Overall transfer level: Needs assistance Equipment used: Rolling walker (2 wheeled) Transfers: Sit to/from Stand Sit to Stand: Min assist;+2 safety/equipment         General transfer comment: cues for LE management and use of UEs to self assist    Balance                                           ADL either performed or assessed with clinical judgement   ADL Overall ADL's : Needs assistance/impaired Eating/Feeding: Independent   Grooming: Oral care;Min guard;Standing;Wash/dry hands Grooming Details (indicate cue type and reason): and putting contacts in Upper Body Bathing: Set up;Sitting   Lower Body Bathing: Minimal assistance;Sit to/from stand;With adaptive equipment;+2 for safety/equipment   Upper Body Dressing : Set up;Sitting   Lower Body  Dressing: Moderate assistance;Sit to/from stand;With adaptive equipment;+2 for safety/equipment   Toilet Transfer: Minimal assistance;RW;BSC;Ambulation;+2 for safety/equipment   Toileting- Clothing Manipulation and Hygiene: Minimal assistance;Sit to/from stand;+2 for safety/equipment         General ADL Comments: stood at sink twice, performing grooming tasks.  She performed toilet transfer and has 3:1 and comfort height commode. She also has a tub bench at home and feels comfortable using this     Vision         Perception     Praxis      Pertinent Vitals/Pain Pain Assessment: 0-10 Pain Score: 6  Pain Location: R knee Pain Descriptors / Indicators: Aching;Sore Pain Intervention(s): Limited activity within patient's tolerance;Monitored during session;Premedicated before session;Ice applied     Hand Dominance     Extremity/Trunk Assessment Upper Extremity Assessment Upper Extremity Assessment: Overall WFL for tasks assessed      Cervical / Trunk Assessment Cervical / Trunk Assessment: Normal   Communication Communication Communication: No difficulties   Cognition Arousal/Alertness: Awake/alert Behavior During Therapy: WFL for tasks assessed/performed Overall Cognitive Status: Within Functional Limits for tasks assessed                                     General Comments       Exercises    Shoulder Instructions      Home Living  Family/patient expects to be discharged to:: Private residence Living Arrangements: Spouse/significant other Available Help at Discharge: Family Type of Home: House Home Access: Stairs to enter [employer] of Steps: 1 small step   Home Layout: Two level;Able to live on main level with bedroom/bathroom     Bathroom Shower/Tub: Tub/shower unit   Bathroom Toilet: Handicapped height Bathroom Accessibility: Yes   Home Equipment: Environmental consultant - 2 wheels;Walker - 4 wheels;Cane - single point;Bedside commode;Tub  bench          Prior Functioning/Environment Level of Independence: Independent                 OT Problem List:        OT Treatment/Interventions:      OT Goals(Current goals can be found in the care plan section) Acute Rehab OT Goals Patient Stated Goal: Regain IND OT Goal Formulation: All assessment and education complete, DC therapy  OT Frequency:     Barriers to D/C:            Co-evaluation   Reason for Co-Treatment: For patient/therapist safety PT goals addressed during session: Mobility/safety with mobility OT goals addressed during session: ADL's and self-care      AM-PAC PT "6 Clicks" Daily Activity     Outcome Measure Help from another person eating meals?: None Help from another person taking care of personal grooming?: A Little Help from another person toileting, which includes using toliet, bedpan, or urinal?: A Little Help from another person bathing (including washing, rinsing, drying)?: A Little Help from another person to put on and taking off regular upper body clothing?: A Little Help from another person to put on and taking off regular lower body clothing?: A Lot 6 Click Score: 18   End of Session    Activity Tolerance: Patient tolerated treatment well Patient left: in chair;with call bell/phone within reach  OT Visit Diagnosis: Pain Pain - Right/Left: Right Pain - part of body: Knee                Time: 3893-7342 OT Time Calculation (min): 32 min Charges:  OT General Charges $OT Visit: 1 Procedure OT Evaluation $OT Eval Low Complexity: 1 Procedure G-Codes:     [place], OTR/L [phone] 01/23/2017  Sheri [name] 01/23/2017, 1:50 PM

## 2017-01-23 NOTE — Progress Notes (Signed)
Physical Therapy Treatment Patient Details Name: Sheri [name] MRN: [id number] DOB: September 13, 1950 Today's Date: 01/23/2017    History of Present Illness Pt s/p I&D of R TKR with liner replacement.  Pt with hx of DM and bilat TKR    PT Comments    Pt cooperative but ltd this pm by fatigue.   Follow Up Recommendations  Home health PT     Equipment Recommendations  None recommended by PT    Recommendations for Other Services OT consult     Precautions / Restrictions Precautions Precautions: Knee;Fall Restrictions Weight Bearing Restrictions: No Other Position/Activity Restrictions: WBAT    Mobility  Bed Mobility Overal bed mobility: Needs Assistance Bed Mobility: Supine to Sit     Supine to sit: Min assist;+2 for safety/equipment;HOB elevated     General bed mobility comments: cues for sequence with assist to manage R LE and pt rolling to exit bed 2* back issues  Transfers Overall transfer level: Needs assistance Equipment used: Rolling walker (2 wheeled) Transfers: Sit to/from Stand Sit to Stand: Min assist;Mod assist         General transfer comment: cues for LE management and use of UEs to self assist  Ambulation/Gait Ambulation/Gait assistance: Min assist Ambulation Distance (Feet): 18 Feet (twice) Assistive device: Rolling walker (2 wheeled) Gait Pattern/deviations: Step-to pattern;Step-through pattern;Decreased step length - right;Decreased step length - left;Shuffle;Trunk flexed Gait velocity: decr Gait velocity interpretation: Below normal speed for age/gender General Gait Details: min cues for posture, position from RW and initial sequence   Stairs            Wheelchair Mobility    Modified Rankin (Stroke Patients Only)       Balance Overall balance assessment: No apparent balance deficits (not formally assessed)                                          Cognition Arousal/Alertness: Awake/alert Behavior During  Therapy: WFL for tasks assessed/performed Overall Cognitive Status: Within Functional Limits for tasks assessed                                        Exercises Total Joint Exercises Ankle Circles/Pumps: AROM;Both;15 reps;Supine    General Comments        Pertinent Vitals/Pain Pain Assessment: 0-10 Pain Score: 5  Pain Location: R knee Pain Descriptors / Indicators: Aching;Sore Pain Intervention(s): Limited activity within patient's tolerance;Monitored during session;Premedicated before session;Ice applied    Home Living Family/patient expects to be discharged to:: Private residence Living Arrangements: Spouse/significant other Available Help at Discharge: Family Type of Home: House Home Access: Stairs to enter   [place]: Two level;Able to live on main level with bedroom/bathroom Home Equipment: Gilford Rile - 2 wheels;Walker - 4 wheels;Cane - single point;Bedside commode;Tub bench      Prior Function Level of Independence: Independent          PT Goals (current goals can now be found in the care plan section) Acute Rehab PT Goals Patient Stated Goal: Regain IND PT Goal Formulation: With patient Time For Goal Achievement: 01/30/17 Potential to Achieve Goals: Good Progress towards PT goals: Progressing toward goals    Frequency    7X/week      PT Plan Current plan remains appropriate    Co-evaluation PT/OT/SLP Co-Evaluation/Treatment: Yes  Reason for Co-Treatment: For patient/therapist safety PT goals addressed during session: Mobility/safety with mobility OT goals addressed during session: ADL's and self-care      AM-PAC PT "6 Clicks" Daily Activity  Outcome Measure  Difficulty turning over in bed (including adjusting bedclothes, sheets and blankets)?: A Little Difficulty moving from lying on back to sitting on the side of the bed? : A Little Difficulty sitting down on and standing up from a chair with arms (e.g., wheelchair, bedside  commode, etc,.)?: A Little Help needed moving to and from a bed to chair (including a wheelchair)?: A Little Help needed walking in hospital room?: A Little Help needed climbing 3-5 steps with a railing? : A Lot 6 Click Score: 17    End of Session Equipment Utilized During Treatment: Gait belt Activity Tolerance: Patient tolerated treatment well;Patient limited by pain Patient left: in bed Nurse Communication: Mobility status PT Visit Diagnosis: Difficulty in walking, not elsewhere classified (R26.2)     Time: 6190-1222 PT Time Calculation (min) (ACUTE ONLY): 10 min  Charges:  $Gait Training: 8-22 mins                    G Codes:       Pg [phone]    Michelle Vanhise 01/23/2017, 3:47 PM

## 2017-01-23 NOTE — Evaluation (Signed)
Physical Therapy Evaluation Patient Details Name: Sheri [name] MRN: [id number] DOB: [birth year] Today's Date: 01/23/2017   History of Present Illness  Pt s/p I&D of R TKR with liner replacement.  Pt with hx of DM and bilat TKR  Clinical Impression  Pt admitted as above and presenting with functional mobility limitations 2* decreased R LE strength/ROM and post op pain limiting functional mobility.  Pt should progress to dc home with family assist.    Follow Up Recommendations Home health PT    Equipment Recommendations  None recommended by PT    Recommendations for Other Services OT consult     Precautions / Restrictions Precautions Precautions: Knee;Fall Restrictions Weight Bearing Restrictions: No Other Position/Activity Restrictions: WBAT      Mobility  Bed Mobility Overal bed mobility: Needs Assistance Bed Mobility: Supine to Sit     Supine to sit: Min assist;+2 for safety/equipment;HOB elevated     General bed mobility comments: cues for sequence with assist to manage R LE and pt rolling to exit bed 2* back issues  Transfers Overall transfer level: Needs assistance Equipment used: Rolling walker (2 wheeled) Transfers: Sit to/from Stand Sit to Stand: Min assist;+2 safety/equipment         General transfer comment: cues for LE management and use of UEs to self assist  Ambulation/Gait Ambulation/Gait assistance: Min assist;+2 safety/equipment Ambulation Distance (Feet): 25 Feet Assistive device: Rolling walker (2 wheeled) Gait Pattern/deviations: Step-to pattern;Step-through pattern;Decreased step length - right;Decreased step length - left;Shuffle;Trunk flexed Gait velocity: decr Gait velocity interpretation: Below normal speed for age/gender General Gait Details: cues for posture, position from RW and initial sequence  Stairs            Wheelchair Mobility    Modified Rankin (Stroke Patients Only)       Balance                                              Pertinent Vitals/Pain Pain Assessment: 0-10 Pain Score: 6  Pain Location: R knee Pain Descriptors / Indicators: Aching;Sore Pain Intervention(s): Limited activity within patient's tolerance;Monitored during session;Premedicated before session;Ice applied    Home Living Family/patient expects to be discharged to:: Private residence Living Arrangements: Spouse/significant other Available Help at Discharge: Family Type of Home: House Home Access: Stairs to enter   [employer] of Steps: 1 small step Home Layout: Two level;Able to live on main level with bedroom/bathroom Home Equipment: Gilford Rile - 2 wheels;Walker - 4 wheels;Cane - single point;Bedside commode;Tub bench      Prior Function Level of Independence: Independent               Hand Dominance        Extremity/Trunk Assessment   Upper Extremity Assessment Upper Extremity Assessment: Defer to OT evaluation    Lower Extremity Assessment Lower Extremity Assessment: RLE deficits/detail    Cervical / Trunk Assessment Cervical / Trunk Assessment: Normal  Communication   Communication: No difficulties  Cognition Arousal/Alertness: Awake/alert Behavior During Therapy: WFL for tasks assessed/performed Overall Cognitive Status: Within Functional Limits for tasks assessed                                        General Comments      Exercises Total Joint Exercises Ankle  Circles/Pumps: AROM;Both;15 reps;Supine   Assessment/Plan    PT Assessment Patient needs continued PT services  PT Problem List Decreased strength;Decreased range of motion;Decreased activity tolerance;Decreased mobility;Decreased knowledge of use of DME;Pain       PT Treatment Interventions DME instruction;Gait training;Stair training;Functional mobility training;Therapeutic activities;Therapeutic exercise;Patient/family education    PT Goals (Current goals can be found in the  Care Plan section)  Acute Rehab PT Goals Patient Stated Goal: Regain IND PT Goal Formulation: With patient Time For Goal Achievement: 01/30/17 Potential to Achieve Goals: Good    Frequency 7X/week   Barriers to discharge        Co-evaluation PT/OT/SLP Co-Evaluation/Treatment: Yes Reason for Co-Treatment: For patient/therapist safety PT goals addressed during session: Mobility/safety with mobility OT goals addressed during session: ADL's and self-care       AM-PAC PT "6 Clicks" Daily Activity  Outcome Measure Difficulty turning over in bed (including adjusting bedclothes, sheets and blankets)?: A Little Difficulty moving from lying on back to sitting on the side of the bed? : A Little Difficulty sitting down on and standing up from a chair with arms (e.g., wheelchair, bedside commode, etc,.)?: A Little Help needed moving to and from a bed to chair (including a wheelchair)?: A Little Help needed walking in hospital room?: A Little Help needed climbing 3-5 steps with a railing? : A Lot 6 Click Score: 17    End of Session Equipment Utilized During Treatment: Gait belt Activity Tolerance: Patient tolerated treatment well;Patient limited by pain Patient left: in chair;with call bell/phone within reach;with family/visitor present Nurse Communication: Mobility status PT Visit Diagnosis: Difficulty in walking, not elsewhere classified (R26.2)    Time: 0981-1914 PT Time Calculation (min) (ACUTE ONLY): 45 min   Charges:   PT Evaluation $PT Eval Low Complexity: 1 Procedure PT Treatments $Gait Training: 8-22 mins   PT G Codes:        Pg [phone]   Tationa Stech 01/23/2017, 1:46 PM

## 2017-01-23 NOTE — Progress Notes (Signed)
Pharmacy Antibiotic Note  Sheri [name] is a 66 y.o. female admitted on 01/22/2017 for right knee arthrotomy, I&D, and poly exchange of infected right TKA (04/2012).  Pharmacy has been consulted for Vancomycin dosing.  Plan:  SCr has improved from 1.35 to 0.73. Therefore will increase dose to vancomycin 1250 mg IV q12h  Measure Vanc trough at steady state.  Follow up renal fxn, culture results, and clinical course.  Height: 5' 4.5" (163.8 cm) Weight: 210 lb (95.3 kg) IBW/kg (Calculated) : 55.85  Temp (24hrs), Avg:98.4 F (36.9 C), Min:98.1 F (36.7 C), Max:98.9 F (37.2 C)   Recent Labs Lab 01/22/17 1737 01/23/17 0420  WBC  --  14.1*  CREATININE 1.35* 0.73    Estimated Creatinine Clearance: 77.2 mL/min (by C-G formula based on SCr of 0.73 mg/dL).    Allergies  Allergen Reactions  . Crestor [Rosuvastatin]     Myalgias   . Niaspan [Niacin Er]     Myalgia   . Premarin [Estrogens Conjugated] Other (See Comments)    Unknown  . Provera [Medroxyprogesterone Acetate]     "makes pt crazy"    Antimicrobials this admission: 5/24 Vancomycin >>  5/25 Rifampin >>   Dose adjustments this admission: 5/25: Increase vancomycin to 1250 mg IV q12 from 750 mg IV q12h due to improved CrCl.    Microbiology results:  5/24: knee culture: IP  Thank you for allowing pharmacy to be a part of this patient's care.   Royetta Asal, PharmD, BCPS Pager [phone] 01/23/2017 1:39 PM

## 2017-01-23 NOTE — Consult Note (Signed)
Date of Admission:  01/22/2017  Date of Consult:  01/23/2017  Reason for Consult: Infected TKA  Referring Physician: [name]   HPI: Sheri [name] is an 66 y.o. female with hx of OA, TKA, R, then on left. This week she developed sudden onset of chills, malaise and knee pain with swelling. She had noted any trauma or reason for the knee to have begun hurting or swelling. She measured her temp which was not high but her chills persisted (she is even feeling them now). Pain worsened and she was seen in Dr. Peri Maris office where an aspirate was taken and sent for culture ([facility]?) She was taken to the OR by [name] and underwent I and D with exchange of poly-liner.   GS shows GP pairs. She has been placed on IV vancomycin and also rifampin.   Past Medical History:  Diagnosis Date  . Allergic rhinitis   . Anxiety   . Degenerative disc disease, cervical   . Depression   . Diabetes mellitus without complication ([place])    type two  . Diarrhea 04/2012   colon nl, BX NPD; no response to cholestyramin 03-2012 no response to align; transient resp to sucralfate 12-13  . H/O CT scan of abdomen 08/2012   neg; egd bile reflux gastritisand linear eros; transient resp to sucralfate   . H/O ulcer disease   . Heart murmur    hx of   . History of blood transfusion   . Hyperlipidemia   . Hyperplastic polyps of stomach   . Hypertension   . Hypothyroidism   . IBS (irritable bowel syndrome)   . Kidney stone   . Kissing, osteophytes    Dr Carloyn Manner  . Menopause   . Mild anemia 06/2011   14% iron sat,ferritin 14- from Metformin - anemia resolved   . Obesity   . Osteoarthritis     Past Surgical History:  Procedure Laterality Date  . CESAREAN SECTION    . CHOLECYSTECTOMY    . COLONOSCOPY  2/06,04/20/12 repeat 10 years   dr Cristina Gong  . I&D KNEE WITH POLY EXCHANGE Right 01/22/2017   Procedure: IRRIGATION AND DEBRIDEMENT KNEE WITH POLY EXCHANGE;  Surgeon: Paralee Cancel, MD;  Location: [facility];  Service: Orthopedics;  Laterality: Right;  . JOINT REPLACEMENT     right knee   . TOTAL KNEE ARTHROPLASTY Left 02/12/2015   Procedure: LEFT TOTAL KNEE ARTHROPLASTY;  Surgeon: Gaynelle Arabian, MD;  Location: [facility];  Service: Orthopedics;  Laterality: Left;    Social History:  reports that she has never smoked. She has never used smokeless tobacco. She reports that she does not drink alcohol or use drugs.   History reviewed. No pertinent family history.  Allergies  Allergen Reactions  . Crestor [Rosuvastatin]     Myalgias   . Niaspan [Niacin Er]     Myalgia   . Premarin [Estrogens Conjugated] Other (See Comments)    Unknown  . Provera [Medroxyprogesterone Acetate]     "makes pt crazy"     Medications: I have reviewed patients current medications as documented in Epic Anti-infectives    Start     Dose/Rate Route Frequency Ordered Stop   01/23/17 1800  vancomycin (VANCOCIN) 1,250 mg in sodium chloride 0.9 % 250 mL IVPB     1,250 mg 166.7 mL/hr over 90 Minutes Intravenous Every 12 hours 01/23/17 1341     01/23/17 0600  vancomycin (VANCOCIN) IVPB 750  mg/150 ml premix  Status:  Discontinued     750 mg 150 mL/hr over 60 Minutes Intravenous Every 12 hours 01/22/17 2150 01/23/17 1341   01/23/17 0000  rifampin (RIFADIN) capsule 300 mg     300 mg Oral Every 12 hours 01/22/17 2139     01/22/17 1826  vancomycin (VANCOCIN) 1-5 GM/200ML-% IVPB    Comments:  Octavio Graves   : cabinet override      01/22/17 1826 01/23/17 0629         ROS:  as in HPI otherwise remainder of 12 point Review of Systems is negative   Blood pressure 140/70, pulse 62, temperature 98.3 F (36.8 C), temperature source Oral, resp. rate 16, height 5' 4.5" (1.638 m), weight 210 lb (95.3 kg), SpO2 99 %. General: Alert and awake, oriented x3, not in any acute distress. HEENT: anicteric sclera,  EOMI,  Cardiovascular: regular rate, normal r,  no murmur rubs or  gallops Pulmonary:  no wheezing, resp distress Gastrointestinal: soft  nondistended, normal bowel sounds, Musculoskeletal: knee dressed  Skin, soft tissue: no rashes Neuro: nonfocal, strength and sensation intact   Results for orders placed or performed during the hospital encounter of 01/22/17 (from the past 48 hour(s))  Glucose, capillary     Status: Abnormal   Collection Time: 01/22/17  5:29 PM  Result Value Ref Range   Glucose-Capillary 128 (H) 65 - 99 mg/dL  Hemoglobin A1c     Status: Abnormal   Collection Time: 01/22/17  5:35 PM  Result Value Ref Range   Hgb A1c MFr Bld 6.1 (H) 4.8 - 5.6 %    Comment: (NOTE)         Pre-diabetes: 5.7 - 6.4         Diabetes: >6.4         Glycemic control for adults with diabetes: <7.0    Mean Plasma Glucose 128 mg/dL    Comment: (NOTE) Performed At: [facility] [place], [place] [id number] Lindon Romp MD EZ:[id number]   Surgical pcr screen     Status: None   Collection Time: 01/22/17  5:35 PM  Result Value Ref Range   MRSA, PCR NEGATIVE NEGATIVE   Staphylococcus aureus NEGATIVE NEGATIVE    Comment:        The Xpert SA Assay (FDA approved for NASAL specimens in patients over 77 years of age), is one component of a comprehensive surveillance program.  Test performance has been validated by [facility] for patients greater than or equal to 71 year old. It is not intended to diagnose infection nor to guide or monitor treatment.   Basic metabolic panel     Status: Abnormal   Collection Time: 01/22/17  5:37 PM  Result Value Ref Range   Sodium 136 135 - 145 mmol/L   Potassium 4.4 3.5 - 5.1 mmol/L   Chloride 100 (L) 101 - 111 mmol/L   CO2 23 22 - 32 mmol/L   Glucose, Bld 140 (H) 65 - 99 mg/dL   BUN 29 (H) 6 - 20 mg/dL   Creatinine, Ser 1.35 (H) 0.44 - 1.00 mg/dL   Calcium 10.4 (H) 8.9 - 10.3 mg/dL   GFR calc non Af Amer 40 (L) >60 mL/min   GFR calc Af Amer 46 (L) >60 mL/min    Comment: (NOTE) The  eGFR has been calculated using the CKD EPI equation. This calculation has not been validated in all clinical situations. eGFR's persistently <60 mL/min signify possible Chronic Kidney  Disease.    Anion gap 13 5 - 15  Hemoglobin     Status: None   Collection Time: 01/22/17  5:37 PM  Result Value Ref Range   Hemoglobin 12.6 12.0 - 15.0 g/dL  Type and screen Order type and screen if day of surgery is less than 15 days from draw of preadmission visit or order morning of surgery if day of surgery is greater than 6 days from preadmission visit.     Status: None   Collection Time: 01/22/17  6:01 PM  Result Value Ref Range   ABO/RH(D) A POS    Antibody Screen NEG    Sample Expiration 01/25/2017   Aerobic/Anaerobic Culture (surgical/deep wound)     Status: None (Preliminary result)   Collection Time: 01/22/17  7:06 PM  Result Value Ref Range   Specimen Description SYNOVIAL RIGHT KNEE    Special Requests NONE    Gram Stain      FEW WBC PRESENT,BOTH PMN AND MONONUCLEAR RARE GRAM POSITIVE COCCI IN PAIRS Performed at [facility], [place] [address], [place], [place] [address]    Culture PENDING    Report Status PENDING   CBC     Status: Abnormal   Collection Time: 01/23/17  4:20 AM  Result Value Ref Range   WBC 14.1 (H) 4.0 - 10.5 K/uL   RBC 3.69 (L) 3.87 - 5.11 MIL/uL   Hemoglobin 10.7 (L) 12.0 - 15.0 g/dL   HCT 33.0 (L) 36.0 - 46.0 %   MCV 89.4 78.0 - 100.0 fL   MCH 29.0 26.0 - 34.0 pg   MCHC 32.4 30.0 - 36.0 g/dL   RDW 14.3 11.5 - 15.5 %   Platelets 271 150 - 400 K/uL  Basic metabolic panel     Status: Abnormal   Collection Time: 01/23/17  4:20 AM  Result Value Ref Range   Sodium 136 135 - 145 mmol/L   Potassium 4.4 3.5 - 5.1 mmol/L   Chloride 102 101 - 111 mmol/L   CO2 25 22 - 32 mmol/L   Glucose, Bld 135 (H) 65 - 99 mg/dL   BUN 18 6 - 20 mg/dL   Creatinine, Ser 0.73 0.44 - 1.00 mg/dL   Calcium 9.3 8.9 - 10.3 mg/dL   GFR calc non Af Amer >60 >60 mL/min   GFR calc Af Amer  >60 >60 mL/min    Comment: (NOTE) The eGFR has been calculated using the CKD EPI equation. This calculation has not been validated in all clinical situations. eGFR's persistently <60 mL/min signify possible Chronic Kidney Disease.    Anion gap 9 5 - 15   _0 (sdes,specrequest,cult,reptstatus)   ) Recent Results (from the past 720 hour(s))  Surgical pcr screen     Status: None   Collection Time: 01/22/17  5:35 PM  Result Value Ref Range Status   MRSA, PCR NEGATIVE NEGATIVE Final   Staphylococcus aureus NEGATIVE NEGATIVE Final    Comment:        The Xpert SA Assay (FDA approved for NASAL specimens in patients over 79 years of age), is one component of a comprehensive surveillance program.  Test performance has been validated by [facility] for patients greater than or equal to 47 year old. It is not intended to diagnose infection nor to guide or monitor treatment.   Aerobic/Anaerobic Culture (surgical/deep wound)     Status: None (Preliminary result)   Collection Time: 01/22/17  7:06 PM  Result Value Ref Range Status   Specimen  Description SYNOVIAL RIGHT KNEE  Final   Special Requests NONE  Final   Gram Stain   Final    FEW WBC PRESENT,BOTH PMN AND MONONUCLEAR RARE GRAM POSITIVE COCCI IN PAIRS Performed at [facility], [address], [place], [place] [address]    Culture PENDING  Incomplete   Report Status PENDING  Incomplete     Impression/Recommendation  Active Problems:   Infection of total right knee replacement (HCC)   LIAM BOSSMAN is a 66 y.o. female with  OA and bilateral TKA now with infection  Of right side sp I and D and exchange in single staged procedure  --obtain  Culture data from office --followup that culture and operative one --I am hoping this is a streptococcal infection, and in particular hoping we dont have to use vancomycin but can use a cephalosporin. She is going to need 6 weeks of parenteral therapy followed by  minimum of 6 months oral therapy  I will only continue rifampin if this is an MSSA, MRSA or COag neg staph  Screening: screen for HIV, and viral hepatides   01/23/2017, 5:42 PM   Thank you so much for this interesting consult  [place] for [place] [phone] (pager) [phone] (office) 01/23/2017, 5:42 PM  [place] 01/23/2017, 5:42 PM

## 2017-01-24 DIAGNOSIS — M00261 Other streptococcal arthritis, right knee: Secondary | ICD-10-CM

## 2017-01-24 DIAGNOSIS — A491 Streptococcal infection, unspecified site: Secondary | ICD-10-CM

## 2017-01-24 DIAGNOSIS — B951 Streptococcus, group B, as the cause of diseases classified elsewhere: Secondary | ICD-10-CM

## 2017-01-24 LAB — COMPREHENSIVE METABOLIC PANEL
ALT: 36 U/L (ref 14–54)
AST: 25 U/L (ref 15–41)
Albumin: 2.7 g/dL — ABNORMAL LOW (ref 3.5–5.0)
Alkaline Phosphatase: 115 U/L (ref 38–126)
Anion gap: 8 (ref 5–15)
BUN: 14 mg/dL (ref 6–20)
CO2: 27 mmol/L (ref 22–32)
Calcium: 9.6 mg/dL (ref 8.9–10.3)
Chloride: 103 mmol/L (ref 101–111)
Creatinine, Ser: 0.84 mg/dL (ref 0.44–1.00)
GFR calc Af Amer: >60 mL/min (ref >60)
GFR calc non Af Amer: >60 mL/min (ref >60)
Glucose, Bld: 209 mg/dL — ABNORMAL HIGH (ref 65–99)
Potassium: 4.4 mmol/L (ref 3.5–5.1)
Sodium: 138 mmol/L (ref 135–145)
Total Bilirubin: 2.4 mg/dL — ABNORMAL HIGH (ref 0.3–1.2)
Total Protein: 6.5 g/dL (ref 6.5–8.1)

## 2017-01-24 LAB — SEDIMENTATION RATE: Sed Rate: 120 mm/hr — ABNORMAL HIGH (ref 0–22)

## 2017-01-24 LAB — URINALYSIS, ROUTINE W REFLEX MICROSCOPIC

## 2017-01-24 LAB — CBC WITH DIFFERENTIAL/PLATELET
Basophils Absolute: 0 10*3/uL (ref 0.0–0.1)
Basophils Relative: 0 %
Eosinophils Absolute: 0 10*3/uL (ref 0.0–0.7)
Eosinophils Relative: 0 %
HCT: 32 % — ABNORMAL LOW (ref 36.0–46.0)
Hemoglobin: 10.6 g/dL — ABNORMAL LOW (ref 12.0–15.0)
Lymphocytes Relative: 9 %
Lymphs Abs: 1.2 10*3/uL (ref 0.7–4.0)
MCH: 29.3 pg (ref 26.0–34.0)
MCHC: 33.1 g/dL (ref 30.0–36.0)
MCV: 88.4 fL (ref 78.0–100.0)
Monocytes Absolute: 1.2 10*3/uL — ABNORMAL HIGH (ref 0.1–1.0)
Monocytes Relative: 9 %
Neutro Abs: 10.6 10*3/uL — ABNORMAL HIGH (ref 1.7–7.7)
Neutrophils Relative %: 82 %
Platelets: 381 10*3/uL (ref 150–400)
RBC: 3.62 MIL/uL — ABNORMAL LOW (ref 3.87–5.11)
RDW: 14 % (ref 11.5–15.5)
WBC: 13 10*3/uL — ABNORMAL HIGH (ref 4.0–10.5)

## 2017-01-24 LAB — C-REACTIVE PROTEIN: CRP: 29.3 mg/dL — ABNORMAL HIGH (ref <1.0)

## 2017-01-24 MED ORDER — CEFTRIAXONE SODIUM 2 G IJ SOLR
2.0000 g | INTRAMUSCULAR | Status: DC
  Administered 2017-01-24 – 2017-01-25 (×2): 2 g via INTRAVENOUS
  Filled 2017-01-24 (×2): qty 2

## 2017-01-24 MED ORDER — SODIUM CHLORIDE 0.9% FLUSH
10.0000 mL | INTRAVENOUS | Status: DC | PRN
  Administered 2017-01-25: 10 mL
  Filled 2017-01-24: qty 40

## 2017-01-24 NOTE — Progress Notes (Signed)
Peripherally Inserted Central Catheter/Midline Placement  The IV Nurse has discussed with the patient and/or persons authorized to consent for the patient, the purpose of this procedure and the potential benefits and risks involved with this procedure.  The benefits include less needle sticks, lab draws from the catheter, and the patient may be discharged home with the catheter. Risks include, but not limited to, infection, bleeding, blood clot (thrombus formation), and puncture of an artery; nerve damage and irregular heartbeat and possibility to perform a PICC exchange if needed/ordered by physician.  Alternatives to this procedure were also discussed.  [employer] PICC patient education guide, fact sheet on infection prevention and patient information card has been provided to patient /or left at bedside.    PICC/Midline Placement Documentation        Sheri [name], Nicolette Bang 01/24/2017, 1:24 PM

## 2017-01-24 NOTE — Progress Notes (Signed)
PHARMACY CONSULT NOTE FOR:  OUTPATIENT  PARENTERAL ANTIBIOTIC THERAPY (OPAT)  Indication: GBS PJI Regimen: Ceftriaxone 2g IV q24h End date: 03/06/27  IV antibiotic discharge orders are pended. To discharging provider:  please sign these orders via discharge navigator,  Select New Orders & click on the button choice - Manage This Unsigned Work.     Thank you for allowing pharmacy to be a part of this patient's care.  Peggyann Juba, PharmD, BCPS Pager: [phone] Pharmacy: [phone] 01/24/2017, 12:51 PM

## 2017-01-24 NOTE — Progress Notes (Signed)
Subjective: No new complaints   Antibiotics:  Anti-infectives    Start     Dose/Rate Route Frequency Ordered Stop   01/24/17 1300  cefTRIAXone (ROCEPHIN) 2 g in dextrose 5 % 50 mL IVPB     2 g 100 mL/hr over 30 Minutes Intravenous Every 24 hours 01/24/17 1149     01/23/17 1800  vancomycin (VANCOCIN) 1,250 mg in sodium chloride 0.9 % 250 mL IVPB  Status:  Discontinued     1,250 mg 166.7 mL/hr over 90 Minutes Intravenous Every 12 hours 01/23/17 1341 01/24/17 1149   01/23/17 0600  vancomycin (VANCOCIN) IVPB 750 mg/150 ml premix  Status:  Discontinued     750 mg 150 mL/hr over 60 Minutes Intravenous Every 12 hours 01/22/17 2150 01/23/17 1341   01/23/17 0000  rifampin (RIFADIN) capsule 300 mg  Status:  Discontinued     300 mg Oral Every 12 hours 01/22/17 2139 01/24/17 1149   01/22/17 1826  vancomycin (VANCOCIN) 1-5 GM/200ML-% IVPB    Comments:  Octavio Graves   : cabinet override      01/22/17 1826 01/23/17 0629      Medications: Scheduled Meds: . acetaminophen  1,000 mg Oral Q8H  . aspirin  81 mg Oral BID  . celecoxib  200 mg Oral Q12H  . docusate sodium  100 mg Oral BID  . ferrous sulfate  325 mg Oral TID PC  . levothyroxine  112 mcg Oral QAC breakfast  . linagliptin  5 mg Oral Daily  . oxyCODONE  5-15 mg Oral Q4H  . pantoprazole  40 mg Oral Daily  . polyethylene glycol  17 g Oral BID  . saccharomyces boulardii  250 mg Oral BID   Continuous Infusions: . sodium chloride Stopped (01/24/17 0600)  . cefTRIAXone (ROCEPHIN)  IV    . lactated ringers Stopped (01/22/17 2212)  . methocarbamol (ROBAXIN)  IV     PRN Meds:.ALPRAZolam, alum & mag hydroxide-simeth, bisacodyl, diphenhydrAMINE, fentaNYL (SUBLIMAZE) injection, magnesium citrate, menthol-cetylpyridinium **OR** phenol, methocarbamol **OR** methocarbamol (ROBAXIN)  IV, metoCLOPramide **OR** metoCLOPramide (REGLAN) injection, ondansetron **OR** ondansetron (ZOFRAN) IV    Objective: Weight change:    Intake/Output Summary (Last 24 hours) at 01/24/17 1229 Last data filed at 01/24/17 1207  Gross per 24 hour  Intake          1829.33 ml  Output             2201 ml  Net          -371.67 ml   Blood pressure (!) 155/81, pulse 69, temperature 98.1 F (36.7 C), temperature source Oral, resp. rate 16, height 5' 4.5" (1.638 m), weight 210 lb (95.3 kg), SpO2 100 %. Temp:  [98.1 F (36.7 C)-100.4 F (38 C)] 98.1 F (36.7 C) (05/26 0533) Pulse Rate:  [62-84] 69 (05/26 0533) Resp:  [16] 16 (05/26 0533) BP: (132-155)/(67-81) 155/81 (05/26 0533) SpO2:  [98 %-100 %] 100 % (05/26 0533)  Physical Exam: General: Alert and awake, was using the rest room ut discussed culture results with her  CBC:  CBC Latest Ref Rng & Units 01/24/2017 01/23/2017 01/22/2017  WBC 4.0 - 10.5 K/uL 13.0(H) 14.1(H) -  Hemoglobin 12.0 - 15.0 g/dL 10.6(L) 10.7(L) 12.6  Hematocrit 36.0 - 46.0 % 32.0(L) 33.0(L) -  Platelets 150 - 400 K/uL 381 271 -      BMET  Recent Labs  01/23/17 0420 01/24/17 0511  NA 136 138  K 4.4 4.4  CL 102  103  CO2 25 27  GLUCOSE 135* 209*  BUN 18 14  CREATININE 0.73 0.84  CALCIUM 9.3 9.6     Liver Panel   Recent Labs  01/24/17 0511  PROT 6.5  ALBUMIN 2.7*  AST 25  ALT 36  ALKPHOS 115  BILITOT 2.4*       Sedimentation Rate  Recent Labs  01/24/17 0511  ESRSEDRATE 120*   C-Reactive Protein  Recent Labs  01/24/17 0511  CRP 29.3*    Micro Results: Recent Results (from the past 720 hour(s))  Surgical pcr screen     Status: None   Collection Time: 01/22/17  5:35 PM  Result Value Ref Range Status   MRSA, PCR NEGATIVE NEGATIVE Final   Staphylococcus aureus NEGATIVE NEGATIVE Final    Comment:        The Xpert SA Assay (FDA approved for NASAL specimens in patients over 21 years of age), is one component of a comprehensive surveillance program.  Test performance has been validated by [facility] for patients greater than or equal to 48 year old. It is  not intended to diagnose infection nor to guide or monitor treatment.   Aerobic/Anaerobic Culture (surgical/deep wound)     Status: None (Preliminary result)   Collection Time: 01/22/17  7:06 PM  Result Value Ref Range Status   Specimen Description SYNOVIAL RIGHT KNEE  Final   Special Requests NONE  Final   Gram Stain   Final    FEW WBC PRESENT,BOTH PMN AND MONONUCLEAR RARE GRAM POSITIVE COCCI IN PAIRS    Culture   Final    FEW UNIDENTIFIED ORGANISM Performed at [facility], [place] [address] [place], [place] [address]    Report Status PENDING  Incomplete    Studies/Results: No results found.    Assessment/Plan:  INTERVAL HISTORY: GBS growing at [facility] (they will fax to floor) and organism here LOOKS to also be GBS per micro   Active Problems:   Infection of total right knee replacement ([place])   Chills    Sheri [name] is a 66 y.o. female with  GBS PJI  #1 GBS PJI  --she needs 6 weeks of IV abx, Ceftriaxone 2 grams daily should work well and be convenient in a push dose syringe  Dc vancomycin and rifampin  --she will need these IV abx immediately followed by chronic oral abx, such as amoxicillin 564m po TID for 6 months   I will arrange HSFU   Diagnosis:  PJI  Culture Result: GBS  Allergies  Allergen Reactions  . Crestor [Rosuvastatin]     Myalgias   . Niaspan [Niacin Er]     Myalgia   . Premarin [Estrogens Conjugated] Other (See Comments)    Unknown  . Provera [Medroxyprogesterone Acetate]     "makes pt crazy"    Discharge antibiotics: IV ceftriaxone 2 grams daily   Duration: 6 weeks  End Date:  July 5th, 2018  [facility]:  Labs weekly while on IV antibiotics: _x_ CBC with differential _x_ BMP  _x_ CRP x__ ESR   _x_ Please pull PIC at completion of IV antibiotics __ Please leave PIC in place until doctor has seen patient or been notified  Fax weekly labs to [phone] Clinic Follow Up  Appt:  Next 4 weeks with ID MD NOTE SHE NEEDS TO BE TRANSTIONED TO ORAL AMOXICILLIN when done with IV       LOS: 2 days   CAlcide Evener  01/24/2017, 12:29 PM

## 2017-01-24 NOTE — Progress Notes (Signed)
Physical Therapy Treatment Patient Details Name: Sheri [name] MRN: [id number] DOB: [birth year] Today's Date: 01/24/2017    History of Present Illness Pt s/p I&D of R TKR with liner replacement.  Pt with hx of DM and bilat TKR    PT Comments    Pt progressing well with mobility and eager for dc home.   Follow Up Recommendations  Home health PT     Equipment Recommendations  None recommended by PT    Recommendations for Other Services OT consult     Precautions / Restrictions Precautions Precautions: Knee;Fall Restrictions Weight Bearing Restrictions: No Other Position/Activity Restrictions: WBAT    Mobility  Bed Mobility Overal bed mobility: Needs Assistance Bed Mobility: Supine to Sit;Sit to Supine     Supine to sit: Min guard Sit to supine: Min guard   General bed mobility comments: Pt rolling OOB 2* back issues and self assisting R LE with UEs  Transfers Overall transfer level: Needs assistance Equipment used: Rolling walker (2 wheeled) Transfers: Sit to/from Stand Sit to Stand: Min guard         General transfer comment: min cues for LE management and use of UEs to self assist  Ambulation/Gait Ambulation/Gait assistance: Min guard;Supervision Ambulation Distance (Feet): 150 Feet Assistive device: Rolling walker (2 wheeled) Gait Pattern/deviations: Step-to pattern;Step-through pattern;Decreased step length - right;Decreased step length - left;Shuffle;Trunk flexed Gait velocity: decr Gait velocity interpretation: Below normal speed for age/gender General Gait Details: min cues for posture, position from [employer]            Wheelchair Mobility    Modified Rankin (Stroke Patients Only)       Balance Overall balance assessment: No apparent balance deficits (not formally assessed)                                          Cognition Arousal/Alertness: Awake/alert Behavior During Therapy: WFL for tasks  assessed/performed Overall Cognitive Status: Within Functional Limits for tasks assessed                                        Exercises Total Joint Exercises Ankle Circles/Pumps: AROM;Both;15 reps;Supine Quad Sets: AROM;Both;10 reps;Supine Heel Slides: AAROM;10 reps;Supine;Right Straight Leg Raises: AAROM;AROM;Right;15 reps;Supine    General Comments        Pertinent Vitals/Pain Pain Assessment: 0-10 Pain Score: 4  Pain Location: R knee Pain Descriptors / Indicators: Aching;Sore Pain Intervention(s): Limited activity within patient's tolerance;Monitored during session;Premedicated before session;Ice applied    Home Living                      Prior Function            PT Goals (current goals can now be found in the care plan section) Acute Rehab PT Goals Patient Stated Goal: Regain IND PT Goal Formulation: With patient Time For Goal Achievement: 01/30/17 Potential to Achieve Goals: Good Progress towards PT goals: Progressing toward goals    Frequency    7X/week      PT Plan Current plan remains appropriate    Co-evaluation              AM-PAC PT "6 Clicks" Daily Activity  Outcome Measure  Difficulty turning over in bed (including adjusting bedclothes, sheets and  blankets)?: A Little Difficulty moving from lying on back to sitting on the side of the bed? : A Little Difficulty sitting down on and standing up from a chair with arms (e.g., wheelchair, bedside commode, etc,.)?: A Little Help needed moving to and from a bed to chair (including a wheelchair)?: A Little Help needed walking in hospital room?: A Little Help needed climbing 3-5 steps with a railing? : A Little 6 Click Score: 18    End of Session Equipment Utilized During Treatment: Gait belt Activity Tolerance: Patient tolerated treatment well Patient left: in bed Nurse Communication: Mobility status PT Visit Diagnosis: Difficulty in walking, not elsewhere  classified (R26.2)     Time: 1010-1033 PT Time Calculation (min) (ACUTE ONLY): 23 min  Charges:  $Gait Training: 8-22 mins $Therapeutic Exercise: 8-22 mins                    G Codes:       Pg [phone]    Aquiles Ruffini 01/24/2017, 11:49 AM

## 2017-01-24 NOTE — Progress Notes (Signed)
   Subjective:  Patient reports pain as moderate to severe.  Controlled with oral meds at this time.  Awaiting PICC line and final cultures.  She feels like she is having urinary frequency, but is just POD #2.  Objective:   VITALS:   Vitals:   01/23/17 2128 01/23/17 2243 01/24/17 0224 01/24/17 0533  BP: (!) 149/68  132/71 (!) 155/81  Pulse: 81  84 69  Resp: 16  16 16   Temp: (!) 100.4 F (38 C) 99.1 F (37.3 C) 98.3 F (36.8 C) 98.1 F (36.7 C)  TempSrc: Oral  Oral Oral  SpO2: 98%  98% 100%  Weight:      Height:        Neurovascular intact Sensation intact distally Intact pulses distally Dorsiflexion/Plantar flexion intact Incision: dressing C/D/I Compartment soft   Lab Results  Component Value Date   WBC 13.0 (H) 01/24/2017   HGB 10.6 (L) 01/24/2017   HCT 32.0 (L) 01/24/2017   MCV 88.4 01/24/2017   PLT 381 01/24/2017   BMET    Component Value Date/Time   NA 138 01/24/2017 0511   K 4.4 01/24/2017 0511   CL 103 01/24/2017 0511   CO2 27 01/24/2017 0511   GLUCOSE 209 (H) 01/24/2017 0511   BUN 14 01/24/2017 0511   CREATININE 0.84 01/24/2017 0511   CALCIUM 9.6 01/24/2017 0511   GFRNONAA >60 01/24/2017 0511   GFRAA >60 01/24/2017 0511     Assessment/Plan: 2 Days Post-Op   Active Problems:   Infection of total right knee replacement ([facility])   Chills   Up with therapy Awaiting PICC line today and final recs from ID once cultures are final Likely dc home tomorrow, looking like Strept from cultures Will send a UA, though she has been on 2 days of Vanc SCDs and bid asa for DVT ppx House diet   Nicholes Stairs 01/24/2017, 8:15 AM   Geralynn Rile, MD [phone]

## 2017-01-24 NOTE — Progress Notes (Signed)
Patient pulled out IV while up in BR.  Refusing to be restarted at this time since she will be getting a PICC later to day.

## 2017-01-25 LAB — HIV ANTIBODY (ROUTINE TESTING W REFLEX): HIV Screen 4th Generation wRfx: NONREACTIVE

## 2017-01-25 MED ORDER — DEXTROSE 5 % IV SOLN: 2.0000 g | INTRAVENOUS | 0 refills | Status: DC

## 2017-01-25 MED ORDER — HEPARIN SOD (PORK) LOCK FLUSH 100 UNIT/ML IV SOLN
250.0000 [IU] | INTRAVENOUS | Status: AC | PRN
  Administered 2017-01-25: 250 [IU]

## 2017-01-25 MED ORDER — CEFTRIAXONE IV (FOR PTA / DISCHARGE USE ONLY): 2.0000 g | INTRAVENOUS | 0 refills | Status: AC

## 2017-01-25 MED ORDER — OXYCODONE HCL 5 MG PO TABS: 5.0000 mg | ORAL_TABLET | ORAL | 0 refills | Status: DC

## 2017-01-25 MED ORDER — METHOCARBAMOL 500 MG PO TABS: 500.0000 mg | ORAL_TABLET | Freq: Four times a day (QID) | ORAL | 1 refills | Status: DC | PRN

## 2017-01-25 NOTE — Care Management Note (Addendum)
Case Management Note  Patient Details  Name: Sheri [name] MRN: [id number] Date of Birth: 08/07/1951  Subjective/Objective:  I&D knee, infection of right total knee replacement                Action/Plan: Discharge Planning: NCM spoke to pt and offered choice for HH/list provided. Pt states she had [facility] in the past. Contacted [facility] [employer] with new referral. [facility] Liaison for IV abx, they will provide medication for [facility]. Faxed Rocephin Rx to [facility] pharmacy. Unit RN contacted attending for [facility] RN/PT orders with F2F.   PCP [name] MD   Expected Discharge Date:  01/25/17               Expected Discharge Plan:  [place]  In-House Referral:  NA  Discharge planning Services  CM Consult  Post Acute Care Choice:  Home Health Choice offered to:  Patient  DME Arranged:  [name] DME Agency:  NA  HH Arranged:  RN, PT [place] Agency:  [place]  Status of Service:  Completed, signed off  If discussed at [place] of Stay Meetings, dates discussed:    Additional Comments:  Erenest Rasher, RN 01/25/2017, 10:07 AM

## 2017-01-25 NOTE — Progress Notes (Signed)
   Subjective: 3 Days Post-Op Procedure(s) (LRB): IRRIGATION AND DEBRIDEMENT KNEE WITH POLY EXCHANGE (Right) Patient reports pain as mild.   Plan is to go Home after hospital stay.  Objective: Vital signs in last 24 hours: Temp:  [98.5 F (36.9 C)-99.1 F (37.3 C)] 98.7 F (37.1 C) (05/27 0528) Pulse Rate:  [72-81] 72 (05/27 0528) Resp:  [15-16] 16 (05/27 0528) BP: (127-153)/(67-75) 153/75 (05/27 0528) SpO2:  [99 %-100 %] 99 % (05/27 0528)  Intake/Output from previous day:  Intake/Output Summary (Last 24 hours) at 01/25/17 0737 Last data filed at 01/25/17 0529  Gross per 24 hour  Intake          1096.67 ml  Output              100 ml  Net           996.67 ml    Intake/Output this shift: No intake/output data recorded.  Labs:  Recent Labs  01/22/17 1737 01/23/17 0420 01/24/17 0511  HGB 12.6 10.7* 10.6*    Recent Labs  01/23/17 0420 01/24/17 0511  WBC 14.1* 13.0*  RBC 3.69* 3.62*  HCT 33.0* 32.0*  PLT 271 381    Recent Labs  01/23/17 0420 01/24/17 0511  NA 136 138  K 4.4 4.4  CL 102 103  CO2 25 27  BUN 18 14  CREATININE 0.73 0.84  GLUCOSE 135* 209*  CALCIUM 9.3 9.6   No results for input(s): LABPT, INR in the last 72 hours.  EXAM General - Patient is Alert, Appropriate and Oriented Extremity - Neurologically intact Neurovascular intact Incision: dressing C/D/I No cellulitis present Compartment soft Dressing/Incision - clean, dry, no drainage Motor Function - intact, moving foot and toes well on exam.   Past Medical History:  Diagnosis Date  . Allergic rhinitis   . Anxiety   . Degenerative disc disease, cervical   . Depression   . Diabetes mellitus without complication ([place])    type two  . Diarrhea 04/2012   colon nl, BX NPD; no response to cholestyramin 03-2012 no response to align; transient resp to sucralfate 12-13  . H/O CT scan of abdomen 08/2012   neg; egd bile reflux gastritisand linear eros; transient resp to sucralfate   . H/O  ulcer disease   . Heart murmur    hx of   . History of blood transfusion   . Hyperlipidemia   . Hyperplastic polyps of stomach   . Hypertension   . Hypothyroidism   . IBS (irritable bowel syndrome)   . Kidney stone   . Kissing, osteophytes    Dr Carloyn Manner  . Menopause   . Mild anemia 06/2011   14% iron sat,ferritin 14- from Metformin - anemia resolved   . Obesity   . Osteoarthritis     Assessment/Plan: 3 Days Post-Op Procedure(s) (LRB): IRRIGATION AND DEBRIDEMENT KNEE WITH POLY EXCHANGE (Right) Active Problems:   Infection of total right knee replacement ([place])   Chills   Group B streptococcal infection   Discharge home with home health  Weight-Bearing as tolerated to right leg  Sheri [name] V 01/25/2017, 7:37 AM

## 2017-01-25 NOTE — Progress Notes (Signed)
Pt to d/c home. [facility] set-up to provide [facility] antibiotics. AVS reviewed and "My Chart" discussed with pt. Pt capable of verbalizing medications, signs and symptoms of infection, and follow-up appointments. Remains hemodynamically stable. No signs and symptoms of distress. Educated pt to return to ER in the case of SOB, dizziness, or chest pain.

## 2017-01-25 NOTE — Progress Notes (Signed)
Physical Therapy Treatment Patient Details Name: Sheri [name] MRN: [id number] DOB: Jan 25, 1951 Today's Date: 01/25/2017    History of Present Illness Pt s/p I&D of R TKR with liner replacement.  Pt with hx of DM and bilat TKR    PT Comments    Pt continues cooperative and eager for dc home.   Follow Up Recommendations  No PT follow up (Pt declines HHPT - states has been throught this before )     Equipment Recommendations  None recommended by PT    Recommendations for Other Services OT consult     Precautions / Restrictions Precautions Precautions: Knee;Fall Restrictions Weight Bearing Restrictions: No Other Position/Activity Restrictions: WBAT    Mobility  Bed Mobility               General bed mobility comments: Pt up in recliner and requests back to same  Transfers Overall transfer level: Needs assistance Equipment used: Rolling walker (2 wheeled) Transfers: Sit to/from Stand Sit to Stand: Supervision         General transfer comment: min cues for LE management and use of UEs to self assist  Ambulation/Gait Ambulation/Gait assistance: Min guard;Supervision Ambulation Distance (Feet): 160 Feet Assistive device: 4-wheeled walker Gait Pattern/deviations: Step-to pattern;Step-through pattern;Decreased step length - right;Decreased step length - left;Shuffle;Trunk flexed Gait velocity: decr Gait velocity interpretation: Below normal speed for age/gender General Gait Details: min cues for posture, position from [employer]            Wheelchair Mobility    Modified Rankin (Stroke Patients Only)       Balance                                            Cognition Arousal/Alertness: Awake/alert Behavior During Therapy: WFL for tasks assessed/performed Overall Cognitive Status: Within Functional Limits for tasks assessed                                        Exercises      General Comments         Pertinent Vitals/Pain Pain Assessment: 0-10 Pain Score: 4  Pain Location: R knee Pain Descriptors / Indicators: Aching;Sore Pain Intervention(s): Limited activity within patient's tolerance;Monitored during session;Premedicated before session    Home Living                      Prior Function            PT Goals (current goals can now be found in the care plan section) Acute Rehab PT Goals Patient Stated Goal: Regain IND PT Goal Formulation: With patient Time For Goal Achievement: 01/30/17 Potential to Achieve Goals: Good Progress towards PT goals: Progressing toward goals    Frequency    7X/week      PT Plan Discharge plan needs to be updated    Co-evaluation              AM-PAC PT "6 Clicks" Daily Activity  Outcome Measure  Difficulty turning over in bed (including adjusting bedclothes, sheets and blankets)?: A Little Difficulty moving from lying on back to sitting on the side of the bed? : A Little Difficulty sitting down on and standing up from a chair with arms (e.g., wheelchair, bedside commode,  etc,.)?: A Little Help needed moving to and from a bed to chair (including a wheelchair)?: A Little Help needed walking in hospital room?: A Little Help needed climbing 3-5 steps with a railing? : A Little 6 Click Score: 18    End of Session Equipment Utilized During Treatment: Gait belt Activity Tolerance: Patient tolerated treatment well Patient left: in chair;with call bell/phone within reach;with family/visitor present Nurse Communication: Mobility status PT Visit Diagnosis: Difficulty in walking, not elsewhere classified (R26.2)     Time: 1018-1040 PT Time Calculation (min) (ACUTE ONLY): 22 min  Charges:  $Gait Training: 8-22 mins                    G Codes:       Pg [phone]    Antwone Capozzoli 01/25/2017, 12:17 PM

## 2017-01-25 NOTE — Discharge Instructions (Signed)
Dr. Gaynelle Arabian [facility] [address] [place], [place] [address] [phone]  TOTAL KNEE REPLACEMENT POSTOPERATIVE DIRECTIONS  Knee Rehabilitation, Guidelines Following Surgery  Results after knee surgery are often greatly improved when you follow the exercise, range of motion and muscle strengthening exercises prescribed by your doctor. Safety measures are also important to protect the knee from further injury. Any time any of these exercises cause you to have increased pain or swelling in your knee joint, decrease the amount until you are comfortable again and slowly increase them. If you have problems or questions, call your caregiver or physical therapist for advice.   HOME CARE INSTRUCTIONS  Remove items at home which could result in a fall. This includes throw rugs or furniture in walking pathways.   ICE to the affected knee every three hours for 30 minutes at a time and then as needed for pain and swelling.  Continue to use ice on the knee for pain and swelling from surgery. You may notice swelling that will progress down to the foot and ankle.  This is normal after surgery.  Elevate the leg when you are not up walking on it.    Continue to use the breathing machine which will help keep your temperature down.  It is common for your temperature to cycle up and down following surgery, especially at night when you are not up moving around and exerting yourself.  The breathing machine keeps your lungs expanded and your temperature down.  Do not place pillow under knee, focus on keeping the knee straight while resting  DIET You may resume your previous home diet once your are discharged from the hospital.  DRESSING / WOUND CARE / SHOWERING You may start showering once you are discharged home but do not submerge the incision under water. Just pat the incision dry and apply a dry gauze dressing on daily. Change the surgical dressing  daily after 7 days  ACTIVITY Walk with your walker as instructed. Use walker as long as suggested by your caregivers. Avoid periods of inactivity such as sitting longer than an hour when not asleep. This helps prevent blood clots.  You may resume a sexual relationship in one month or when given the OK by your doctor.  You may return to work once you are cleared by your doctor.  Do not drive a car for 6 weeks or until released by you surgeon.  Do not drive while taking narcotics.  WEIGHT BEARING Weight bearing as tolerated with assist device (walker, cane, etc) as directed, use it as long as suggested by your surgeon or therapist, typically at least 4-6 weeks.  POSTOPERATIVE CONSTIPATION PROTOCOL Constipation - defined medically as fewer than three stools per week and severe constipation as less than one stool per week.  One of the most common issues patients have following surgery is constipation.  Even if you have a regular bowel pattern at home, your normal regimen is likely to be disrupted due to multiple reasons following surgery.  Combination of anesthesia, postoperative narcotics, change in appetite and fluid intake all can affect your bowels.  In order to avoid complications following surgery, here are some recommendations in order to help you during your recovery period.  Colace (docusate) - Pick up an over-the-counter form of Colace or another stool softener and take twice a day as long as you are requiring postoperative pain medications.  Take with a full glass of water daily.  If you  experience loose stools or diarrhea, hold the colace until you stool forms back up.  If your symptoms do not get better within 1 week or if they get worse, check with your doctor.  Dulcolax (bisacodyl) - Pick up over-the-counter and take as directed by the product packaging as needed to assist with the movement of your bowels.  Take with a full glass of water.  Use this product as needed if not relieved  by Colace only.   MiraLax (polyethylene glycol) - Pick up over-the-counter to have on hand.  MiraLax is a solution that will increase the amount of water in your bowels to assist with bowel movements.  Take as directed and can mix with a glass of water, juice, soda, coffee, or tea.  Take if you go more than two days without a movement. Do not use MiraLax more than once per day. Call your doctor if you are still constipated or irregular after using this medication for 7 days in a row.  If you continue to have problems with postoperative constipation, please contact the office for further assistance and recommendations.  If you experience "the worst abdominal pain ever" or develop nausea or vomiting, please contact the office immediatly for further recommendations for treatment.  ITCHING  If you experience itching with your medications, try taking only a single pain pill, or even half a pain pill at a time.  You can also use Benadryl over the counter for itching or also to help with sleep.   TED HOSE STOCKINGS Wear the elastic stockings on both legs for three weeks following surgery during the day but you may remove then at night for sleeping.  MEDICATIONS See your medication summary on the After Visit Summary that the nursing staff will review with you prior to discharge.  You may have some home medications which will be placed on hold until you complete the course of blood thinner medication.  It is important for you to complete the blood thinner medication as prescribed by your surgeon.  Continue your approved medications as instructed at time of discharge.  PRECAUTIONS If you experience chest pain or shortness of breath - call 911 immediately for transfer to the hospital emergency department.  If you develop a fever greater that 101 F, purulent drainage from wound, increased redness or drainage from wound, foul odor from the wound/dressing, or calf pain - CONTACT YOUR SURGEON.                                                    FOLLOW-UP APPOINTMENTS Make sure you keep all of your appointments after your operation with your surgeon and caregivers. You should call the office at the above phone number and make an appointment for approximately two weeks after the date of your surgery or on the date instructed by your surgeon outlined in the "After Visit Summary".   RANGE OF MOTION AND STRENGTHENING EXERCISES  Rehabilitation of the knee is important following a knee injury or an operation. After just a few days of immobilization, the muscles of the thigh which control the knee become weakened and shrink (atrophy). Knee exercises are designed to build up the tone and strength of the thigh muscles and to improve knee motion. Often times heat used for twenty to thirty minutes before working out will loosen up your tissues and  help with improving the range of motion but do not use heat for the first two weeks following surgery. These exercises can be done on a training (exercise) mat, on the floor, on a table or on a bed. Use what ever works the best and is most comfortable for you Knee exercises include:  Leg Lifts - While your knee is still immobilized in a splint or cast, you can do straight leg raises. Lift the leg to 60 degrees, hold for 3 sec, and slowly lower the leg. Repeat 10-20 times 2-3 times daily. Perform this exercise against resistance later as your knee gets better.  Quad and Hamstring Sets - Tighten up the muscle on the front of the thigh (Quad) and hold for 5-10 sec. Repeat this 10-20 times hourly. Hamstring sets are done by pushing the foot backward against an object and holding for 5-10 sec. Repeat as with quad sets.   Leg Slides: Lying on your back, slowly slide your foot toward your buttocks, bending your knee up off the floor (only go as far as is comfortable). Then slowly slide your foot back down until your leg is flat on the floor again.  Angel Wings: Lying on your back spread  your legs to the side as far apart as you can without causing discomfort.  A rehabilitation program following serious knee injuries can speed recovery and prevent re-injury in the future due to weakened muscles. Contact your doctor or a physical therapist for more information on knee rehabilitation.   IF YOU ARE TRANSFERRED TO A SKILLED REHAB FACILITY If the patient is transferred to a skilled rehab facility following release from the hospital, a list of the current medications will be sent to the facility for the patient to continue.  When discharged from the skilled rehab facility, please have the facility set up the patient's [place] prior to being released. Also, the skilled facility will be responsible for providing the patient with their medications at time of release from the facility to include their pain medication, the muscle relaxants, and their blood thinner medication. If the patient is still at the rehab facility at time of the two week follow up appointment, the skilled rehab facility will also need to assist the patient in arranging follow up appointment in our office and any transportation needs.  MAKE SURE YOU:  Understand these instructions.  Get help right away if you are not doing well or get worse.    Pick up stool softner and laxative for home use following surgery while on pain medications. Do not submerge incision under water. Please use good hand washing techniques while changing dressing each day. May shower starting three days after surgery. Please use a clean towel to pat the incision dry following showers. Continue to use ice for pain and swelling after surgery. Do not use any lotions or creams on the incision until instructed by your surgeon.

## 2017-01-26 DIAGNOSIS — I10 Essential (primary) hypertension: Secondary | ICD-10-CM | POA: Diagnosis not present

## 2017-01-26 DIAGNOSIS — E119 Type 2 diabetes mellitus without complications: Secondary | ICD-10-CM | POA: Diagnosis not present

## 2017-01-26 DIAGNOSIS — M5030 Other cervical disc degeneration, unspecified cervical region: Secondary | ICD-10-CM | POA: Diagnosis not present

## 2017-01-26 DIAGNOSIS — B951 Streptococcus, group B, as the cause of diseases classified elsewhere: Secondary | ICD-10-CM | POA: Diagnosis not present

## 2017-01-26 DIAGNOSIS — F419 Anxiety disorder, unspecified: Secondary | ICD-10-CM | POA: Diagnosis not present

## 2017-01-26 DIAGNOSIS — T8453XA Infection and inflammatory reaction due to internal right knee prosthesis, initial encounter: Secondary | ICD-10-CM | POA: Diagnosis not present

## 2017-01-26 LAB — HEPATITIS B SURFACE ANTIGEN: Hepatitis B Surface Ag: NEGATIVE

## 2017-01-26 LAB — HCV COMMENT:

## 2017-01-26 LAB — URINE CULTURE: Culture: NO GROWTH

## 2017-01-26 LAB — HEPATITIS C ANTIBODY (REFLEX): HCV Ab: <0.1 s/co ratio (ref 0.0–0.9)

## 2017-01-27 DIAGNOSIS — I10 Essential (primary) hypertension: Secondary | ICD-10-CM | POA: Diagnosis not present

## 2017-01-27 DIAGNOSIS — B951 Streptococcus, group B, as the cause of diseases classified elsewhere: Secondary | ICD-10-CM | POA: Diagnosis not present

## 2017-01-27 DIAGNOSIS — E119 Type 2 diabetes mellitus without complications: Secondary | ICD-10-CM | POA: Diagnosis not present

## 2017-01-27 DIAGNOSIS — T8453XA Infection and inflammatory reaction due to internal right knee prosthesis, initial encounter: Secondary | ICD-10-CM | POA: Diagnosis not present

## 2017-01-27 DIAGNOSIS — F419 Anxiety disorder, unspecified: Secondary | ICD-10-CM | POA: Diagnosis not present

## 2017-01-27 DIAGNOSIS — M5030 Other cervical disc degeneration, unspecified cervical region: Secondary | ICD-10-CM | POA: Diagnosis not present

## 2017-01-27 NOTE — Progress Notes (Signed)
     Subjective: 1 Days Post-Op Procedure(s) (LRB): IRRIGATION AND DEBRIDEMENT KNEE WITH POLY EXCHANGE (Right)   Patient reports pain as moderate, pain controlled.  No events throughout the night.   Objective:   VITALS:    01/23/17 0610  BP: (!) 132/61  Pulse: 72  Resp: 16  Temp: 98.3 F (36.8 C)    Dorsiflexion/Plantar flexion intact Incision: dressing C/D/I No cellulitis present Compartment soft  LABS  Recent Labs  01/23/17 0429  HGB 10.7*  HCT 33.0*  WBC 14.1*  PLT 271        Recent Labs  01/23/17 0429  NA 136  K 4.4  BUN 18  CREATININE 0.73  GLUCOSE 135       Assessment/Plan: 1 Days Post-Op Procedure(s) (LRB): IRRIGATION AND DEBRIDEMENT KNEE WITH POLY EXCHANGE (Right) Foley cath d/c'ed Advance diet Up with therapy D/C IV fluids Discharge home eventually, when ready  ID Consult PICC line already ordered    [place] Pugh. Shakirah Kirkey   PAC  01/27/2017, 3:23 PM

## 2017-01-27 NOTE — Discharge Summary (Signed)
Physician Discharge Summary  [name] MRN: [id number] DOB/AGE: [birth year] 67 y.o.  Admit date: 01/22/2017 Discharge date: 01/25/2017   Procedures:  Procedure(s) (LRB): IRRIGATION AND DEBRIDEMENT KNEE WITH POLY EXCHANGE (Right)  Attending Physician:  [name]   Admission Diagnoses:   Infected right total knee arthroplasty  Discharge Diagnoses:  Active Problems:   Infection of total right knee replacement (HCC)   Chills   Group B streptococcal infection  Past Medical History:  Diagnosis Date  . Allergic rhinitis   . Anxiety   . Degenerative disc disease, cervical   . Depression   . Diabetes mellitus without complication (Sheri [name])    type two  . Diarrhea 04/2012   colon nl, BX NPD; no response to cholestyramin 03-2012 no response to align; transient resp to sucralfate 12-13  . H/O CT scan of abdomen 08/2012   neg; egd bile reflux gastritisand linear eros; transient resp to sucralfate   . H/O ulcer disease   . Heart murmur    hx of   . History of blood transfusion   . Hyperlipidemia   . Hyperplastic polyps of stomach   . Hypertension   . Hypothyroidism   . IBS (irritable bowel syndrome)   . Kidney stone   . Kissing, osteophytes    Dr Carloyn Manner  . Menopause   . Mild anemia 06/2011   14% iron sat,ferritin 14- from Metformin - anemia resolved   . Obesity   . Osteoarthritis     HPI:    Pt is a 66 y.o. female complaining of right knee acutely.  Pain and swelling have been consistent since it started. Patient has a history of a right TKA by [name] in [place], [place] in 2013, [name] did the left TKA in 2016. She denies any fever or chills, but has had persistent swelling.   [name] aspirated the knee in the office and infection was apparent.  [name] discussed the case with [name].   Various options are discussed with the patient. Risks, benefits and expectations were discussed with the patient. Patient understand the risks, benefits and  expectations and wishes to proceed with surgery.   PCP: [name], MD   Discharged Condition: good  Hospital Course:  Patient underwent the above stated procedure on 01/22/2017. Patient tolerated the procedure well and brought to the recovery room in good condition and subsequently to the floor.  POD #1 BP: 132/61 ; Pulse: 72 ; Temp: 98.3 F (36.8 C) ; Resp: 16 Patient reports pain as moderate, pain controlled.  No events throughout the night.  Dorsiflexion/plantar flexion intact, incision: dressing C/D/I, no cellulitis present and compartment soft.   LABS  Basename    HGB     10.7  HCT     33.0   POD #2  BP: 155/81 ; Pulse: 69 ; Temp: 98.1 F (36.7 C) ; Resp: 16 Patient reports pain as moderate to severe.  Controlled with oral meds at this time.  Awaiting PICC line and final cultures.  She feels like she is having urinary frequency, but is just POD #2. Neurovascular intact, sensation intact distally, intact pulses distally, dorsiflexion/plantar flexion intact, incision: dressing C/D/I and compartment soft.  LABS  Basename    HGB     10.6  HCT     32.0   POD #3  BP: 153/75 ; Pulse: 72 ; Temp: 98.7 F (37.1 C) ; Resp: 16 Patient reports pain as mild.  Plan is to  go Home after hospital stay. General - Patient is Alert, Appropriate and Oriented.  Extremity - Neurologically intact, neurovascular intact, incision: dressing C/D/I, no cellulitis present and compartment soft.  Dressing/Incision - clean, dry, no drainage.  Motor Function - intact, moving foot and toes well on exam.   LABS   No new labs   Discharge Exam: General appearance: alert, cooperative and no distress Extremities: Homans sign is negative, no sign of DVT, no edema, redness or tenderness in the calves or thighs and no ulcers, gangrene or trophic changes  Disposition: Home with follow up in 2 weeks   Follow-up Information    Paralee Cancel, MD. Schedule an appointment as soon as possible for a visit in 2  week(s).   Specialty:  Orthopedic Surgery Why:  Call [phone] tomorrow to make the appointment Contact information: [address] [place] [address] [phone]        Paralee Cancel, MD Follow up.   Specialty:  Orthopedic Surgery Contact information: [address] [place] [place] [address]        [facility], [facility] Follow up.   Why:  will provide IV antibiotic  Contact information: [address] [place] [address] [phone]        Home, [facility] Follow up.   Specialty:  [place] Why:  Home Health RN Contact information: [place]  [address] [phone]           Discharge Instructions    Home infusion instructions [place] May follow [place] Dosing Protocol; May administer Cathflo as needed to maintain patency of vascular access device.; Flushing of vascular access device: per [facility] Protocol: 0.9% NaCl pre/post medica...    Complete by:  As directed    Instructions:  May follow [place] Dosing Protocol   Instructions:  May administer Cathflo as needed to maintain patency of vascular access device.   Instructions:  Flushing of vascular access device: per [facility] Protocol: 0.9% NaCl pre/post medication administration and prn patency; Heparin 100 u/ml, 94m for implanted ports and Heparin 10u/ml, 557mfor all other central venous catheters.   Instructions:  May follow [facility] Anaphylaxis Protocol for First Dose Administration in the home: 0.9% NaCl at 25-50 ml/hr to maintain IV access for protocol meds. Epinephrine 0.3 ml IV/IM PRN and Benadryl 25-50 IV/IM PRN s/s of anaphylaxis.   Instructions:  [facility] Coord[facility] (RN) to assist per patient IV care needs in the home PRN.      Allergies as of 01/25/2017      Reactions   Crestor [rosuvastatin]    Myalgias   Niaspan [niacin Er]    Myalgia   Premarin [estrogens Conjugated] Other (See  Comments)   Unknown   Provera [medroxyprogesterone Acetate]    "makes pt crazy"      Medication List    TAKE these medications   acetaminophen 650 MG CR tablet Commonly known as:  TYLENOL Take 1,300 mg by mouth every 8 (eight) hours as needed for pain.   ALPRAZolam 0.25 MG tablet Commonly known as:  XANAX TK 1 T PO QD PRN FOR ANXIETY   amLODipine-benazepril 5-20 MG capsule Commonly known as:  LOTREL Take 1 capsule by mouth every morning.   aspirin EC 81 MG tablet Take 81 mg by mouth daily.   cefTRIAXone 2 g in dextrose 5 % 50 mL Inject 2 g into the vein daily.   cefTRIAXone IVPB Commonly  known as:  ROCEPHIN Inject 2 g into the vein daily. Indication:  Group B Strep PJI Last Day of Therapy:  03/05/17 Labs - Once weekly:  CBC/D and BMP, Labs - Every other week:  ESR and CRP   cholecalciferol 1000 units tablet Commonly known as:  VITAMIN D Take 1,000 Units by mouth 3 (three) times a week.   meloxicam 15 MG tablet Commonly known as:  MOBIC Take 15 mg by mouth daily.   methocarbamol 500 MG tablet Commonly known as:  ROBAXIN Take 1 tablet (500 mg total) by mouth every 6 (six) hours as needed for muscle spasms.   oxyCODONE 5 MG immediate release tablet Commonly known as:  Oxy IR/ROXICODONE Take 1-3 tablets (5-15 mg total) by mouth every 4 (four) hours.   pantoprazole 40 MG tablet Commonly known as:  PROTONIX Take 40 mg by mouth daily.   pyridOXINE 100 MG tablet Commonly known as:  VITAMIN B-6 Take 100 mg by mouth 3 (three) times a week.   rosuvastatin 5 MG tablet Commonly known as:  CRESTOR Take 1 tablet (5 mg total) by mouth daily.   sitaGLIPtin 100 MG tablet Commonly known as:  JANUVIA Take 100 mg by mouth daily.   SYNTHROID 112 MCG tablet Generic drug:  levothyroxine TK 1 T PO QD IN THE MORNING OES   traMADol 50 MG tablet Commonly known as:  ULTRAM Take 1-2 tablets (50-100 mg total) by mouth every 6 (six) hours as needed for moderate pain.             Home Infusion Instuctions        Start     Ordered   01/25/17 0000  Home infusion instructions [facility] May follow [place] Dosing Protocol; May administer Cathflo as needed to maintain patency of vascular access device.; Flushing of vascular access device: per [facility] Protocol: 0.9% NaCl pre/post medica...    Question Answer Comment  Instructions May follow [place] Dosing Protocol   Instructions May administer Cathflo as needed to maintain patency of vascular access device.   Instructions Flushing of vascular access device: per [facility] Protocol: 0.9% NaCl pre/post medication administration and prn patency; Heparin 100 u/ml, 6m for implanted ports and Heparin 10u/ml, 562mfor all other central venous catheters.   Instructions May follow [facility] Anaphylaxis Protocol for First Dose Administration in the home: 0.9% NaCl at 25-50 ml/hr to maintain IV access for protocol meds. Epinephrine 0.3 ml IV/IM PRN and Benadryl 25-50 IV/IM PRN s/s of anaphylaxis.   Instructions [facility] Infusion Coordinator (RN) to assist per patient IV care needs in the home PRN.      01/25/17 0752       Signed: [place]West PughBabish   PA-C  01/27/2017, 3:19 PM

## 2017-01-28 DIAGNOSIS — F419 Anxiety disorder, unspecified: Secondary | ICD-10-CM | POA: Diagnosis not present

## 2017-01-28 DIAGNOSIS — B951 Streptococcus, group B, as the cause of diseases classified elsewhere: Secondary | ICD-10-CM | POA: Diagnosis not present

## 2017-01-28 DIAGNOSIS — T8453XA Infection and inflammatory reaction due to internal right knee prosthesis, initial encounter: Secondary | ICD-10-CM | POA: Diagnosis not present

## 2017-01-28 DIAGNOSIS — M5030 Other cervical disc degeneration, unspecified cervical region: Secondary | ICD-10-CM | POA: Diagnosis not present

## 2017-01-28 DIAGNOSIS — E119 Type 2 diabetes mellitus without complications: Secondary | ICD-10-CM | POA: Diagnosis not present

## 2017-01-28 DIAGNOSIS — I10 Essential (primary) hypertension: Secondary | ICD-10-CM | POA: Diagnosis not present

## 2017-01-28 LAB — AEROBIC/ANAEROBIC CULTURE W GRAM STAIN (SURGICAL/DEEP WOUND)

## 2017-02-02 DIAGNOSIS — E119 Type 2 diabetes mellitus without complications: Secondary | ICD-10-CM | POA: Diagnosis not present

## 2017-02-02 DIAGNOSIS — M5030 Other cervical disc degeneration, unspecified cervical region: Secondary | ICD-10-CM | POA: Diagnosis not present

## 2017-02-02 DIAGNOSIS — F419 Anxiety disorder, unspecified: Secondary | ICD-10-CM | POA: Diagnosis not present

## 2017-02-02 DIAGNOSIS — R7889 Finding of other specified substances, not normally found in blood: Secondary | ICD-10-CM | POA: Diagnosis not present

## 2017-02-02 DIAGNOSIS — R7982 Elevated C-reactive protein (CRP): Secondary | ICD-10-CM | POA: Diagnosis not present

## 2017-02-02 DIAGNOSIS — N390 Urinary tract infection, site not specified: Secondary | ICD-10-CM | POA: Diagnosis not present

## 2017-02-02 DIAGNOSIS — I10 Essential (primary) hypertension: Secondary | ICD-10-CM | POA: Diagnosis not present

## 2017-02-02 DIAGNOSIS — R6889 Other general symptoms and signs: Secondary | ICD-10-CM | POA: Diagnosis not present

## 2017-02-02 DIAGNOSIS — R700 Elevated erythrocyte sedimentation rate: Secondary | ICD-10-CM | POA: Diagnosis not present

## 2017-02-02 DIAGNOSIS — T8453XA Infection and inflammatory reaction due to internal right knee prosthesis, initial encounter: Secondary | ICD-10-CM | POA: Diagnosis not present

## 2017-02-02 DIAGNOSIS — B951 Streptococcus, group B, as the cause of diseases classified elsewhere: Secondary | ICD-10-CM | POA: Diagnosis not present

## 2017-02-05 DIAGNOSIS — B951 Streptococcus, group B, as the cause of diseases classified elsewhere: Secondary | ICD-10-CM | POA: Diagnosis not present

## 2017-02-05 DIAGNOSIS — I10 Essential (primary) hypertension: Secondary | ICD-10-CM | POA: Diagnosis not present

## 2017-02-05 DIAGNOSIS — E119 Type 2 diabetes mellitus without complications: Secondary | ICD-10-CM | POA: Diagnosis not present

## 2017-02-05 DIAGNOSIS — F419 Anxiety disorder, unspecified: Secondary | ICD-10-CM | POA: Diagnosis not present

## 2017-02-05 DIAGNOSIS — T8453XA Infection and inflammatory reaction due to internal right knee prosthesis, initial encounter: Secondary | ICD-10-CM | POA: Diagnosis not present

## 2017-02-05 DIAGNOSIS — M5030 Other cervical disc degeneration, unspecified cervical region: Secondary | ICD-10-CM | POA: Diagnosis not present

## 2017-02-06 DIAGNOSIS — Z4789 Encounter for other orthopedic aftercare: Secondary | ICD-10-CM | POA: Diagnosis not present

## 2017-02-06 DIAGNOSIS — Z96651 Presence of right artificial knee joint: Secondary | ICD-10-CM | POA: Diagnosis not present

## 2017-02-06 DIAGNOSIS — Z471 Aftercare following joint replacement surgery: Secondary | ICD-10-CM | POA: Diagnosis not present

## 2017-02-09 DIAGNOSIS — I10 Essential (primary) hypertension: Secondary | ICD-10-CM | POA: Diagnosis not present

## 2017-02-09 DIAGNOSIS — F419 Anxiety disorder, unspecified: Secondary | ICD-10-CM | POA: Diagnosis not present

## 2017-02-09 DIAGNOSIS — M5030 Other cervical disc degeneration, unspecified cervical region: Secondary | ICD-10-CM | POA: Diagnosis not present

## 2017-02-09 DIAGNOSIS — T8453XA Infection and inflammatory reaction due to internal right knee prosthesis, initial encounter: Secondary | ICD-10-CM | POA: Diagnosis not present

## 2017-02-09 DIAGNOSIS — E119 Type 2 diabetes mellitus without complications: Secondary | ICD-10-CM | POA: Diagnosis not present

## 2017-02-09 DIAGNOSIS — B951 Streptococcus, group B, as the cause of diseases classified elsewhere: Secondary | ICD-10-CM | POA: Diagnosis not present

## 2017-02-11 ENCOUNTER — Telehealth: Payer: Self-pay | Admitting: *Deleted

## 2017-02-11 DIAGNOSIS — B951 Streptococcus, group B, as the cause of diseases classified elsewhere: Secondary | ICD-10-CM | POA: Diagnosis not present

## 2017-02-11 DIAGNOSIS — T8453XA Infection and inflammatory reaction due to internal right knee prosthesis, initial encounter: Secondary | ICD-10-CM | POA: Diagnosis not present

## 2017-02-11 DIAGNOSIS — I10 Essential (primary) hypertension: Secondary | ICD-10-CM | POA: Diagnosis not present

## 2017-02-11 DIAGNOSIS — F419 Anxiety disorder, unspecified: Secondary | ICD-10-CM | POA: Diagnosis not present

## 2017-02-11 DIAGNOSIS — E119 Type 2 diabetes mellitus without complications: Secondary | ICD-10-CM | POA: Diagnosis not present

## 2017-02-11 DIAGNOSIS — M5030 Other cervical disc degeneration, unspecified cervical region: Secondary | ICD-10-CM | POA: Diagnosis not present

## 2017-02-11 NOTE — Telephone Encounter (Signed)
Received call from patient stating Dr Alvan Dame discontinued patient's labs, stating they were unnecessary and expensive. Per Jackelyn Poling and Linganore at [facility], the lab orders are still active and should be drawn this week.  RN asked that pharmacy confirm this with nursing ([facility]). Landis Gandy, RN

## 2017-02-11 NOTE — Telephone Encounter (Signed)
Safety labs are NEVER unnecessary. If [name] is NOT taking legal responsibility and WE are then we are doing the labs. YES I would be much more worried with vancomycin and would refuse to superfise without labs but we need labs with CTX also

## 2017-02-12 DIAGNOSIS — I10 Essential (primary) hypertension: Secondary | ICD-10-CM | POA: Diagnosis not present

## 2017-02-12 DIAGNOSIS — R6889 Other general symptoms and signs: Secondary | ICD-10-CM | POA: Diagnosis not present

## 2017-02-12 DIAGNOSIS — M064 Inflammatory polyarthropathy: Secondary | ICD-10-CM | POA: Diagnosis not present

## 2017-02-12 DIAGNOSIS — B951 Streptococcus, group B, as the cause of diseases classified elsewhere: Secondary | ICD-10-CM | POA: Diagnosis not present

## 2017-02-12 DIAGNOSIS — R7982 Elevated C-reactive protein (CRP): Secondary | ICD-10-CM | POA: Diagnosis not present

## 2017-02-12 DIAGNOSIS — M5030 Other cervical disc degeneration, unspecified cervical region: Secondary | ICD-10-CM | POA: Diagnosis not present

## 2017-02-12 DIAGNOSIS — T8453XA Infection and inflammatory reaction due to internal right knee prosthesis, initial encounter: Secondary | ICD-10-CM | POA: Diagnosis not present

## 2017-02-12 DIAGNOSIS — E119 Type 2 diabetes mellitus without complications: Secondary | ICD-10-CM | POA: Diagnosis not present

## 2017-02-12 DIAGNOSIS — F419 Anxiety disorder, unspecified: Secondary | ICD-10-CM | POA: Diagnosis not present

## 2017-02-12 NOTE — Telephone Encounter (Signed)
Patient does not qualify for home health nursing as she is no longer homebound. Patient will need to go to short stay for dressing changes and lab draws weekly for 2 weeks.  Mary from [facility] is faxing the paperwork for Dr Tommy Medal. Lavone Neri, Lanice Schwab, RN

## 2017-02-15 NOTE — Telephone Encounter (Signed)
Thanks Michelle

## 2017-02-16 NOTE — Telephone Encounter (Signed)
Orders signed by Dr Tommy Medal, faxed back to [facility] and [facility] 6/15.

## 2017-02-17 ENCOUNTER — Encounter: Payer: Self-pay | Admitting: Internal Medicine

## 2017-02-17 DIAGNOSIS — I10 Essential (primary) hypertension: Secondary | ICD-10-CM | POA: Diagnosis not present

## 2017-02-17 DIAGNOSIS — F419 Anxiety disorder, unspecified: Secondary | ICD-10-CM | POA: Diagnosis not present

## 2017-02-17 DIAGNOSIS — B951 Streptococcus, group B, as the cause of diseases classified elsewhere: Secondary | ICD-10-CM | POA: Diagnosis not present

## 2017-02-17 DIAGNOSIS — E119 Type 2 diabetes mellitus without complications: Secondary | ICD-10-CM | POA: Diagnosis not present

## 2017-02-17 DIAGNOSIS — T8453XA Infection and inflammatory reaction due to internal right knee prosthesis, initial encounter: Secondary | ICD-10-CM | POA: Diagnosis not present

## 2017-02-17 DIAGNOSIS — M5030 Other cervical disc degeneration, unspecified cervical region: Secondary | ICD-10-CM | POA: Diagnosis not present

## 2017-02-19 NOTE — Addendum Note (Signed)
Addended by: Kyra Leyland E on: 02/19/2017 04:57 PM   Modules accepted: Orders, SmartSet

## 2017-02-24 ENCOUNTER — Ambulatory Visit (HOSPITAL_COMMUNITY)
Admission: RE | Admit: 2017-02-24 | Discharge: 2017-02-24 | Disposition: A | Discharge status: Home / Self Care | Payer: Medicare Other | Source: Ambulatory Visit | Attending: Infectious Disease | Admitting: Infectious Disease

## 2017-02-24 ENCOUNTER — Encounter (HOSPITAL_COMMUNITY): Payer: Self-pay

## 2017-02-24 ENCOUNTER — Other Ambulatory Visit (HOSPITAL_COMMUNITY): Payer: Self-pay | Admitting: Infectious Disease

## 2017-02-24 DIAGNOSIS — K219 Gastro-esophageal reflux disease without esophagitis: Secondary | ICD-10-CM | POA: Diagnosis not present

## 2017-02-24 DIAGNOSIS — Z4800 Encounter for change or removal of nonsurgical wound dressing: Secondary | ICD-10-CM | POA: Insufficient documentation

## 2017-02-24 DIAGNOSIS — E785 Hyperlipidemia, unspecified: Secondary | ICD-10-CM | POA: Diagnosis not present

## 2017-02-24 DIAGNOSIS — E119 Type 2 diabetes mellitus without complications: Secondary | ICD-10-CM | POA: Insufficient documentation

## 2017-02-24 LAB — CBC WITH DIFFERENTIAL/PLATELET
Basophils Absolute: 0.1 10*3/uL (ref 0.0–0.1)
Basophils Relative: 1 %
Eosinophils Absolute: 0.3 10*3/uL (ref 0.0–0.7)
Eosinophils Relative: 4 %
HCT: 34.1 % — ABNORMAL LOW (ref 36.0–46.0)
Hemoglobin: 10.9 g/dL — ABNORMAL LOW (ref 12.0–15.0)
Lymphocytes Relative: 25 %
Lymphs Abs: 2.2 10*3/uL (ref 0.7–4.0)
MCH: 28.6 pg (ref 26.0–34.0)
MCHC: 32 g/dL (ref 30.0–36.0)
MCV: 89.5 fL (ref 78.0–100.0)
Monocytes Absolute: 0.8 10*3/uL (ref 0.1–1.0)
Monocytes Relative: 9 %
Neutro Abs: 5.3 10*3/uL (ref 1.7–7.7)
Neutrophils Relative %: 61 %
Platelets: 343 10*3/uL (ref 150–400)
RBC: 3.81 MIL/uL — ABNORMAL LOW (ref 3.87–5.11)
RDW: 14.7 % (ref 11.5–15.5)
WBC: 8.6 10*3/uL (ref 4.0–10.5)

## 2017-02-24 LAB — BASIC METABOLIC PANEL
Anion gap: 9 (ref 5–15)
BUN: 17 mg/dL (ref 6–20)
CO2: 23 mmol/L (ref 22–32)
Calcium: 9.7 mg/dL (ref 8.9–10.3)
Chloride: 104 mmol/L (ref 101–111)
Creatinine, Ser: 0.81 mg/dL (ref 0.44–1.00)
GFR calc Af Amer: >60 mL/min (ref >60)
GFR calc non Af Amer: >60 mL/min (ref >60)
Glucose, Bld: 127 mg/dL — ABNORMAL HIGH (ref 65–99)
Potassium: 4.2 mmol/L (ref 3.5–5.1)
Sodium: 136 mmol/L (ref 135–145)

## 2017-02-24 MED ORDER — HEPARIN SOD (PORK) LOCK FLUSH 100 UNIT/ML IV SOLN
250.0000 [IU] | INTRAVENOUS | Status: DC | PRN
  Administered 2017-02-24: 250 [IU]
  Filled 2017-02-24: qty 5

## 2017-02-24 MED ORDER — SODIUM CHLORIDE 0.9% FLUSH
10.0000 mL | INTRAVENOUS | Status: DC | PRN
  Administered 2017-02-24: 10 mL via INTRAVENOUS
  Filled 2017-02-24: qty 10

## 2017-03-02 ENCOUNTER — Encounter: Payer: Self-pay | Admitting: Internal Medicine

## 2017-03-02 ENCOUNTER — Ambulatory Visit (INDEPENDENT_AMBULATORY_CARE_PROVIDER_SITE_OTHER): Payer: Medicare Other | Admitting: Internal Medicine

## 2017-03-02 VITALS — BP 157/88 | HR 74 | Temp 98.3°F | Ht 64.5 in | Wt 217.0 lb

## 2017-03-02 DIAGNOSIS — B373 Candidiasis of vulva and vagina: Secondary | ICD-10-CM | POA: Diagnosis not present

## 2017-03-02 DIAGNOSIS — Z96651 Presence of right artificial knee joint: Secondary | ICD-10-CM | POA: Diagnosis not present

## 2017-03-02 DIAGNOSIS — T8453XD Infection and inflammatory reaction due to internal right knee prosthesis, subsequent encounter: Secondary | ICD-10-CM

## 2017-03-02 DIAGNOSIS — Z95828 Presence of other vascular implants and grafts: Secondary | ICD-10-CM | POA: Diagnosis not present

## 2017-03-02 DIAGNOSIS — B3731 Acute candidiasis of vulva and vagina: Secondary | ICD-10-CM | POA: Insufficient documentation

## 2017-03-02 LAB — BASIC METABOLIC PANEL
BUN: 16 mg/dL (ref 7–25)
CO2: 25 mmol/L (ref 20–31)
Calcium: 10.2 mg/dL (ref 8.6–10.4)
Chloride: 101 mmol/L (ref 98–110)
Creat: 0.93 mg/dL (ref 0.50–0.99)
Glucose, Bld: 120 mg/dL — ABNORMAL HIGH (ref 65–99)
Potassium: 5.4 mmol/L — ABNORMAL HIGH (ref 3.5–5.3)
Sodium: 138 mmol/L (ref 135–146)

## 2017-03-02 LAB — CBC WITH DIFFERENTIAL/PLATELET
Basophils Absolute: 83 cells/uL (ref 0–200)
Basophils Relative: 1 %
Eosinophils Absolute: 332 cells/uL (ref 15–500)
Eosinophils Relative: 4 %
HCT: 37.2 % (ref 35.0–45.0)
Hemoglobin: 11.9 g/dL (ref 11.7–15.5)
Lymphocytes Relative: 25 %
Lymphs Abs: 2075 cells/uL (ref 850–3900)
MCH: 28.6 pg (ref 27.0–33.0)
MCHC: 32 g/dL (ref 32.0–36.0)
MCV: 89.4 fL (ref 80.0–100.0)
MPV: 8.4 fL (ref 7.5–12.5)
Monocytes Absolute: 830 cells/uL (ref 200–950)
Monocytes Relative: 10 %
Neutro Abs: 4980 cells/uL (ref 1500–7800)
Neutrophils Relative %: 60 %
Platelets: 433 10*3/uL — ABNORMAL HIGH (ref 140–400)
RBC: 4.16 MIL/uL (ref 3.80–5.10)
RDW: 14.6 % (ref 11.0–15.0)
WBC: 8.3 10*3/uL (ref 3.8–10.8)

## 2017-03-02 MED ORDER — FLUCONAZOLE 150 MG PO TABS: 150.0000 mg | ORAL_TABLET | Freq: Once | ORAL | 5 refills | Status: AC

## 2017-03-02 MED ORDER — AMOXICILLIN 500 MG PO CAPS: 500.0000 mg | ORAL_CAPSULE | Freq: Three times a day (TID) | ORAL | 5 refills | Status: DC

## 2017-03-02 NOTE — Assessment & Plan Note (Signed)
I will give her fluconazole x 1 dose with 5 refills for further episodes if needed

## 2017-03-02 NOTE — Assessment & Plan Note (Addendum)
Doing well clinically and by labs so will finish her course tomorrow and start her on amoxicillin x 6 months through about January 3rd 2019.   Will check her labs today [place] picc care appt tomorrow She will follow up in 4-6 weeks

## 2017-03-02 NOTE — Progress Notes (Signed)
   Subjective:    Patient ID: RITIKA HELLICKSON, female    DOB: Feb 16, 1951, 66 y.o.   MRN: [id number]  HPI Here for follow up of PJI of right knee. She underwent polyexchange on 5/24 and grew GBS and was placed on ceftriaxone 2 grams daily for 6 weeks through July 5th.  She has had no associated n/v/d.  No rashes.  She did develop a yeast infection vaginally and used monostat but has had several episodes. This is a new problem since we saw her last. No fever, no chills.  Some knee swelling but no significant warmth.  Pain has improved.   Most recent ESR is 35 and CRP 6.1 mg/l which are both improved.     Review of Systems  Constitutional: Negative for fatigue and fever.  Genitourinary: Positive for vaginal discharge.  Skin: Negative for rash.  Neurological: Negative for dizziness.       Objective:   Physical Exam  Constitutional: She appears well-developed and well-nourished. No distress.  Eyes: No scleral icterus.  Cardiovascular: Normal rate, regular rhythm and normal heart sounds.   No murmur heard. Pulmonary/Chest: Effort normal and breath sounds normal. No respiratory distress.  Lymphadenopathy:    She has no cervical adenopathy.  Skin: No rash noted.    SH: no tobacco      Assessment & Plan:

## 2017-03-02 NOTE — Assessment & Plan Note (Signed)
She can have this removed tomorrow, 7/3 after her infusion and start amoxicillin 500 mg tid (already sent to pharmacy) on 7/4 for 6 months.

## 2017-03-03 ENCOUNTER — Inpatient Hospital Stay: Payer: Medicare Other | Admitting: Internal Medicine

## 2017-03-03 ENCOUNTER — Ambulatory Visit (INDEPENDENT_AMBULATORY_CARE_PROVIDER_SITE_OTHER): Payer: Medicare Other | Admitting: *Deleted

## 2017-03-03 ENCOUNTER — Ambulatory Visit (HOSPITAL_COMMUNITY): Admission: RE | Admit: 2017-03-03 | Payer: Medicare Other | Source: Ambulatory Visit

## 2017-03-03 DIAGNOSIS — Z96651 Presence of right artificial knee joint: Secondary | ICD-10-CM

## 2017-03-03 DIAGNOSIS — T8453XD Infection and inflammatory reaction due to internal right knee prosthesis, subsequent encounter: Secondary | ICD-10-CM

## 2017-03-03 LAB — C-REACTIVE PROTEIN: CRP: 11.4 mg/L — ABNORMAL HIGH (ref <8.0)

## 2017-03-03 LAB — SEDIMENTATION RATE: Sed Rate: 10 mm/hr (ref 0–30)

## 2017-03-03 NOTE — Progress Notes (Signed)
PICC removal per Dr. Linus Salmons.  RN received order to discontinue the patient's PICC line.  Patient identified with name and date of birth. PICC dressing removed, site unremarkable.  PICC line removed using sterile procedure @ 1100. PICC length equal to that noted in patient's hospital chart of 40 cm. Sterile petroleum gauze + sterile 4X4 applied to PICC site, pressure applied for 10 minutes and covered with Medipore tape as a pressure dressing. Patient tolerated procedure without complaints.  Patient instructed to limit use of arm for 1 hour. Patient instructed that the pressure dressing should remain in place for 24 hours. Patient verbalized understanding of these instructions.

## 2017-03-03 NOTE — Patient Instructions (Signed)
Patient instructed to limit use of arm for 1 hour. Patient instructed that the pressure dressing should remain in place for 24 hours. Patient verbalized understanding of these instructions.

## 2017-03-17 DIAGNOSIS — M67861 Other specified disorders of synovium, right knee: Secondary | ICD-10-CM | POA: Diagnosis not present

## 2017-04-07 DIAGNOSIS — M25561 Pain in right knee: Secondary | ICD-10-CM | POA: Diagnosis not present

## 2017-04-08 DIAGNOSIS — E039 Hypothyroidism, unspecified: Secondary | ICD-10-CM | POA: Diagnosis not present

## 2017-04-08 DIAGNOSIS — Z1231 Encounter for screening mammogram for malignant neoplasm of breast: Secondary | ICD-10-CM | POA: Diagnosis not present

## 2017-04-13 ENCOUNTER — Telehealth: Payer: Self-pay | Admitting: Cardiology

## 2017-04-13 NOTE — Telephone Encounter (Signed)
Called patient about her symptoms. Patient complaining of SOB with activity and rest. Patient stated this started after her knee replacement surgery.  Patient stated she is having some chest tightness at the base of her neck as well. Patient got an infection after her knee replacement and now is seeing infectious disease doctor, and she is taking antibiotics. With patient's recent surgery and complications, suggested patient to call infectious disease doctor about her symptoms. Patient stated she will see them tomorrow. Encouraged patient to keep her appointment with them and a message would be sent to [facility] for further advisement since [name] is out for a week. Patient has appointment with Brittainy in September and has seen her in the past. Patient verbalized understanding and agreed to plan.

## 2017-04-13 NOTE — Telephone Encounter (Signed)
New message      Pt c/o Shortness Of Breath: STAT if SOB developed within the last 24 hours or pt is noticeably SOB on the phone  1. Are you currently SOB (can you hear that pt is SOB on the phone)?  yes  2. How long have you been experiencing SOB?  Pt got an infection in her knee replacement in may---sob got worse since then. Pt is taking amoxicillian 500mg  tid for 6 months. 3. Are you SOB when sitting or when up moving around?  Moving around is worse, but she is sob sitting 4. Are you currently experiencing any other symptoms?  Lightheadedness and occasional chest tightness

## 2017-04-14 ENCOUNTER — Ambulatory Visit
Admission: RE | Admit: 2017-04-14 | Discharge: 2017-04-14 | Disposition: A | Discharge status: Home / Self Care | Payer: Medicare Other | Source: Ambulatory Visit | Attending: Internal Medicine | Admitting: Internal Medicine

## 2017-04-14 ENCOUNTER — Ambulatory Visit (INDEPENDENT_AMBULATORY_CARE_PROVIDER_SITE_OTHER): Payer: Medicare Other | Admitting: Internal Medicine

## 2017-04-14 ENCOUNTER — Encounter: Payer: Self-pay | Admitting: Internal Medicine

## 2017-04-14 VITALS — BP 148/86 | HR 77 | Temp 98.8°F | Ht 65.0 in | Wt 217.0 lb

## 2017-04-14 DIAGNOSIS — R0600 Dyspnea, unspecified: Secondary | ICD-10-CM

## 2017-04-14 DIAGNOSIS — R0609 Other forms of dyspnea: Secondary | ICD-10-CM

## 2017-04-14 DIAGNOSIS — B3731 Acute candidiasis of vulva and vagina: Secondary | ICD-10-CM

## 2017-04-14 DIAGNOSIS — Z96651 Presence of right artificial knee joint: Secondary | ICD-10-CM

## 2017-04-14 DIAGNOSIS — T8453XD Infection and inflammatory reaction due to internal right knee prosthesis, subsequent encounter: Secondary | ICD-10-CM | POA: Diagnosis not present

## 2017-04-14 DIAGNOSIS — Z95828 Presence of other vascular implants and grafts: Secondary | ICD-10-CM

## 2017-04-14 DIAGNOSIS — B373 Candidiasis of vulva and vagina: Secondary | ICD-10-CM

## 2017-04-14 DIAGNOSIS — R079 Chest pain, unspecified: Secondary | ICD-10-CM | POA: Diagnosis not present

## 2017-04-14 LAB — CBC WITH DIFFERENTIAL/PLATELET
Basophils Absolute: 0 cells/uL (ref 0–200)
Basophils Relative: 0 %
Eosinophils Absolute: 294 cells/uL (ref 15–500)
Eosinophils Relative: 3 %
HCT: 38 % (ref 35.0–45.0)
Hemoglobin: 12.2 g/dL (ref 11.7–15.5)
Lymphocytes Relative: 29 %
Lymphs Abs: 2842 cells/uL (ref 850–3900)
MCH: 28.6 pg (ref 27.0–33.0)
MCHC: 32.1 g/dL (ref 32.0–36.0)
MCV: 89 fL (ref 80.0–100.0)
MPV: 8.7 fL (ref 7.5–12.5)
Monocytes Absolute: 882 cells/uL (ref 200–950)
Monocytes Relative: 9 %
Neutro Abs: 5782 cells/uL (ref 1500–7800)
Neutrophils Relative %: 59 %
Platelets: 430 10*3/uL — ABNORMAL HIGH (ref 140–400)
RBC: 4.27 MIL/uL (ref 3.80–5.10)
RDW: 14.4 % (ref 11.0–15.0)
WBC: 9.8 10*3/uL (ref 3.8–10.8)

## 2017-04-14 NOTE — Assessment & Plan Note (Signed)
I will check a CXR and see if there is some concern for an underlying lung disease.  She sees cardiology soon and can consider cardiac work up, ? CHF.

## 2017-04-14 NOTE — Assessment & Plan Note (Signed)
Now removed 

## 2017-04-14 NOTE — Assessment & Plan Note (Signed)
Has refills of fluconazole as needed

## 2017-04-14 NOTE — Assessment & Plan Note (Signed)
Doing well.  I will check esr and crp today and continue with amoxicillin rtc 2 months 

## 2017-04-14 NOTE — Progress Notes (Signed)
   Subjective:    [name], female    DOB: 01-May-1951, 66 y.o.   MRN: [id number]  HPI Here for follow up for GBS PJI of right knee.   She completed 6 weeks of IV therapy and has been on amoxicillin.  No new issues, no significant knee swelling, no warmth.  No associated rash, diarrhea.  Has had yeast infections but none recently.  She has had some sob/doe for about 1 year and it was suggested to her that it could be the antibiotics.  She has no wheezing, gets SOB with activity.  No known heart issues but has not had an echo in the past.  No leg swelling, no orthopnea.     Review of Systems  Constitutional: Negative for chills, fatigue and fever.  Respiratory: Positive for shortness of breath. Negative for cough, chest tightness, wheezing and stridor.   Cardiovascular: Negative for palpitations and leg swelling.       Objective:   Physical Exam  Constitutional: She appears well-developed and well-nourished. No distress.  Eyes: No scleral icterus.  Cardiovascular: Normal rate, regular rhythm and normal heart sounds.   No murmur heard. Pulmonary/Chest: Effort normal and breath sounds normal. No respiratory distress. She has no wheezes. She has no rales.  Lymphadenopathy:    She has no cervical adenopathy.  Skin: No rash noted.   SH: no tobacco      Assessment & Plan:

## 2017-04-15 ENCOUNTER — Encounter: Payer: Self-pay | Admitting: Cardiology

## 2017-04-15 LAB — BASIC METABOLIC PANEL WITH GFR
BUN: 18 mg/dL (ref 7–25)
CO2: 23 mmol/L (ref 20–32)
Calcium: 9.8 mg/dL (ref 8.6–10.4)
Chloride: 105 mmol/L (ref 98–110)
Creat: 0.89 mg/dL (ref 0.50–0.99)
GFR, Est African American: 78 mL/min (ref >=60)
GFR, Est Non African American: 67 mL/min (ref >=60)
Glucose, Bld: 104 mg/dL — ABNORMAL HIGH (ref 65–99)
Potassium: 5.1 mmol/L (ref 3.5–5.3)
Sodium: 140 mmol/L (ref 135–146)

## 2017-04-15 LAB — SEDIMENTATION RATE: Sed Rate: 4 mm/hr (ref 0–30)

## 2017-04-15 LAB — C-REACTIVE PROTEIN: CRP: 5.3 mg/L (ref <8.0)

## 2017-04-21 DIAGNOSIS — Z96651 Presence of right artificial knee joint: Secondary | ICD-10-CM | POA: Diagnosis not present

## 2017-04-21 DIAGNOSIS — Z471 Aftercare following joint replacement surgery: Secondary | ICD-10-CM | POA: Diagnosis not present

## 2017-05-06 ENCOUNTER — Ambulatory Visit: Payer: Medicare Other | Admitting: Cardiology

## 2017-06-16 ENCOUNTER — Encounter: Payer: Self-pay | Admitting: Internal Medicine

## 2017-06-16 ENCOUNTER — Ambulatory Visit (INDEPENDENT_AMBULATORY_CARE_PROVIDER_SITE_OTHER): Payer: Medicare Other | Admitting: Internal Medicine

## 2017-06-16 VITALS — BP 158/89 | HR 73 | Temp 98.5°F | Wt 214.0 lb

## 2017-06-16 DIAGNOSIS — Z5181 Encounter for therapeutic drug level monitoring: Secondary | ICD-10-CM | POA: Diagnosis not present

## 2017-06-16 DIAGNOSIS — T8453XD Infection and inflammatory reaction due to internal right knee prosthesis, subsequent encounter: Secondary | ICD-10-CM | POA: Diagnosis not present

## 2017-06-16 DIAGNOSIS — A491 Streptococcal infection, unspecified site: Secondary | ICD-10-CM

## 2017-06-17 DIAGNOSIS — Z5181 Encounter for therapeutic drug level monitoring: Secondary | ICD-10-CM | POA: Insufficient documentation

## 2017-06-17 LAB — COMPREHENSIVE METABOLIC PANEL
AG Ratio: 1.9 (calc) (ref 1.0–2.5)
ALT: 10 U/L (ref 6–29)
AST: 14 U/L (ref 10–35)
Albumin: 4.5 g/dL (ref 3.6–5.1)
Alkaline phosphatase (APISO): 70 U/L (ref 33–130)
BUN: 18 mg/dL (ref 7–25)
CO2: 25 mmol/L (ref 20–32)
Calcium: 10.4 mg/dL (ref 8.6–10.4)
Chloride: 106 mmol/L (ref 98–110)
Creat: 0.95 mg/dL (ref 0.50–0.99)
Globulin: 2.4 g/dL (calc) (ref 1.9–3.7)
Glucose, Bld: 120 mg/dL — ABNORMAL HIGH (ref 65–99)
Potassium: 4.5 mmol/L (ref 3.5–5.3)
Sodium: 140 mmol/L (ref 135–146)
Total Bilirubin: 0.4 mg/dL (ref 0.2–1.2)
Total Protein: 6.9 g/dL (ref 6.1–8.1)

## 2017-06-17 LAB — C-REACTIVE PROTEIN: CRP: 6.5 mg/L (ref <8.0)

## 2017-06-17 LAB — SEDIMENTATION RATE: Sed Rate: 11 mm/h (ref 0–30)

## 2017-06-17 NOTE — Assessment & Plan Note (Signed)
I will check a cmp on medication

## 2017-06-17 NOTE — Progress Notes (Signed)
   Subjective:    [name], female    DOB: 02-Mar-1951, 66 y.o.   MRN: [id number]  HPI Here for follow up for GBS PJI of right knee.   She completed 6 weeks of IV therapy and has been on amoxicillin, now in her 4th month.  No new issues, no significant knee swelling, no warmth.  Some increased pain though she also has been more active.  No associated rash, diarrhea. SOB no resolved. No associated yeast infections.      Review of Systems  Constitutional: Negative for chills and fever.  Respiratory: Negative for chest tightness and shortness of breath.   Gastrointestinal: Negative for diarrhea.  Skin: Negative for rash.       Objective:   Physical Exam  Constitutional: She appears well-developed and well-nourished. No distress.  Eyes: No scleral icterus.  Cardiovascular: Normal rate, regular rhythm and normal heart sounds.   No murmur heard. Pulmonary/Chest: Effort normal and breath sounds normal. No respiratory distress.  Lymphadenopathy:    She has no cervical adenopathy.  Skin: No rash noted.   SH: no tobacco      Assessment & Plan:

## 2017-06-17 NOTE — Assessment & Plan Note (Signed)
On amoxicillin for continuation therapy for 6 months total.  rtc 2 months

## 2017-06-17 NOTE — Assessment & Plan Note (Signed)
Inflammatory markers improved.  Will check again today and next visit.  If remain good, will stop after 6 months.

## 2017-06-20 DIAGNOSIS — Z23 Encounter for immunization: Secondary | ICD-10-CM | POA: Diagnosis not present

## 2017-06-22 DIAGNOSIS — M47816 Spondylosis without myelopathy or radiculopathy, lumbar region: Secondary | ICD-10-CM | POA: Diagnosis not present

## 2017-07-06 DIAGNOSIS — E785 Hyperlipidemia, unspecified: Secondary | ICD-10-CM | POA: Diagnosis not present

## 2017-07-06 DIAGNOSIS — E039 Hypothyroidism, unspecified: Secondary | ICD-10-CM | POA: Diagnosis not present

## 2017-07-06 DIAGNOSIS — I10 Essential (primary) hypertension: Secondary | ICD-10-CM | POA: Diagnosis not present

## 2017-07-06 DIAGNOSIS — E119 Type 2 diabetes mellitus without complications: Secondary | ICD-10-CM | POA: Diagnosis not present

## 2017-07-06 DIAGNOSIS — Z7984 Long term (current) use of oral hypoglycemic drugs: Secondary | ICD-10-CM | POA: Diagnosis not present

## 2017-07-06 DIAGNOSIS — K219 Gastro-esophageal reflux disease without esophagitis: Secondary | ICD-10-CM | POA: Diagnosis not present

## 2017-07-06 DIAGNOSIS — G4700 Insomnia, unspecified: Secondary | ICD-10-CM | POA: Diagnosis not present

## 2017-07-16 DIAGNOSIS — M1711 Unilateral primary osteoarthritis, right knee: Secondary | ICD-10-CM | POA: Diagnosis not present

## 2017-08-12 ENCOUNTER — Ambulatory Visit (INDEPENDENT_AMBULATORY_CARE_PROVIDER_SITE_OTHER): Payer: Medicare Other | Admitting: Internal Medicine

## 2017-08-12 ENCOUNTER — Encounter: Payer: Self-pay | Admitting: Internal Medicine

## 2017-08-12 VITALS — BP 142/87 | HR 76 | Temp 97.8°F | Ht 65.0 in | Wt 218.0 lb

## 2017-08-12 DIAGNOSIS — Z5181 Encounter for therapeutic drug level monitoring: Secondary | ICD-10-CM | POA: Diagnosis not present

## 2017-08-12 DIAGNOSIS — T8453XD Infection and inflammatory reaction due to internal right knee prosthesis, subsequent encounter: Secondary | ICD-10-CM | POA: Diagnosis not present

## 2017-08-12 NOTE — Assessment & Plan Note (Signed)
Stable at this time.  Will repeat ESR and CRP and if stable, will stop treatment and observe off of antibiotics.  She will call with any concerns.

## 2017-08-12 NOTE — Progress Notes (Signed)
   Subjective:    [name], female    DOB: 1950/12/10, 66 y.o.   MRN: [id number]  HPI Here for follow up for GBS PJI of right knee.   She completed 6 weeks of IV therapy and has been on amoxicillin, now completing her 6th month.  No new issues, no significant knee swelling, no warmth.  Continues to be more active.  No associated rash, diarrhea.     Review of Systems  Constitutional: Negative for chills and fever.  Respiratory: Negative for chest tightness and shortness of breath.   Gastrointestinal: Negative for diarrhea.  Skin: Negative for rash.       Objective:   Physical Exam  Constitutional: She appears well-developed and well-nourished. No distress.  Eyes: No scleral icterus.  Cardiovascular: Normal rate, regular rhythm and normal heart sounds.  No murmur heard. Pulmonary/Chest: Effort normal and breath sounds normal. No respiratory distress.  Lymphadenopathy:    She has no cervical adenopathy.  Skin: No rash noted.   SH: no tobacco      Assessment & Plan:

## 2017-08-12 NOTE — Assessment & Plan Note (Addendum)
Will check inflammatory markers on treatment No issues with rash, n/v/d

## 2017-08-13 LAB — C-REACTIVE PROTEIN: CRP: 6.6 mg/L (ref <8.0)

## 2017-08-13 LAB — SEDIMENTATION RATE: Sed Rate: 17 mm/h (ref 0–30)

## 2017-08-14 ENCOUNTER — Telehealth: Payer: Self-pay | Admitting: *Deleted

## 2017-08-14 NOTE — Telephone Encounter (Signed)
Relayed results to patient, questions answered to her satisfaction. Landis Gandy, RN

## 2017-08-14 NOTE — Telephone Encounter (Signed)
-----  Message from Thayer Headings, MD sent at 08/13/2017  8:37 AM EST ----- Please let her know her CRP and ESR look great, no concerns and to stop her antibiotic when she finishes what she has, as we discussed. thanks

## 2017-09-22 DIAGNOSIS — M47816 Spondylosis without myelopathy or radiculopathy, lumbar region: Secondary | ICD-10-CM | POA: Diagnosis not present

## 2017-09-22 DIAGNOSIS — Z6836 Body mass index (BMI) 36.0-36.9, adult: Secondary | ICD-10-CM | POA: Diagnosis not present

## 2017-09-30 DIAGNOSIS — Z96651 Presence of right artificial knee joint: Secondary | ICD-10-CM | POA: Diagnosis not present

## 2017-10-05 DIAGNOSIS — M8580 Other specified disorders of bone density and structure, unspecified site: Secondary | ICD-10-CM | POA: Diagnosis not present

## 2017-10-05 DIAGNOSIS — Z23 Encounter for immunization: Secondary | ICD-10-CM | POA: Diagnosis not present

## 2017-10-05 DIAGNOSIS — K219 Gastro-esophageal reflux disease without esophagitis: Secondary | ICD-10-CM | POA: Diagnosis not present

## 2017-10-05 DIAGNOSIS — E039 Hypothyroidism, unspecified: Secondary | ICD-10-CM | POA: Diagnosis not present

## 2017-10-05 DIAGNOSIS — I10 Essential (primary) hypertension: Secondary | ICD-10-CM | POA: Diagnosis not present

## 2017-10-05 DIAGNOSIS — Z0000 Encounter for general adult medical examination without abnormal findings: Secondary | ICD-10-CM | POA: Diagnosis not present

## 2017-10-05 DIAGNOSIS — R0602 Shortness of breath: Secondary | ICD-10-CM | POA: Diagnosis not present

## 2017-10-05 DIAGNOSIS — Z6838 Body mass index (BMI) 38.0-38.9, adult: Secondary | ICD-10-CM | POA: Diagnosis not present

## 2017-10-05 DIAGNOSIS — E119 Type 2 diabetes mellitus without complications: Secondary | ICD-10-CM | POA: Diagnosis not present

## 2017-10-05 DIAGNOSIS — Z1389 Encounter for screening for other disorder: Secondary | ICD-10-CM | POA: Diagnosis not present

## 2017-10-05 DIAGNOSIS — M1990 Unspecified osteoarthritis, unspecified site: Secondary | ICD-10-CM | POA: Diagnosis not present

## 2017-10-05 DIAGNOSIS — E785 Hyperlipidemia, unspecified: Secondary | ICD-10-CM | POA: Diagnosis not present

## 2017-10-20 DIAGNOSIS — M79662 Pain in left lower leg: Secondary | ICD-10-CM | POA: Diagnosis not present

## 2017-10-20 DIAGNOSIS — N390 Urinary tract infection, site not specified: Secondary | ICD-10-CM | POA: Diagnosis not present

## 2017-10-20 DIAGNOSIS — M79661 Pain in right lower leg: Secondary | ICD-10-CM | POA: Diagnosis not present

## 2017-10-20 DIAGNOSIS — Z96651 Presence of right artificial knee joint: Secondary | ICD-10-CM | POA: Diagnosis not present

## 2017-10-20 DIAGNOSIS — M79604 Pain in right leg: Secondary | ICD-10-CM | POA: Diagnosis not present

## 2017-10-20 DIAGNOSIS — M79605 Pain in left leg: Secondary | ICD-10-CM | POA: Diagnosis not present

## 2017-10-21 ENCOUNTER — Ambulatory Visit (HOSPITAL_COMMUNITY)
Admission: RE | Admit: 2017-10-21 | Discharge: 2017-10-21 | Disposition: A | Discharge status: Home / Self Care | Payer: Medicare Other | Source: Ambulatory Visit | Attending: Cardiovascular Disease | Admitting: Cardiovascular Disease

## 2017-10-21 ENCOUNTER — Other Ambulatory Visit (HOSPITAL_COMMUNITY): Payer: Self-pay | Admitting: Orthopedic Surgery

## 2017-10-21 DIAGNOSIS — M7989 Other specified soft tissue disorders: Secondary | ICD-10-CM | POA: Diagnosis not present

## 2017-10-21 DIAGNOSIS — M79661 Pain in right lower leg: Secondary | ICD-10-CM | POA: Diagnosis not present

## 2017-10-21 DIAGNOSIS — M79605 Pain in left leg: Secondary | ICD-10-CM | POA: Diagnosis not present

## 2017-10-21 DIAGNOSIS — M79662 Pain in left lower leg: Secondary | ICD-10-CM | POA: Diagnosis not present

## 2017-10-21 DIAGNOSIS — M79604 Pain in right leg: Secondary | ICD-10-CM | POA: Insufficient documentation

## 2017-10-23 DIAGNOSIS — M79604 Pain in right leg: Secondary | ICD-10-CM | POA: Diagnosis not present

## 2017-10-23 DIAGNOSIS — N390 Urinary tract infection, site not specified: Secondary | ICD-10-CM | POA: Diagnosis not present

## 2017-10-23 DIAGNOSIS — Z96651 Presence of right artificial knee joint: Secondary | ICD-10-CM | POA: Diagnosis not present

## 2017-10-23 DIAGNOSIS — M79661 Pain in right lower leg: Secondary | ICD-10-CM | POA: Diagnosis not present

## 2017-10-23 DIAGNOSIS — M79605 Pain in left leg: Secondary | ICD-10-CM | POA: Diagnosis not present

## 2017-10-23 DIAGNOSIS — M79662 Pain in left lower leg: Secondary | ICD-10-CM | POA: Diagnosis not present

## 2017-10-24 DIAGNOSIS — Z96651 Presence of right artificial knee joint: Secondary | ICD-10-CM | POA: Insufficient documentation

## 2017-10-27 DIAGNOSIS — Z96651 Presence of right artificial knee joint: Secondary | ICD-10-CM | POA: Diagnosis not present

## 2017-11-03 DIAGNOSIS — Z96651 Presence of right artificial knee joint: Secondary | ICD-10-CM | POA: Diagnosis not present

## 2017-11-03 DIAGNOSIS — M25561 Pain in right knee: Secondary | ICD-10-CM | POA: Diagnosis not present

## 2017-11-10 ENCOUNTER — Institutional Professional Consult (permissible substitution): Payer: Medicare Other | Admitting: Internal Medicine

## 2017-11-16 DIAGNOSIS — N390 Urinary tract infection, site not specified: Secondary | ICD-10-CM | POA: Diagnosis not present

## 2017-11-17 DIAGNOSIS — M25561 Pain in right knee: Secondary | ICD-10-CM | POA: Diagnosis not present

## 2017-11-17 DIAGNOSIS — M25562 Pain in left knee: Secondary | ICD-10-CM | POA: Diagnosis not present

## 2017-11-17 DIAGNOSIS — Z96651 Presence of right artificial knee joint: Secondary | ICD-10-CM | POA: Diagnosis not present

## 2017-11-18 DIAGNOSIS — Z96651 Presence of right artificial knee joint: Secondary | ICD-10-CM | POA: Diagnosis not present

## 2017-11-20 ENCOUNTER — Other Ambulatory Visit: Payer: Self-pay

## 2017-11-20 ENCOUNTER — Encounter (HOSPITAL_COMMUNITY): Payer: Self-pay | Admitting: *Deleted

## 2017-11-21 IMAGING — DX DG CHEST 2V
2 series · 2 of 2 positions shown · non-contrast
Comparison: None in PACs

CLINICAL DATA: Chronic dyspnea on exertion with mid chest and upper
back pain since knee surgery in December 2016. History of hypertension,
hypercholesterolemia, diabetes

EXAM:
CHEST  2 VIEW

[dg chest 2 view (1 of 2)]
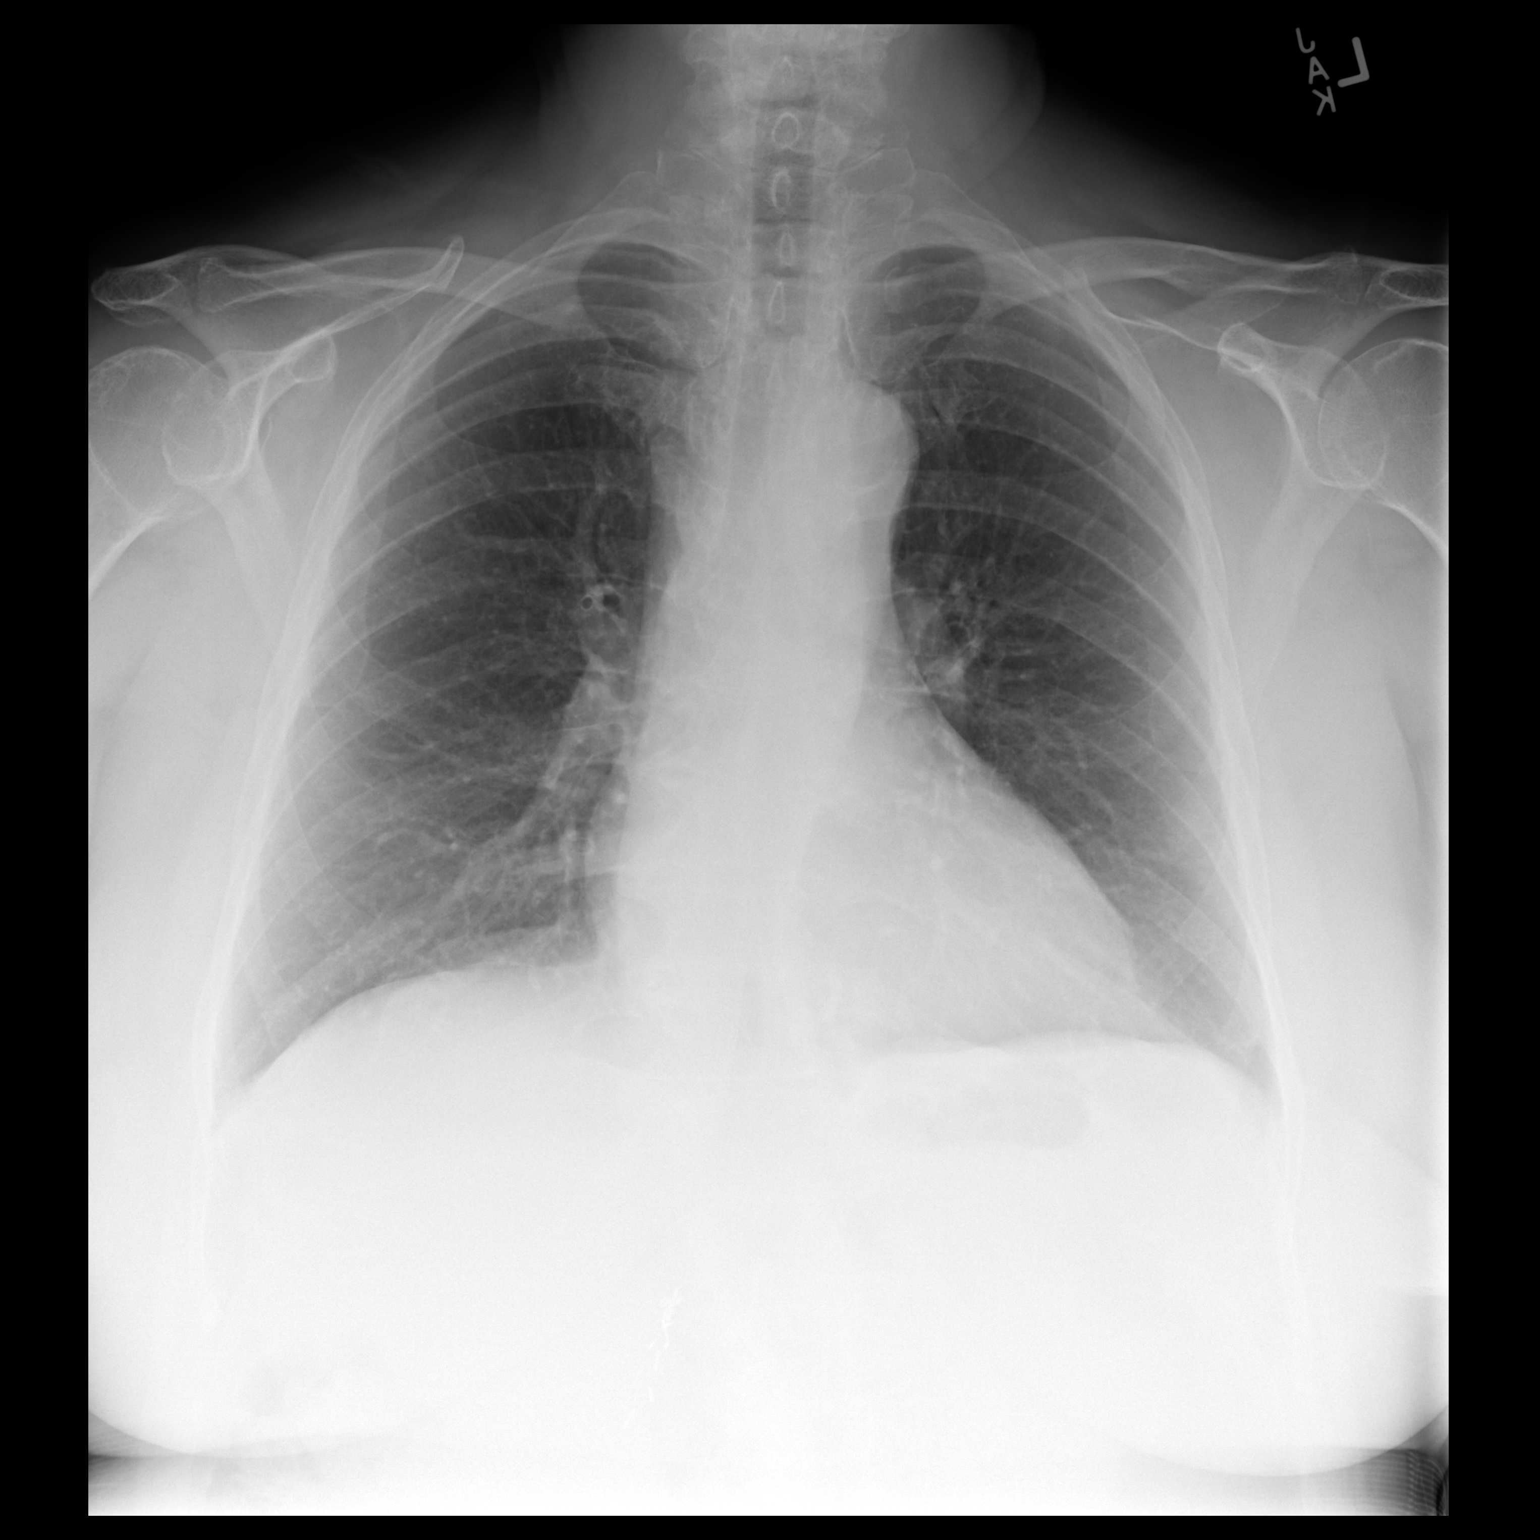

[dg chest 2 view (2 of 2)]
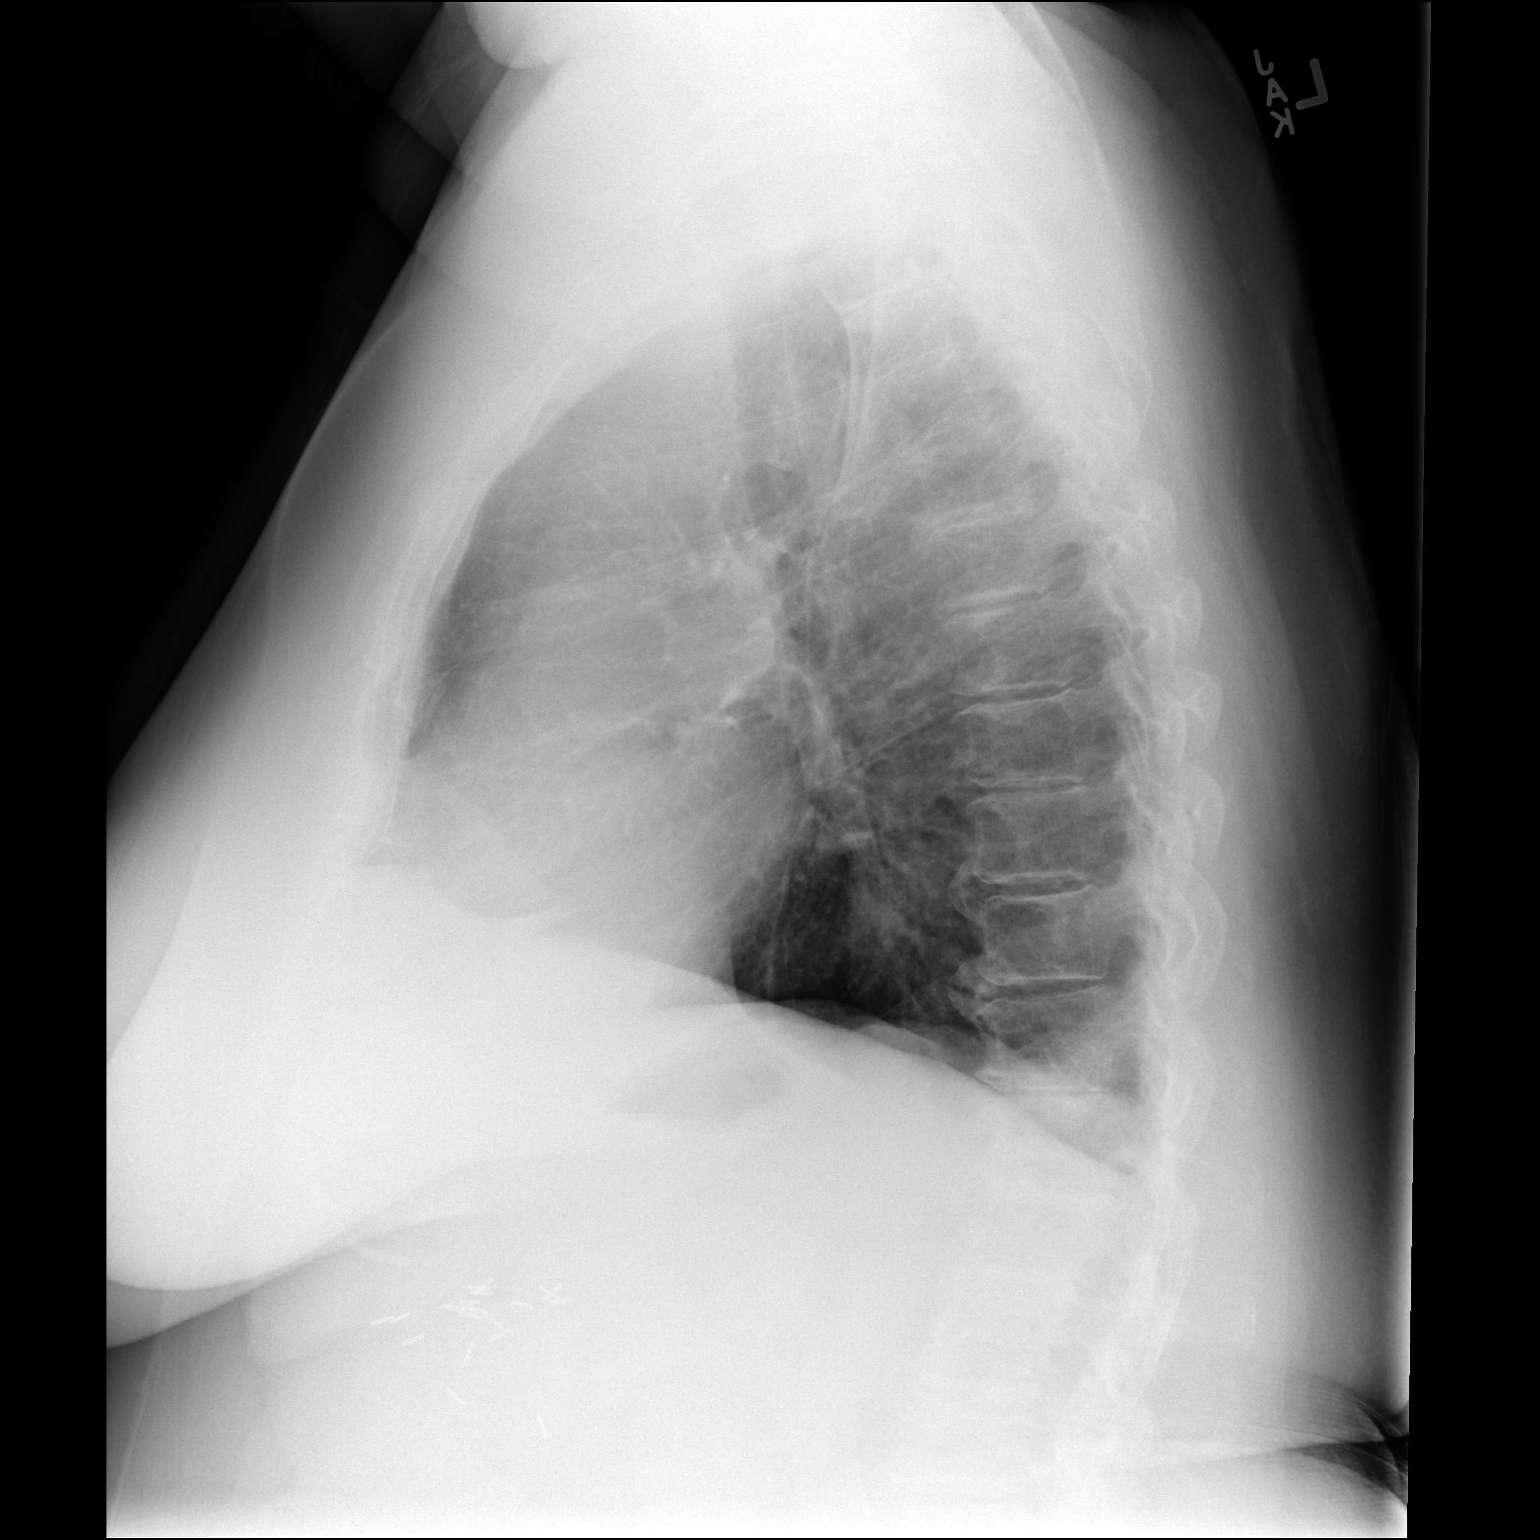

[2 of 2 positions shown; findings below may reference images not displayed]

FINDINGS: The lungs are adequately inflated. There is no focal infiltrate.
Density projecting over the lower thoracic spine on the lateral view
is felt reflect overlap of posterior ribs as well as degenerative
changes of the T11-T12 disc. The heart and pulmonary vascularity are
normal. There is calcification in the wall of the aortic arch. There
is tortuosity of the descending thoracic aorta. There is multilevel
degenerative disc disease of the thoracic spine.
IMPRESSION: There is no pneumonia, CHF, nor other acute cardiopulmonary
abnormality.

Thoracic aortic atherosclerosis.

## 2017-11-23 ENCOUNTER — Encounter (HOSPITAL_COMMUNITY): Payer: Self-pay | Admitting: *Deleted

## 2017-11-23 ENCOUNTER — Other Ambulatory Visit: Payer: Self-pay

## 2017-11-23 ENCOUNTER — Inpatient Hospital Stay (HOSPITAL_COMMUNITY): Payer: Medicare Other | Admitting: Anesthesiology

## 2017-11-23 ENCOUNTER — Inpatient Hospital Stay (HOSPITAL_COMMUNITY)
Admission: RE | Admit: 2017-11-23 | Discharge: 2017-11-25 | DRG: 468 | Disposition: A | Discharge status: Home Health | Payer: Medicare Other | Source: Ambulatory Visit | Attending: Orthopedic Surgery | Admitting: Orthopedic Surgery

## 2017-11-23 ENCOUNTER — Encounter (HOSPITAL_COMMUNITY)
Admission: RE | Disposition: A | Discharge status: Home Health | Payer: Self-pay | Source: Ambulatory Visit | Attending: Orthopedic Surgery

## 2017-11-23 DIAGNOSIS — Z96653 Presence of artificial knee joint, bilateral: Secondary | ICD-10-CM | POA: Diagnosis present

## 2017-11-23 DIAGNOSIS — I10 Essential (primary) hypertension: Secondary | ICD-10-CM | POA: Diagnosis not present

## 2017-11-23 DIAGNOSIS — Z87440 Personal history of urinary (tract) infections: Secondary | ICD-10-CM | POA: Diagnosis not present

## 2017-11-23 DIAGNOSIS — K589 Irritable bowel syndrome without diarrhea: Secondary | ICD-10-CM | POA: Diagnosis not present

## 2017-11-23 DIAGNOSIS — E119 Type 2 diabetes mellitus without complications: Secondary | ICD-10-CM | POA: Diagnosis not present

## 2017-11-23 DIAGNOSIS — Z9049 Acquired absence of other specified parts of digestive tract: Secondary | ICD-10-CM

## 2017-11-23 DIAGNOSIS — M009 Pyogenic arthritis, unspecified: Secondary | ICD-10-CM

## 2017-11-23 DIAGNOSIS — M00261 Other streptococcal arthritis, right knee: Secondary | ICD-10-CM | POA: Diagnosis not present

## 2017-11-23 DIAGNOSIS — J309 Allergic rhinitis, unspecified: Secondary | ICD-10-CM | POA: Diagnosis not present

## 2017-11-23 DIAGNOSIS — E039 Hypothyroidism, unspecified: Secondary | ICD-10-CM | POA: Diagnosis not present

## 2017-11-23 DIAGNOSIS — B951 Streptococcus, group B, as the cause of diseases classified elsewhere: Secondary | ICD-10-CM | POA: Diagnosis present

## 2017-11-23 DIAGNOSIS — Z96651 Presence of right artificial knee joint: Secondary | ICD-10-CM | POA: Diagnosis not present

## 2017-11-23 DIAGNOSIS — Z792 Long term (current) use of antibiotics: Secondary | ICD-10-CM | POA: Diagnosis not present

## 2017-11-23 DIAGNOSIS — E669 Obesity, unspecified: Secondary | ICD-10-CM | POA: Diagnosis not present

## 2017-11-23 DIAGNOSIS — M1990 Unspecified osteoarthritis, unspecified site: Secondary | ICD-10-CM | POA: Diagnosis not present

## 2017-11-23 DIAGNOSIS — Z6834 Body mass index (BMI) 34.0-34.9, adult: Secondary | ICD-10-CM | POA: Diagnosis not present

## 2017-11-23 DIAGNOSIS — Z79899 Other long term (current) drug therapy: Secondary | ICD-10-CM | POA: Diagnosis not present

## 2017-11-23 DIAGNOSIS — Y831 Surgical operation with implant of artificial internal device as the cause of abnormal reaction of the patient, or of later complication, without mention of misadventure at the time of the procedure: Secondary | ICD-10-CM | POA: Diagnosis present

## 2017-11-23 DIAGNOSIS — T8453XA Infection and inflammatory reaction due to internal right knee prosthesis, initial encounter: Principal | ICD-10-CM | POA: Diagnosis present

## 2017-11-23 DIAGNOSIS — Z95828 Presence of other vascular implants and grafts: Secondary | ICD-10-CM | POA: Diagnosis not present

## 2017-11-23 DIAGNOSIS — E785 Hyperlipidemia, unspecified: Secondary | ICD-10-CM | POA: Diagnosis not present

## 2017-11-23 HISTORY — DX: Other specified postprocedural states: Z98.890

## 2017-11-23 HISTORY — DX: Nausea with vomiting, unspecified: R11.2

## 2017-11-23 HISTORY — PX: EXCISIONAL TOTAL KNEE ARTHROPLASTY WITH ANTIBIOTIC SPACERS: SHX5827

## 2017-11-23 LAB — HEMOGLOBIN A1C
Hgb A1c MFr Bld: 6.1 % — ABNORMAL HIGH (ref 4.8–5.6)
Mean Plasma Glucose: 128.37 mg/dL

## 2017-11-23 LAB — BASIC METABOLIC PANEL
Anion gap: 10 (ref 5–15)
BUN: 13 mg/dL (ref 6–20)
CO2: 26 mmol/L (ref 22–32)
Calcium: 9.8 mg/dL (ref 8.9–10.3)
Chloride: 103 mmol/L (ref 101–111)
Creatinine, Ser: 0.77 mg/dL (ref 0.44–1.00)
GFR calc Af Amer: >60 mL/min (ref >60)
GFR calc non Af Amer: >60 mL/min (ref >60)
Glucose, Bld: 113 mg/dL — ABNORMAL HIGH (ref 65–99)
Potassium: 4.3 mmol/L (ref 3.5–5.1)
Sodium: 139 mmol/L (ref 135–145)

## 2017-11-23 LAB — GLUCOSE, CAPILLARY
Glucose-Capillary: 113 mg/dL — ABNORMAL HIGH (ref 65–99)
Glucose-Capillary: 102 mg/dL — ABNORMAL HIGH (ref 65–99)

## 2017-11-23 LAB — TYPE AND SCREEN
ABO/RH(D): A POS
Antibody Screen: NEGATIVE

## 2017-11-23 LAB — SURGICAL PCR SCREEN
MRSA, PCR: NEGATIVE
Staphylococcus aureus: NEGATIVE

## 2017-11-23 SURGERY — REMOVAL, TOTAL ARTHROPLASTY HARDWARE, KNEE, WITH ANTIBIOTIC SPACER INSERTION
Anesthesia: Spinal | Site: Knee | Laterality: Right

## 2017-11-23 MED ORDER — FENTANYL CITRATE (PF) 100 MCG/2ML IJ SOLN
50.0000 ug | Freq: Once | INTRAMUSCULAR | Status: AC
  Administered 2017-11-23: 50 ug via INTRAVENOUS

## 2017-11-23 MED ORDER — HYDROMORPHONE HCL 1 MG/ML IJ SOLN
0.5000 mg | INTRAMUSCULAR | Status: DC | PRN
  Administered 2017-11-23 – 2017-11-24 (×3): 1 mg via INTRAVENOUS
  Filled 2017-11-23 (×3): qty 1

## 2017-11-23 MED ORDER — LEVOTHYROXINE SODIUM 112 MCG PO TABS
112.0000 ug | ORAL_TABLET | Freq: Every day | ORAL | Status: DC
  Administered 2017-11-24 – 2017-11-25 (×2): 112 ug via ORAL
  Filled 2017-11-23 (×3): qty 1

## 2017-11-23 MED ORDER — PROMETHAZINE HCL 25 MG/ML IJ SOLN: 6.2500 mg | INTRAMUSCULAR | Status: DC | PRN

## 2017-11-23 MED ORDER — LACTATED RINGERS IV SOLN
INTRAVENOUS | Status: DC
  Administered 2017-11-23: 16:00:00 via INTRAVENOUS

## 2017-11-23 MED ORDER — DEXAMETHASONE SODIUM PHOSPHATE 10 MG/ML IJ SOLN
INTRAMUSCULAR | Status: DC | PRN
  Administered 2017-11-23: 10 mg via INTRAVENOUS

## 2017-11-23 MED ORDER — HYDROMORPHONE HCL 2 MG/ML IJ SOLN
INTRAMUSCULAR | Status: AC
  Filled 2017-11-23: qty 1

## 2017-11-23 MED ORDER — HYDROMORPHONE HCL 1 MG/ML IJ SOLN
INTRAMUSCULAR | Status: AC
  Administered 2017-11-23: 0.5 mg via INTRAVENOUS
  Filled 2017-11-23: qty 1

## 2017-11-23 MED ORDER — ACETAMINOPHEN 10 MG/ML IV SOLN
1000.0000 mg | Freq: Once | INTRAVENOUS | Status: AC
  Administered 2017-11-23: 1000 mg via INTRAVENOUS

## 2017-11-23 MED ORDER — CEFAZOLIN SODIUM-DEXTROSE 2-4 GM/100ML-% IV SOLN
2.0000 g | Freq: Three times a day (TID) | INTRAVENOUS | Status: DC
  Administered 2017-11-23 – 2017-11-24 (×3): 2 g via INTRAVENOUS
  Filled 2017-11-23 (×3): qty 100

## 2017-11-23 MED ORDER — OXYCODONE HCL 5 MG PO TABS
10.0000 mg | ORAL_TABLET | ORAL | Status: DC | PRN
  Administered 2017-11-24 – 2017-11-25 (×8): 15 mg via ORAL
  Filled 2017-11-23 (×8): qty 3

## 2017-11-23 MED ORDER — PROPOFOL 10 MG/ML IV BOLUS
INTRAVENOUS | Status: DC | PRN
  Administered 2017-11-23: 200 mg via INTRAVENOUS

## 2017-11-23 MED ORDER — SODIUM CHLORIDE 0.9 % IV SOLN
INTRAVENOUS | Status: DC
  Administered 2017-11-23: 21:00:00 via INTRAVENOUS

## 2017-11-23 MED ORDER — FLEET ENEMA 7-19 GM/118ML RE ENEM: 1.0000 | ENEMA | Freq: Once | RECTAL | Status: DC | PRN

## 2017-11-23 MED ORDER — MENTHOL 3 MG MT LOZG: 1.0000 | LOZENGE | OROMUCOSAL | Status: DC | PRN

## 2017-11-23 MED ORDER — CHLORHEXIDINE GLUCONATE 4 % EX LIQD: 60.0000 mL | Freq: Once | CUTANEOUS | Status: DC

## 2017-11-23 MED ORDER — SODIUM CHLORIDE 0.9 % IJ SOLN
INTRAMUSCULAR | Status: AC
  Filled 2017-11-23: qty 10

## 2017-11-23 MED ORDER — ONDANSETRON HCL 4 MG/2ML IJ SOLN
4.0000 mg | Freq: Four times a day (QID) | INTRAMUSCULAR | Status: DC | PRN
  Administered 2017-11-24: 4 mg via INTRAVENOUS
  Filled 2017-11-23: qty 2

## 2017-11-23 MED ORDER — PHENOL 1.4 % MT LIQD: 1.0000 | OROMUCOSAL | Status: DC | PRN

## 2017-11-23 MED ORDER — ONDANSETRON HCL 4 MG PO TABS
4.0000 mg | ORAL_TABLET | Freq: Four times a day (QID) | ORAL | Status: DC | PRN
  Administered 2017-11-23 – 2017-11-24 (×2): 4 mg via ORAL
  Filled 2017-11-23 (×2): qty 1

## 2017-11-23 MED ORDER — DIPHENHYDRAMINE HCL 12.5 MG/5ML PO ELIX: 12.5000 mg | ORAL_SOLUTION | ORAL | Status: DC | PRN

## 2017-11-23 MED ORDER — ACETAMINOPHEN 10 MG/ML IV SOLN
INTRAVENOUS | Status: AC
  Filled 2017-11-23: qty 100

## 2017-11-23 MED ORDER — HYDROMORPHONE HCL 1 MG/ML IJ SOLN
INTRAMUSCULAR | Status: DC | PRN
  Administered 2017-11-23 (×2): 0.5 mg via INTRAVENOUS

## 2017-11-23 MED ORDER — SODIUM CHLORIDE 0.9 % IR SOLN
Status: DC | PRN
  Administered 2017-11-23: 2000 mL

## 2017-11-23 MED ORDER — POVIDONE-IODINE 10 % EX SWAB
2.0000 "application " | Freq: Once | CUTANEOUS | Status: AC
  Administered 2017-11-23: 2 via TOPICAL

## 2017-11-23 MED ORDER — BISACODYL 10 MG RE SUPP: 10.0000 mg | Freq: Every day | RECTAL | Status: DC | PRN

## 2017-11-23 MED ORDER — SODIUM CHLORIDE 0.9 % IJ SOLN
INTRAMUSCULAR | Status: AC
  Filled 2017-11-23: qty 50

## 2017-11-23 MED ORDER — DOCUSATE SODIUM 100 MG PO CAPS
100.0000 mg | ORAL_CAPSULE | Freq: Two times a day (BID) | ORAL | Status: DC
  Administered 2017-11-23 – 2017-11-25 (×4): 100 mg via ORAL
  Filled 2017-11-23 (×4): qty 1

## 2017-11-23 MED ORDER — PANTOPRAZOLE SODIUM 40 MG PO TBEC
40.0000 mg | DELAYED_RELEASE_TABLET | Freq: Every day | ORAL | Status: DC
  Administered 2017-11-24 – 2017-11-25 (×2): 40 mg via ORAL
  Filled 2017-11-23 (×2): qty 1

## 2017-11-23 MED ORDER — HYDROMORPHONE HCL 1 MG/ML IJ SOLN
0.2500 mg | INTRAMUSCULAR | Status: DC | PRN
  Administered 2017-11-23 (×4): 0.5 mg via INTRAVENOUS

## 2017-11-23 MED ORDER — SODIUM CHLORIDE 0.9 % IR SOLN
Status: DC | PRN
  Administered 2017-11-23: 1000 mL

## 2017-11-23 MED ORDER — ACETAMINOPHEN 500 MG PO TABS
1000.0000 mg | ORAL_TABLET | Freq: Four times a day (QID) | ORAL | Status: AC
  Administered 2017-11-23 – 2017-11-24 (×4): 1000 mg via ORAL
  Filled 2017-11-23 (×4): qty 2

## 2017-11-23 MED ORDER — MIDAZOLAM HCL 2 MG/2ML IJ SOLN
INTRAMUSCULAR | Status: AC
  Administered 2017-11-23: 1 mg via INTRAVENOUS
  Filled 2017-11-23: qty 2

## 2017-11-23 MED ORDER — HYDROMORPHONE HCL 1 MG/ML IJ SOLN
0.2500 mg | INTRAMUSCULAR | Status: DC | PRN
  Administered 2017-11-23 (×2): 0.5 mg via INTRAVENOUS

## 2017-11-23 MED ORDER — METHOCARBAMOL 1000 MG/10ML IJ SOLN
500.0000 mg | Freq: Four times a day (QID) | INTRAVENOUS | Status: DC | PRN
  Administered 2017-11-23: 500 mg via INTRAVENOUS
  Filled 2017-11-23: qty 550

## 2017-11-23 MED ORDER — OXYCODONE HCL 5 MG PO TABS
5.0000 mg | ORAL_TABLET | ORAL | Status: DC | PRN
  Administered 2017-11-23: 5 mg via ORAL
  Administered 2017-11-24: 10 mg via ORAL
  Filled 2017-11-23: qty 1
  Filled 2017-11-23: qty 2

## 2017-11-23 MED ORDER — METOCLOPRAMIDE HCL 5 MG PO TABS
5.0000 mg | ORAL_TABLET | Freq: Three times a day (TID) | ORAL | Status: DC | PRN
  Administered 2017-11-25: 10 mg via ORAL
  Filled 2017-11-23: qty 2

## 2017-11-23 MED ORDER — FENTANYL CITRATE (PF) 100 MCG/2ML IJ SOLN
INTRAMUSCULAR | Status: DC | PRN
  Administered 2017-11-23: 25 ug via INTRAVENOUS
  Administered 2017-11-23 (×2): 50 ug via INTRAVENOUS
  Administered 2017-11-23: 25 ug via INTRAVENOUS
  Administered 2017-11-23 (×3): 50 ug via INTRAVENOUS

## 2017-11-23 MED ORDER — FENTANYL CITRATE (PF) 100 MCG/2ML IJ SOLN
INTRAMUSCULAR | Status: AC
  Filled 2017-11-23: qty 2

## 2017-11-23 MED ORDER — MIDAZOLAM HCL 2 MG/2ML IJ SOLN
INTRAMUSCULAR | Status: DC | PRN
  Administered 2017-11-23: 2 mg via INTRAVENOUS

## 2017-11-23 MED ORDER — TRAMADOL HCL 50 MG PO TABS
50.0000 mg | ORAL_TABLET | Freq: Four times a day (QID) | ORAL | Status: DC | PRN
  Administered 2017-11-24: 100 mg via ORAL
  Filled 2017-11-23: qty 2

## 2017-11-23 MED ORDER — SODIUM CHLORIDE 0.9 % IV SOLN: INTRAVENOUS | Status: DC

## 2017-11-23 MED ORDER — PROPOFOL 10 MG/ML IV BOLUS
INTRAVENOUS | Status: AC
  Filled 2017-11-23: qty 40

## 2017-11-23 MED ORDER — PHENYLEPHRINE 40 MCG/ML (10ML) SYRINGE FOR IV PUSH (FOR BLOOD PRESSURE SUPPORT)
PREFILLED_SYRINGE | INTRAVENOUS | Status: AC
  Filled 2017-11-23: qty 20

## 2017-11-23 MED ORDER — RIVAROXABAN 10 MG PO TABS
10.0000 mg | ORAL_TABLET | Freq: Every day | ORAL | Status: DC
  Administered 2017-11-24 – 2017-11-25 (×2): 10 mg via ORAL
  Filled 2017-11-23 (×2): qty 1

## 2017-11-23 MED ORDER — LIDOCAINE 2% (20 MG/ML) 5 ML SYRINGE
INTRAMUSCULAR | Status: DC | PRN
  Administered 2017-11-23: 60 mg via INTRAVENOUS

## 2017-11-23 MED ORDER — MIDAZOLAM HCL 2 MG/2ML IJ SOLN
INTRAMUSCULAR | Status: AC
  Filled 2017-11-23: qty 2

## 2017-11-23 MED ORDER — STERILE WATER FOR IRRIGATION IR SOLN
Status: DC | PRN
  Administered 2017-11-23: 2000 mL

## 2017-11-23 MED ORDER — FENTANYL CITRATE (PF) 100 MCG/2ML IJ SOLN
INTRAMUSCULAR | Status: AC
  Administered 2017-11-23: 50 ug via INTRAVENOUS
  Filled 2017-11-23: qty 2

## 2017-11-23 MED ORDER — TRANEXAMIC ACID 1000 MG/10ML IV SOLN
1000.0000 mg | INTRAVENOUS | Status: AC
  Administered 2017-11-23: 1000 mg via INTRAVENOUS
  Filled 2017-11-23: qty 1100

## 2017-11-23 MED ORDER — LINAGLIPTIN 5 MG PO TABS
5.0000 mg | ORAL_TABLET | Freq: Every day | ORAL | Status: DC
  Administered 2017-11-24 – 2017-11-25 (×2): 5 mg via ORAL
  Filled 2017-11-23 (×2): qty 1

## 2017-11-23 MED ORDER — LIDOCAINE 2% (20 MG/ML) 5 ML SYRINGE
INTRAMUSCULAR | Status: AC
  Filled 2017-11-23: qty 5

## 2017-11-23 MED ORDER — ONDANSETRON HCL 4 MG/2ML IJ SOLN
INTRAMUSCULAR | Status: DC | PRN
  Administered 2017-11-23: 4 mg via INTRAVENOUS

## 2017-11-23 MED ORDER — CEFAZOLIN SODIUM-DEXTROSE 2-4 GM/100ML-% IV SOLN
2.0000 g | INTRAVENOUS | Status: AC
  Administered 2017-11-23: 2 g via INTRAVENOUS
  Filled 2017-11-23: qty 100

## 2017-11-23 MED ORDER — CEFAZOLIN SODIUM-DEXTROSE 2-4 GM/100ML-% IV SOLN: 2.0000 g | Freq: Four times a day (QID) | INTRAVENOUS | Status: DC

## 2017-11-23 MED ORDER — MIDAZOLAM HCL 2 MG/2ML IJ SOLN
1.0000 mg | Freq: Once | INTRAMUSCULAR | Status: AC | PRN
  Administered 2017-11-23: 1 mg via INTRAVENOUS

## 2017-11-23 MED ORDER — METHOCARBAMOL 500 MG PO TABS
500.0000 mg | ORAL_TABLET | Freq: Four times a day (QID) | ORAL | Status: DC | PRN
  Administered 2017-11-24 – 2017-11-25 (×5): 500 mg via ORAL
  Filled 2017-11-23 (×5): qty 1

## 2017-11-23 MED ORDER — POLYETHYLENE GLYCOL 3350 17 G PO PACK: 17.0000 g | PACK | Freq: Every day | ORAL | Status: DC | PRN

## 2017-11-23 MED ORDER — BUPIVACAINE HCL (PF) 0.25 % IJ SOLN
INTRAMUSCULAR | Status: AC
  Filled 2017-11-23: qty 30

## 2017-11-23 MED ORDER — DEXAMETHASONE SODIUM PHOSPHATE 10 MG/ML IJ SOLN
10.0000 mg | Freq: Once | INTRAMUSCULAR | Status: AC
  Administered 2017-11-24: 10 mg via INTRAVENOUS
  Filled 2017-11-23: qty 1

## 2017-11-23 MED ORDER — DEXAMETHASONE SODIUM PHOSPHATE 10 MG/ML IJ SOLN
INTRAMUSCULAR | Status: AC
  Filled 2017-11-23: qty 1

## 2017-11-23 MED ORDER — METOCLOPRAMIDE HCL 5 MG/ML IJ SOLN
5.0000 mg | Freq: Three times a day (TID) | INTRAMUSCULAR | Status: DC | PRN
  Administered 2017-11-24: 10 mg via INTRAVENOUS
  Filled 2017-11-23: qty 2

## 2017-11-23 MED ORDER — ONDANSETRON HCL 4 MG/2ML IJ SOLN
INTRAMUSCULAR | Status: AC
  Filled 2017-11-23: qty 2

## 2017-11-23 SURGICAL SUPPLY — 67 items
BAG SPEC THK2 15X12 ZIP CLS (MISCELLANEOUS) ×1
BAG ZIPLOCK 12X15 (MISCELLANEOUS) ×2 IMPLANT
BANDAGE ACE 6X5 VEL STRL LF (GAUZE/BANDAGES/DRESSINGS) ×2 IMPLANT
BANDAGE ESMARK 6X9 LF (GAUZE/BANDAGES/DRESSINGS) ×1 IMPLANT
BLADE SAG 18X100X1.27 (BLADE) ×2 IMPLANT
BLADE SAW SGTL 11.0X1.19X90.0M (BLADE) ×2 IMPLANT
BNDG CMPR 9X6 STRL LF SNTH (GAUZE/BANDAGES/DRESSINGS)
BNDG COHESIVE 6X5 TAN STRL LF (GAUZE/BANDAGES/DRESSINGS) ×1 IMPLANT
BNDG ESMARK 6X9 LF (GAUZE/BANDAGES/DRESSINGS)
BONE CEMENT GENTAMICIN (Cement) ×6 IMPLANT
CEMENT BONE GENTAMICIN 40 (Cement) ×3 IMPLANT
COVER SURGICAL LIGHT HANDLE (MISCELLANEOUS) ×2 IMPLANT
CUFF TOURN SGL QUICK 34 (TOURNIQUET CUFF) ×2
CUFF TRNQT CYL 34X4X40X1 (TOURNIQUET CUFF) ×2 IMPLANT
DRAPE EXTREMITY T 121X128X90 (DRAPE) ×2 IMPLANT
DRAPE POUCH INSTRU U-SHP 10X18 (DRAPES) ×2 IMPLANT
DRAPE U-SHAPE 47X51 STRL (DRAPES) ×2 IMPLANT
DRSG ADAPTIC 3X8 NADH LF (GAUZE/BANDAGES/DRESSINGS) ×2 IMPLANT
DRSG PAD ABDOMINAL 8X10 ST (GAUZE/BANDAGES/DRESSINGS) ×2 IMPLANT
DURAPREP 26ML APPLICATOR (WOUND CARE) ×2 IMPLANT
ELECT REM PT RETURN 15FT ADLT (MISCELLANEOUS) ×2 IMPLANT
EVACUATOR 1/8 PVC DRAIN (DRAIN) ×2 IMPLANT
FACESHIELD WRAPAROUND (MASK) ×10 IMPLANT
FACESHIELD WRAPAROUND OR TEAM (MASK) ×5 IMPLANT
FEMUR SIGMA PS SZ 3.0 R (Femur) ×1 IMPLANT
GAUZE SPONGE 4X4 12PLY STRL (GAUZE/BANDAGES/DRESSINGS) ×2 IMPLANT
GLOVE BIO SURGEON STRL SZ7.5 (GLOVE) ×2 IMPLANT
GLOVE BIO SURGEON STRL SZ8 (GLOVE) ×4 IMPLANT
GLOVE BIOGEL PI IND STRL 8 (GLOVE) ×1 IMPLANT
GLOVE BIOGEL PI INDICATOR 8 (GLOVE) ×1
GLOVE ECLIPSE 7.0 STRL STRAW (GLOVE) ×5 IMPLANT
GLOVE ECLIPSE 7.5 STRL STRAW (GLOVE) ×3 IMPLANT
GLOVE OPTIFIT SS 6.5 STRL BRWN (GLOVE) ×1 IMPLANT
GOWN STRL REUS W/TWL LRG LVL3 (GOWN DISPOSABLE) ×4 IMPLANT
GOWN STRL REUS W/TWL XL LVL3 (GOWN DISPOSABLE) ×3 IMPLANT
HANDPIECE INTERPULSE COAX TIP (DISPOSABLE) ×2
IMMOBILIZER KNEE 20 (SOFTGOODS) ×2
IMMOBILIZER KNEE 20 THIGH 36 (SOFTGOODS) IMPLANT
INSERT PFC SIG STB SZ3 15.0MM (Knees) ×1 IMPLANT
MANIFOLD NEPTUNE II (INSTRUMENTS) ×2 IMPLANT
NS IRRIG 1000ML POUR BTL (IV SOLUTION) ×4 IMPLANT
PAD ABD 8X10 STRL (GAUZE/BANDAGES/DRESSINGS) ×1 IMPLANT
PADDING CAST COTTON 6X4 STRL (CAST SUPPLIES) ×3 IMPLANT
POSITIONER SURGICAL ARM (MISCELLANEOUS) ×2 IMPLANT
SET HNDPC FAN SPRY TIP SCT (DISPOSABLE) ×1 IMPLANT
STAPLER VISISTAT 35W (STAPLE) ×1 IMPLANT
STRIP CLOSURE SKIN 1/2X4 (GAUZE/BANDAGES/DRESSINGS) ×1 IMPLANT
SUCTION FRAZIER HANDLE 10FR (MISCELLANEOUS)
SUCTION TUBE FRAZIER 10FR DISP (MISCELLANEOUS) ×1 IMPLANT
SUT MNCRL AB 4-0 PS2 18 (SUTURE) ×1 IMPLANT
SUT MON AB 4-0 SH 27 (SUTURE) ×1 IMPLANT
SUT PDS AB 1 CT1 27 (SUTURE) ×1 IMPLANT
SUT STRATAFIX SPIRAL 3-0 PDS+ (SUTURE) ×2
SUT VIC AB 1 CT1 27 (SUTURE)
SUT VIC AB 1 CT1 27XBRD ANTBC (SUTURE) ×3 IMPLANT
SUT VIC AB 2-0 CT1 27 (SUTURE) ×6
SUT VIC AB 2-0 CT1 TAPERPNT 27 (SUTURE) ×3 IMPLANT
SUTURE STRATFX SPIRAL 3-0 PDS+ (SUTURE) IMPLANT
SWAB COLLECTION DEVICE MRSA (MISCELLANEOUS) ×2 IMPLANT
SWAB CULTURE ESWAB REG 1ML (MISCELLANEOUS) ×2 IMPLANT
TOWEL OR 17X26 10 PK STRL BLUE (TOWEL DISPOSABLE) ×1 IMPLANT
TOWER CARTRIDGE SMART MIX (DISPOSABLE) ×2 IMPLANT
TRAY FOLEY CATH 14FRSI W/METER (CATHETERS) ×1 IMPLANT
TRAY FOLEY W/METER SILVER 16FR (SET/KITS/TRAYS/PACK) ×1 IMPLANT
WATER STERILE IRR 1000ML POUR (IV SOLUTION) ×2 IMPLANT
WRAP KNEE MAXI GEL POST OP (GAUZE/BANDAGES/DRESSINGS) ×3 IMPLANT
YANKAUER SUCT BULB TIP 10FT TU (MISCELLANEOUS) ×1 IMPLANT

## 2017-11-23 NOTE — Anesthesia Preprocedure Evaluation (Signed)
Anesthesia Evaluation  Patient identified by MRN, date of birth, ID band Patient awake    Reviewed: Allergy & Precautions, NPO status , Patient's Chart, lab work & pertinent test results  Airway Mallampati: II  TM Distance: <3 FB Neck ROM: Full    Dental no notable dental hx.    Pulmonary neg pulmonary ROS,    Pulmonary exam normal breath sounds clear to auscultation       Cardiovascular hypertension, Normal cardiovascular exam Rhythm:Regular Rate:Normal     Neuro/Psych negative neurological ROS  negative psych ROS   GI/Hepatic negative GI ROS, Neg liver ROS,   Endo/Other  diabetesHypothyroidism   Renal/GU negative Renal ROS  negative genitourinary   Musculoskeletal negative musculoskeletal ROS (+)   Abdominal   Peds negative pediatric ROS (+)  Hematology negative hematology ROS (+)   Anesthesia Other Findings   Reproductive/Obstetrics negative OB ROS                            Anesthesia Physical Anesthesia Plan  ASA: III  Anesthesia Plan: Spinal   Post-op Pain Management:    Induction: Intravenous  PONV Risk Score and Plan: 3 and Ondansetron and Treatment may vary due to age or medical condition  Airway Management Planned: Simple Face Mask  Additional Equipment:   Intra-op Plan:   Post-operative Plan:   Informed Consent: I have reviewed the patients History and Physical, chart, labs and discussed the procedure including the risks, benefits and alternatives for the proposed anesthesia with the patient or authorized representative who has indicated his/her understanding and acceptance.   Dental advisory given  Plan Discussed with: CRNA and Surgeon  Anesthesia Plan Comments:         Anesthesia Quick Evaluation

## 2017-11-23 NOTE — H&P (Signed)
Subjective:   Patient is a 67 y.o. female presents with an infected right Total Knee Arthroplasty. She had her index procedure performed in [place] several years ago and did well until she had the acute onset of septic arthritis last summer. This was treated immediately with irrigation and debridement and poly exchange followed by 6 weeks of IV antibiotics and 6 months of oral antibiotics. She recently developed a Group B strep UTI and has had increased pain and swelling of her right knee. Initial aspiration was negative for infection but aspiration last week in the office showed Group B strep. She has had persistent pain and swelling. She presents today for resection arthroplasty and antibiotic spacer placement  Patient Active Problem List   Diagnosis Date Noted  . OA (osteoarthritis) of knee 02/12/2015    Priority: High  . Medication monitoring encounter 06/17/2017  . Group B streptococcal infection   . Infection of total right knee replacement ([place]) 01/22/2017  . Essential hypertension, benign 11/10/2013  . Pure hypercholesterolemia 11/10/2013  . Anemia 08/13/2011   Past Medical History:  Diagnosis Date  . Allergic rhinitis   . Degenerative disc disease, cervical   . Diabetes mellitus without complication ([place])    type two  . Diarrhea 04/2012   colon nl, BX NPD; no response to cholestyramin 03-2012 no response to align; transient resp to sucralfate 12-13  . H/O CT scan of abdomen 08/2012   neg; egd bile reflux gastritisand linear eros; transient resp to sucralfate   . H/O ulcer disease   . Heart murmur    hx of " comes and goes"   . History of blood transfusion   . Hyperlipidemia   . Hyperplastic polyps of stomach   . Hypertension   . Hypothyroidism   . IBS (irritable bowel syndrome)   . Kissing, osteophytes    Dr Carloyn Manner  . Menopause   . Mild anemia 06/2011   14% iron sat,ferritin 14- from Metformin - anemia resolved   . Obesity   . Osteoarthritis   . PONV (postoperative  nausea and vomiting)    years ago     Past Surgical History:  Procedure Laterality Date  . CESAREAN SECTION    . CHOLECYSTECTOMY    . COLONOSCOPY  2/06,04/20/12 repeat 10 years   dr Cristina Gong  . I&D KNEE WITH POLY EXCHANGE Right 01/22/2017   Procedure: IRRIGATION AND DEBRIDEMENT KNEE WITH POLY EXCHANGE;  Surgeon: Paralee Cancel, MD;  Location: [facility];  Service: Orthopedics;  Laterality: Right;  . JOINT REPLACEMENT     right knee   . TOTAL KNEE ARTHROPLASTY Left 02/12/2015   Procedure: LEFT TOTAL KNEE ARTHROPLASTY;  Surgeon: Gaynelle Arabian, MD;  Location: [facility];  Service: Orthopedics;  Laterality: Left;    No medications prior to admission.   Allergies  Allergen Reactions  . Augmentin [Amoxicillin-Pot Clavulanate] Other (See Comments)    IN HIGH DOSES - makes her sick to stomach   . Crestor [Rosuvastatin] Other (See Comments)    Myalgias   . Gabapentin Other (See Comments)    SEDATION AND HEADACHES  . Metformin Diarrhea  . Niaspan [Niacin Er] Other (See Comments)    Myalgia   . Premarin [Estrogens Conjugated] Other (See Comments)    Unknown  . Provera [Medroxyprogesterone Acetate] Other (See Comments)    "makes pt crazy"    Social History   Tobacco Use  . Smoking status: Never Smoker  . Smokeless tobacco: Never Used  Substance Use Topics  . Alcohol use:  No    History reviewed. No pertinent family history.  Review of Systems Pertinent items are noted in HPI.  Objective:   No data found. No intake/output data recorded. No intake/output data recorded.    General appearance: alert, cooperative and appears stated age Head: atraumatic Neck: no adenopathy, no carotid bruit, no JVD, supple, symmetrical, trachea midline and thyroid not enlarged, symmetric, no tenderness/mass/nodules Lungs: clear to auscultation bilaterally Heart: regular rate and rhythm, S1, S2 normal, no murmur, click, rub or gallop Abdomen: soft, non-tender; bowel sounds normal; no masses,  no  organomegaly Extremities: Right knee with warmth and effusion. No erythema Neurologic: Alert and oriented X 3, normal strength and tone. Normal symmetric reflexes. Normal coordination and gait .   Assessment:   Active Problems:   * No active hospital problems. * Septic Arthritis Right knee  Plan:   Plan resection arthroplasty and antibiotic spacer placement. Discussed n detail with patient who elects to proceed

## 2017-11-23 NOTE — Anesthesia Procedure Notes (Signed)
Procedure Name: LMA Insertion Date/Time: 11/23/2017 4:40 PM Performed by: Claudia Desanctis, CRNA Pre-anesthesia Checklist: Emergency Drugs available, Patient identified, Suction available and Patient being monitored Patient Re-evaluated:Patient Re-evaluated prior to induction Oxygen Delivery Method: Circle system utilized Preoxygenation: Pre-oxygenation with 100% oxygen Induction Type: IV induction Ventilation: Mask ventilation without difficulty LMA: LMA inserted LMA Size: 4.0 Number of attempts: 1 Placement Confirmation: positive ETCO2 and breath sounds checked- equal and bilateral Tube secured with: Tape Dental Injury: Teeth and Oropharynx as per pre-operative assessment

## 2017-11-23 NOTE — Brief Op Note (Signed)
11/23/2017  5:43 PM  PATIENT:  Sheri [name]  67 y.o. female  PRE-OPERATIVE DIAGNOSIS:  Infected Right total knee arthroplasty  POST-OPERATIVE DIAGNOSIS:  Infected Right total knee arthroplasty  PROCEDURE:  Procedure(s): Right knee resection arthroplasty; antibiotic spacer (Right)  SURGEON:  Surgeon(s) and Role:    Gaynelle Arabian, MD - Primary  PHYSICIAN ASSISTANT:   ASSISTANTS: [name], PA-C   ANESTHESIA:   general  EBL:  25 ml  BLOOD ADMINISTERED:none  DRAINS: (Medium) Hemovact drain(s) in the right knee with  Suction Open   LOCAL MEDICATIONS USED:  NONE  COUNTS:  YES  TOURNIQUET:   Total Tourniquet Time Documented: Thigh (Right) - 42 minutes Total: Thigh (Right) - 42 minutes   DICTATION: .Other Dictation: Dictation Number [id number]  PLAN OF CARE: Admit to inpatient   PATIENT DISPOSITION:  PACU - hemodynamically stable.

## 2017-11-23 NOTE — Op Note (Signed)
NAMENAKYIAH, [place] NO.:  [id number]  MEDICAL RECORD NO.:  [id number]  LOCATION:                                 FACILITY:  PHYSICIAN:  Gaynelle Arabian, M.D.         DATE OF BIRTH:  DATE OF PROCEDURE:  11/23/2017 DATE OF DISCHARGE:                              OPERATIVE REPORT   PREOPERATIVE DIAGNOSIS:  Infected right total knee arthroplasty.  POSTOPERATIVE DIAGNOSIS:  Infected right total knee arthroplasty.  PROCEDURE:  Right knee resection arthroplasty with antibiotic spacer.  SURGEON:  Gaynelle Arabian, M.D.  ASSISTANT:  Alexzandrew L. Perkins, PA-C.  ANESTHESIA:  General.  ESTIMATED BLOOD LOSS:  Minimal.  DRAINS:  Hemovac x1.  SPECIMEN:  Synovial fluid for Gram stain, culture and sensitivity.  COMPLICATIONS:  None.  TOURNIQUET TIME:  43 minutes at 300 mmHg.  CONDITION:  Stable to recovery.  BRIEF CLINICAL NOTE:  Ms. Chisom is a 67 year old female had a right total knee done in [place] several years ago.  This past summer of 2018 she presented acutely with an infection which was treated with open irrigation, debridement and polyethylene exchange.  This was followed by IV antibiotics and then oral antibiotics.  She did well until recently when she developed a painful swollen knee.  This was in the setting of a urinary tract infection.  Initial aspiration showed no bacteria, but recent aspiration last week showed group B strep, which was consistent with her initial infection.  She presents now for resection, arthroplasty, and antibiotic spacer.  PROCEDURE IN DETAIL:  After successful administration of general anesthetic, a tourniquet was placed high on the right thigh and right lower extremity prepped and draped in usual sterile fashion.  The extremity was wrapped in Esmarch.  Knee flexed and tourniquet inflated to 300 mmHg.  A midline incision was made with a 10 blade through subcutaneous tissue to the extensor mechanism.  A fresh blade was  used to make a medial parapatellar arthrotomy.  There was seropurulent fluid in the joint, which was sent for Gram stain, culture and sensitivity. Thorough synovectomy was performed.  Soft tissue on the proximal medial tibia subperiosteally elevated through the joint line with a knife and into the semimembranosus bursa with a Cobb elevator.  Soft tissue laterally was elevated with attention being paid to avoiding the patellar tendon on tibial tubercle.  The patella was subluxed and the knee flexed to 90 degrees.  An osteotome was used to remove the tibial polyethylene from the tibial tray.  Circumferential retraction was placed and the tibia subluxed forward. Using osteotomes and a saw, the interface between the tibial component and bone was disrupted to loosen the tibial component.  The tibial component was removed and an excess cement in the tibial canal removed. The bone was healthy appearing and stable.  We then addressed the femur.  The femoral component was removed similarly by disrupting interface between the component and bone with osteotomes.  The component was removed with minimal to no bone loss.  I accessed the femoral canal and thoroughly irrigated that.  We then placed a 5-degree right valgus alignment guide and pinned the distal femoral cutting block to remove about 2 mm from  the cut bone surface.  A size 3 Sigma posterior stabilized femur was most appropriate.  We did the distal femoral cut and then the box cut for that.  On the tibial side, we did not need to make any cuts.  We then thoroughly irrigated the joint with 3 L of saline using pulsatile lavage.  I then mixed 3 batches of gentamicin impregnated cement and loosely cemented a size 3 posterior stabilized femoral component, and a 15-mm size 3 insert was cemented into the tibia.  The knee was held in full extension and extruded cement removed.  While cement was hardening, the patellar component was removed from  the patella with an oscillating saw.  I then removed the plastic lugs out of the bone.  The wound was further irrigated.  The cement was fully hardened, full extension was achieved, and I could flex down to 90.  The arthrotomy was then closed over a Hemovac drain with a running #1 Stratafix suture.  Tourniquet was released for a total time of 42 minutes.  Minor bleeding stopped with cautery.  Subcu was closed with interrupted 2-0 Vicryl and subcuticular running 4-0 Monocryl.  Incision was cleaned and dried and Steri-Strips and a bulky sterile dressing applied.  She was placed into a knee immobilizer, awakened and transported to recovery in stable condition.     Gaynelle Arabian, M.D.     FA/MEDQ  D:  11/23/2017  T:  11/23/2017  Job:  [id number]

## 2017-11-24 ENCOUNTER — Other Ambulatory Visit: Payer: Self-pay

## 2017-11-24 ENCOUNTER — Inpatient Hospital Stay: Payer: Self-pay

## 2017-11-24 ENCOUNTER — Encounter (HOSPITAL_COMMUNITY): Payer: Self-pay

## 2017-11-24 DIAGNOSIS — Z96653 Presence of artificial knee joint, bilateral: Secondary | ICD-10-CM

## 2017-11-24 DIAGNOSIS — B951 Streptococcus, group B, as the cause of diseases classified elsewhere: Secondary | ICD-10-CM

## 2017-11-24 DIAGNOSIS — Z87440 Personal history of urinary (tract) infections: Secondary | ICD-10-CM

## 2017-11-24 DIAGNOSIS — M00261 Other streptococcal arthritis, right knee: Secondary | ICD-10-CM

## 2017-11-24 DIAGNOSIS — E119 Type 2 diabetes mellitus without complications: Secondary | ICD-10-CM

## 2017-11-24 DIAGNOSIS — T8453XA Infection and inflammatory reaction due to internal right knee prosthesis, initial encounter: Principal | ICD-10-CM

## 2017-11-24 DIAGNOSIS — M1990 Unspecified osteoarthritis, unspecified site: Secondary | ICD-10-CM

## 2017-11-24 LAB — CBC
HCT: 32.2 % — ABNORMAL LOW (ref 36.0–46.0)
Hemoglobin: 10.1 g/dL — ABNORMAL LOW (ref 12.0–15.0)
MCH: 28.4 pg (ref 26.0–34.0)
MCHC: 31.4 g/dL (ref 30.0–36.0)
MCV: 90.4 fL (ref 78.0–100.0)
Platelets: 584 10*3/uL — ABNORMAL HIGH (ref 150–400)
RBC: 3.56 MIL/uL — ABNORMAL LOW (ref 3.87–5.11)
RDW: 13.7 % (ref 11.5–15.5)
WBC: 10.7 10*3/uL — ABNORMAL HIGH (ref 4.0–10.5)

## 2017-11-24 LAB — BASIC METABOLIC PANEL
Anion gap: 9 (ref 5–15)
BUN: 10 mg/dL (ref 6–20)
CO2: 26 mmol/L (ref 22–32)
Calcium: 9.1 mg/dL (ref 8.9–10.3)
Chloride: 101 mmol/L (ref 101–111)
Creatinine, Ser: 0.72 mg/dL (ref 0.44–1.00)
GFR calc Af Amer: >60 mL/min (ref >60)
GFR calc non Af Amer: >60 mL/min (ref >60)
Glucose, Bld: 220 mg/dL — ABNORMAL HIGH (ref 65–99)
Potassium: 4.5 mmol/L (ref 3.5–5.1)
Sodium: 136 mmol/L (ref 135–145)

## 2017-11-24 LAB — SEDIMENTATION RATE: Sed Rate: 77 mm/hr — ABNORMAL HIGH (ref 0–22)

## 2017-11-24 LAB — C-REACTIVE PROTEIN: CRP: 2.9 mg/dL — ABNORMAL HIGH (ref <1.0)

## 2017-11-24 MED ORDER — SODIUM CHLORIDE 0.9 % IV SOLN
2.0000 g | INTRAVENOUS | Status: DC
  Administered 2017-11-24 – 2017-11-25 (×2): 2 g via INTRAVENOUS
  Filled 2017-11-24 (×2): qty 2

## 2017-11-24 MED ORDER — AMLODIPINE BESYLATE 5 MG PO TABS
5.0000 mg | ORAL_TABLET | Freq: Every day | ORAL | Status: DC
  Administered 2017-11-24 – 2017-11-25 (×2): 5 mg via ORAL
  Filled 2017-11-24 (×2): qty 1

## 2017-11-24 MED ORDER — HYDROMORPHONE HCL 1 MG/ML IJ SOLN
0.5000 mg | INTRAMUSCULAR | Status: DC | PRN
  Administered 2017-11-24 – 2017-11-25 (×6): 0.5 mg via INTRAVENOUS
  Filled 2017-11-24 (×6): qty 0.5

## 2017-11-24 MED ORDER — SODIUM CHLORIDE 0.9% FLUSH
10.0000 mL | INTRAVENOUS | Status: DC | PRN
  Administered 2017-11-24: 10 mL
  Filled 2017-11-24: qty 40

## 2017-11-24 NOTE — Progress Notes (Signed)
[facility]  New pt for [facility] this admission. [facility] will provide HHservices, HHRN and PT as well as [place] services for home IV ABX.  [facility] hospital team will follow with ID team to support DC when ordered.  If patient discharges after hours, please call [phone].   Larry Sierras 11/24/2017, 2:08 PM

## 2017-11-24 NOTE — Transfer of Care (Signed)
Immediate Anesthesia Transfer of Care Note  Patient: Sheri [name]  Procedure(s) Performed: Right knee resection arthroplasty; antibiotic spacer (Right Knee)  Patient Location: PACU  Anesthesia Type:General  Level of Consciousness: awake, alert , oriented and patient cooperative  Airway & Oxygen Therapy: Patient Spontanous Breathing  Post-op Assessment: Report given to RN and Post -op Vital signs reviewed and stable  Post vital signs: Reviewed and stable  Last Vitals:  Vitals Value Taken Time  BP    Temp    Pulse    Resp    SpO2      Last Pain:  Vitals:   11/24/17 0646  TempSrc:   PainSc: 6       Patients Stated Pain Goal: 0 (24/23/53 6144)  Complications: No apparent anesthesia complications

## 2017-11-24 NOTE — Plan of Care (Signed)
Plan of care discussed with improved use of incentive spirometer and management of pain.

## 2017-11-24 NOTE — Progress Notes (Signed)
Peripherally Inserted Central Catheter/Midline Placement  The IV Nurse has discussed with the patient and/or persons authorized to consent for the patient, the purpose of this procedure and the potential benefits and risks involved with this procedure.  The benefits include less needle sticks, lab draws from the catheter, and the patient may be discharged home with the catheter. Risks include, but not limited to, infection, bleeding, blood clot (thrombus formation), and puncture of an artery; nerve damage and irregular heartbeat and possibility to perform a PICC exchange if needed/ordered by physician.  Alternatives to this procedure were also discussed.  Bard Power PICC patient education guide, fact sheet on infection prevention and patient information card has been provided to patient /or left at bedside.    PICC/Midline Placement Documentation  PICC Single Lumen 95/74/73 PICC Right Basilic 40 cm 0 cm (Active)     PICC Single Lumen 40/37/09 PICC Right Basilic 40 cm 0 cm (Active)  Indication for Insertion or Continuance of Line Home intravenous therapies (PICC only) 11/24/2017  3:00 PM  Exposed Catheter (cm) 0 cm 11/24/2017  3:00 PM  Site Assessment Clean;Dry;Intact 11/24/2017  3:00 PM  Line Status Flushed;Blood return noted 11/24/2017  3:00 PM  Dressing Type Transparent 11/24/2017  3:00 PM  Dressing Status Clean;Dry;Intact;Antimicrobial disc in place 11/24/2017  3:00 PM  Dressing Intervention New dressing 11/24/2017  3:00 PM  Dressing Change Due 12/01/17 11/24/2017  3:00 PM       Christella Noa Albarece 11/24/2017, 3:22 PM

## 2017-11-24 NOTE — Consult Note (Signed)
[facility]    Date of Admission:  11/23/2017   Total days of antibiotics: 1 ancef               Reason for Consult: Infected R TKA    Referring Provider: Aluisio   Assessment: Infected R TKA Anbx spacer placed 3-25 DM2 (A1C 6.1%) L TKA OA HTN  Plan: 1. Change to ceftriaxone 2. Repeat ESR and CRP.  3. opat consult 4. Would ask ortho for results/sensitivities of the aspirate from clinic 5. Plan for 6 weeks 6. F/u in ID clinic 2 weeks with myself or Dr Linus Salmons 7. Consider ACE-I when f/u with PCP   Thank you so much for this interesting consult,  Principal Problem:   Septic arthritis of knee, right ([place])   . acetaminophen  1,000 mg Oral Q6H  . amLODipine  5 mg Oral Daily  . docusate sodium  100 mg Oral BID  . levothyroxine  112 mcg Oral QAC breakfast  . linagliptin  5 mg Oral Daily  . pantoprazole  40 mg Oral Daily  . rivaroxaban  10 mg Oral Q breakfast    HPI: Sheri [name] is a 67 y.o. female with hx of DM2, OA, previous R TKR 12-2011 in [place], then presented in [place] May 2018 with infection of R TKA. Her Cx grew Group B Strep (GBS). She underwent polyethylene exchange and was treated with ceftriaxone for 6 weeks (end 03-05-17).   At that time her ESR was 35 and her CRP was 6.1. She was transitioned to amoxil for planned 6 months (end 09-03-17). Her follow up ESR and CRP were normal (August, October, December 2018).  She was recently seen by her PCP and treated for GBS UTI (10-20-17) with macrobid. She also developed pain and swelling in her R knee and was treated with amxoil concurrently. She aspirate in ortho office that was (-) per pt. She had f/u with PCP due to dysuria and was changed to augmentin and felt better.  By 3-18 she developed sudden onset of R knee pain. She underwent aspirate in ortho office which again grew GBS. She was restarted on augmentin.  She underwent resection of prosthesis on 11-23-17.   Review of  Systems: Review of Systems  Constitutional: Positive for chills and fever. Negative for weight loss.  Eyes: Negative for blurred vision and double vision.  Gastrointestinal: Negative for abdominal pain, constipation and diarrhea.  Genitourinary: Negative for dysuria.  Neurological: Negative for sensory change.  she had no proximal erythema or streaking.  Please see HPI. All other systems reviewed and negative.   Past Medical History:  Diagnosis Date  . Allergic rhinitis   . Degenerative disc disease, cervical   . Diabetes mellitus without complication ([place])    type two  . Diarrhea 04/2012   colon nl, BX NPD; no response to cholestyramin 03-2012 no response to align; transient resp to sucralfate 12-13  . H/O CT scan of abdomen 08/2012   neg; egd bile reflux gastritisand linear eros; transient resp to sucralfate   . H/O ulcer disease   . Heart murmur    hx of " comes and goes"   . History of blood transfusion   . Hyperlipidemia   . Hyperplastic polyps of stomach   . Hypertension   . Hypothyroidism   . IBS (irritable bowel syndrome)   . Kissing, osteophytes    Dr Carloyn Manner  . Menopause   . Mild  anemia 06/2011   14% iron sat,ferritin 14- from Metformin - anemia resolved   . Obesity   . Osteoarthritis   . PONV (postoperative nausea and vomiting)    years ago     Social History   Tobacco Use  . Smoking status: Never Smoker  . Smokeless tobacco: Never Used  Substance Use Topics  . Alcohol use: No  . Drug use: No    Family History  Problem Relation Age of Onset  . Other Mother 9       sepsis of unknown etiology  . Lung cancer Father 36  . Diabetes Mellitus II Father   . Atrial fibrillation Father      Medications:  Scheduled: . acetaminophen  1,000 mg Oral Q6H  . amLODipine  5 mg Oral Daily  . docusate sodium  100 mg Oral BID  . levothyroxine  112 mcg Oral QAC breakfast  . linagliptin  5 mg Oral Daily  . pantoprazole  40 mg Oral Daily  . rivaroxaban  10 mg Oral Q  breakfast    Abtx:  Anti-infectives (From admission, onward)   Start     Dose/Rate Route Frequency Ordered Stop   11/23/17 2300  ceFAZolin (ANCEF) IVPB 2g/100 mL premix  Status:  Discontinued     2 g 200 mL/hr over 30 Minutes Intravenous Every 6 hours 11/23/17 2027 11/23/17 2037   11/23/17 2200  ceFAZolin (ANCEF) IVPB 2g/100 mL premix     2 g 200 mL/hr over 30 Minutes Intravenous Every 8 hours 11/23/17 2037     11/23/17 1500  ceFAZolin (ANCEF) IVPB 2g/100 mL premix     2 g 200 mL/hr over 30 Minutes Intravenous On call to O.R. 11/23/17 1439 11/23/17 1645        OBJECTIVE: Blood pressure 135/82, pulse 69, temperature 97.7 F (36.5 C), temperature source Oral, resp. rate 14, height '5\' 5"'  (1.651 m), weight 95.3 kg (210 lb), SpO2 100 %.  Physical Exam  Constitutional: She is oriented to person, place, and time and well-developed, well-nourished, and in no distress. No distress.  HENT:  Mouth/Throat: No oropharyngeal exudate.  Eyes: Pupils are equal, round, and reactive to light. EOM are normal.  Neck: Neck supple.  Cardiovascular: Normal rate, regular rhythm and normal heart sounds.  Pulmonary/Chest: Effort normal and breath sounds normal.  Abdominal: Soft. Bowel sounds are normal. There is no tenderness. There is no rebound.  Musculoskeletal: She exhibits no edema.       Legs: Lymphadenopathy:    She has no cervical adenopathy.  Neurological: She is alert and oriented to person, place, and time. She exhibits normal muscle tone.  Skin: She is not diaphoretic.    Lab Results Results for orders placed or performed during the hospital encounter of 11/23/17 (from the past 48 hour(s))  Glucose, capillary     Status: Abnormal   Collection Time: 11/23/17  2:49 PM  Result Value Ref Range   Glucose-Capillary 113 (H) 65 - 99 mg/dL  Surgical pcr screen     Status: None   Collection Time: 11/23/17  2:51 PM  Result Value Ref Range   MRSA, PCR NEGATIVE NEGATIVE   Staphylococcus aureus  NEGATIVE NEGATIVE    Comment: (NOTE) The Xpert SA Assay (FDA approved for NASAL specimens in patients 80 years of age and older), is one component of a comprehensive surveillance program. It is not intended to diagnose infection nor to guide or monitor treatment. Performed at [facility], [place] [address]  [place], [place] [address]   Type and screen All Cardiac and thoracic surgeries, spinal fusions, myomectomies, craniotomies, colon & liver resections, total joint revisions, same day c-section with placenta previa or accreta.     Status: None   Collection Time: 11/23/17  3:55 PM  Result Value Ref Range   ABO/RH(D) A POS    Antibody Screen NEG    Sample Expiration      11/26/2017 Performed at [facility], [place] [address], [place], [place] [address]   Hemoglobin A1c     Status: Abnormal   Collection Time: 11/23/17  3:55 PM  Result Value Ref Range   Hgb A1c MFr Bld 6.1 (H) 4.8 - 5.6 %    Comment: (NOTE) Pre diabetes:          5.7%-6.4% Diabetes:              >6.4% Glycemic control for   <7.0% adults with diabetes    Mean Plasma Glucose 128.37 mg/dL    Comment: Performed at [facility] [address], [place], [place] [address]  Basic metabolic panel     Status: Abnormal   Collection Time: 11/23/17  3:55 PM  Result Value Ref Range   Sodium 139 135 - 145 mmol/L   Potassium 4.3 3.5 - 5.1 mmol/L   Chloride 103 101 - 111 mmol/L   CO2 26 22 - 32 mmol/L   Glucose, Bld 113 (H) 65 - 99 mg/dL   BUN 13 6 - 20 mg/dL   Creatinine, Ser 0.77 0.44 - 1.00 mg/dL   Calcium 9.8 8.9 - 10.3 mg/dL   GFR calc non Af Amer >60 >60 mL/min   GFR calc Af Amer >60 >60 mL/min    Comment: (NOTE) The eGFR has been calculated using the CKD EPI equation. This calculation has not been validated in all clinical situations. eGFR's persistently <60 mL/min signify possible Chronic Kidney Disease.    Anion gap 10 5 - 15    Comment: Performed at [facility], [place] [address] [place], [place] [address]  Aerobic/Anaerobic Culture (surgical/deep wound)     Status: None (Preliminary result)   Collection Time: 11/23/17  5:06 PM  Result Value Ref Range   Specimen Description      SYNOVIAL KNEE RIGHT Performed at [facility] [address], [place], [place] [address]    Special Requests      NONE Performed at [facility], [place] [address], [place], [place] [address]    Gram Stain      MODERATE WBC PRESENT, PREDOMINANTLY PMN NO ORGANISMS SEEN Performed at [facility], [place] [address], [place], [place] [address]    Culture PENDING    Report Status PENDING   Glucose, capillary     Status: Abnormal   Collection Time: 11/23/17  6:42 PM  Result Value Ref Range   Glucose-Capillary 102 (H) 65 - 99 mg/dL   Comment 1 Document in Chart   CBC     Status: Abnormal   Collection Time: 11/24/17  6:21 AM  Result Value Ref Range   WBC 10.7 (H) 4.0 - 10.5 K/uL   RBC 3.56 (L) 3.87 - 5.11 MIL/uL   Hemoglobin 10.1 (L) 12.0 - 15.0 g/dL   HCT 32.2 (L) 36.0 - 46.0 %   MCV 90.4 78.0 - 100.0 fL   MCH 28.4 26.0 - 34.0 pg   MCHC 31.4 30.0 - 36.0 g/dL   RDW 13.7 11.5 -  15.5 %   Platelets 584 (H) 150 - 400 K/uL    Comment: Performed at [facility], [place] [address], [place], [place] [address]  Basic metabolic panel     Status: Abnormal   Collection Time: 11/24/17  6:21 AM  Result Value Ref Range   Sodium 136 135 - 145 mmol/L   Potassium 4.5 3.5 - 5.1 mmol/L   Chloride 101 101 - 111 mmol/L   CO2 26 22 - 32 mmol/L   Glucose, Bld 220 (H) 65 - 99 mg/dL   BUN 10 6 - 20 mg/dL   Creatinine, Ser 0.72 0.44 - 1.00 mg/dL   Calcium 9.1 8.9 - 10.3 mg/dL   GFR calc non Af Amer >60 >60 mL/min   GFR calc Af Amer >60 >60 mL/min    Comment: (NOTE) The eGFR has been calculated using the CKD EPI equation. This calculation has not been validated in all clinical situations. eGFR's persistently  <60 mL/min signify possible Chronic Kidney Disease.    Anion gap 9 5 - 15    Comment: Performed at [facility], [place] [address] [place], [place] [address]      Component Value Date/Time   SDES  11/23/2017 1706    SYNOVIAL KNEE RIGHT Performed at [facility] [address], [place], [place] [address]    SPECREQUEST  11/23/2017 1706    NONE Performed at [facility], [place] [address], [place], [place] [address]    CULT PENDING 11/23/2017 1706   REPTSTATUS PENDING 11/23/2017 1706   [place] Ekg Site Rite  Result Date: 11/24/2017 If Site Rite image not attached, placement could not be confirmed due to current cardiac rhythm.  Recent Results (from the past 240 hour(s))  Surgical pcr screen     Status: None   Collection Time: 11/23/17  2:51 PM  Result Value Ref Range Status   MRSA, PCR NEGATIVE NEGATIVE Final   Staphylococcus aureus NEGATIVE NEGATIVE Final    Comment: (NOTE) The Xpert SA Assay (FDA approved for NASAL specimens in patients 57 years of age and older), is one component of a comprehensive surveillance program. It is not intended to diagnose infection nor to guide or monitor treatment. Performed at [facility], [place] [address] [place], [place] [address]   Aerobic/Anaerobic Culture (surgical/deep wound)     Status: None (Preliminary result)   Collection Time: 11/23/17  5:06 PM  Result Value Ref Range Status   Specimen Description   Final    SYNOVIAL KNEE RIGHT Performed at [facility] [address], [place], [place] [address]    Special Requests   Final    NONE Performed at [facility], [place] [address], [place], [place] [address]    Gram Stain   Final    MODERATE WBC PRESENT, PREDOMINANTLY PMN NO ORGANISMS SEEN Performed at [facility], [place] [address] [place], [place] [address]    Culture PENDING  Incomplete   Report Status PENDING   Incomplete    Microbiology: Recent Results (from the past 240 hour(s))  Surgical pcr screen     Status: None   Collection Time: 11/23/17  2:51 PM  Result Value Ref Range Status   MRSA, PCR NEGATIVE NEGATIVE Final   Staphylococcus aureus NEGATIVE NEGATIVE Final    Comment: (NOTE) The Xpert SA Assay (FDA approved for NASAL specimens in patients 109 years of age and older), is one component of a comprehensive surveillance  program. It is not intended to diagnose infection nor to guide or monitor treatment. Performed at [facility], [place] [address] [place], [place] [address]   Aerobic/Anaerobic Culture (surgical/deep wound)     Status: None (Preliminary result)   Collection Time: 11/23/17  5:06 PM  Result Value Ref Range Status   Specimen Description   Final    SYNOVIAL KNEE RIGHT Performed at [facility] [address], [place], [place] [address]    Special Requests   Final    NONE Performed at [facility], [place] [address] [place], [place] [address]    Gram Stain   Final    MODERATE WBC PRESENT, PREDOMINANTLY PMN NO ORGANISMS SEEN Performed at [facility], [place] [address], [place], [place] [address]    Culture PENDING  Incomplete   Report Status PENDING  Incomplete    Radiographs and labs were personally reviewed by me.   Bobby Rumpf, MD [facility] [phone] 11/24/2017, 4:39 PM

## 2017-11-24 NOTE — Progress Notes (Signed)
Physical Therapy Treatment Patient Details Name: Sheri [name] MRN: [id number] DOB: 01-Jul-1951 Today's Date: 11/24/2017    History of Present Illness s/p R TKA resection with placement of articulating Abx spacerPMHx: L TKA     PT Comments    Pt progressing well; hopes to go home tomorrow. Will follow and continue PT POC   Follow Up Recommendations  Follow surgeon's recommendation for DC plan and follow-up therapies     Equipment Recommendations  Rolling walker with 5" wheels    Recommendations for Other Services       Precautions / Restrictions Precautions Precautions: Knee;Fall Required Braces or Orthoses: Knee Immobilizer - Right Restrictions Weight Bearing Restrictions: No    Mobility  Bed Mobility                  Transfers Overall transfer level: Needs assistance Equipment used: Rolling walker (2 wheeled) Transfers: Sit to/from Stand Sit to Stand: Min guard         General transfer comment: cues for hand placement  Ambulation/Gait Ambulation/Gait assistance: Min guard Ambulation Distance (Feet): 130 Feet Assistive device: Rolling walker (2 wheeled) Gait Pattern/deviations: Step-to pattern;Step-through pattern;Decreased stride length     General Gait Details: cues for RW position   Stairs            Wheelchair Mobility    Modified Rankin (Stroke Patients Only)       Balance                                            Cognition Arousal/Alertness: Awake/alert Behavior During Therapy: WFL for tasks assessed/performed Overall Cognitive Status: Within Functional Limits for tasks assessed                                        Exercises Total Joint Exercises Ankle Circles/Pumps: AROM;Both;10 reps Quad Sets: AROM;Both;10 reps Towel Squeeze: AROM;Both;10 reps Heel Slides: AAROM;Right;10 reps Hip ABduction/ADduction: AROM;Strengthening;10 reps;Right Straight Leg Raises:  AAROM;Strengthening;AROM;Right;10 reps    General Comments        Pertinent Vitals/Pain Pain Assessment: 0-10 Pain Score: 4  Pain Location: right knee Pain Descriptors / Indicators: Sore Pain Intervention(s): Monitored during session    Home Living                      Prior Function            PT Goals (current goals can now be found in the care plan section) Acute Rehab PT Goals Patient Stated Goal: get over this PT Goal Formulation: With patient Time For Goal Achievement: 11/24/17 Potential to Achieve Goals: Good Progress towards PT goals: Progressing toward goals    Frequency    7X/week      PT Plan Current plan remains appropriate    Co-evaluation              AM-PAC PT "6 Clicks" Daily Activity  Outcome Measure  Difficulty turning over in bed (including adjusting bedclothes, sheets and blankets)?: A Little Difficulty moving from lying on back to sitting on the side of the bed? : Unable Difficulty sitting down on and standing up from a chair with arms (e.g., wheelchair, bedside commode, etc,.)?: A Little Help needed moving to and from a bed to chair (including a wheelchair)?:  A Little Help needed walking in hospital room?: A Little Help needed climbing 3-5 steps with a railing? : A Lot 6 Click Score: 15    End of Session Equipment Utilized During Treatment: Gait belt;Right knee immobilizer Activity Tolerance: Patient tolerated treatment well Patient left: in chair;with call bell/phone within reach;with family/visitor present;with chair alarm set   PT Visit Diagnosis: Difficulty in walking, not elsewhere classified (R26.2)     Time: 9449-6759 PT Time Calculation (min) (ACUTE ONLY): 24 min  Charges:  $Gait Training: 8-22 mins $Therapeutic Exercise: 8-22 mins                    G CodesKenyon Ana, PT Pager: [phone] 11/24/2017    [facility] 11/24/2017, 3:57 PM

## 2017-11-24 NOTE — Progress Notes (Addendum)
Subjective: 1 Day Post-Op Procedure(s) (LRB): Right knee resection arthroplasty; antibiotic spacer (Right) Patient reports pain as moderate and severe.   Patient seen in rounds for [name]. Patient is having problems with pain in the knee, requiring pain medications We will start therapy today.  Order PICC line Consult I D  Objective: Vital signs in last 24 hours: Temp:  [97.7 F (36.5 C)-99.4 F (37.4 C)] 97.9 F (36.6 C) (03/26 0926) Pulse Rate:  [65-81] 68 (03/26 0926) Resp:  [11-16] 14 (03/26 0926) BP: (122-174)/(64-96) 131/72 (03/26 0926) SpO2:  [95 %-100 %] 100 % (03/26 0926) Weight:  [95.3 kg (210 lb)] 95.3 kg (210 lb) (03/25 2020)  Intake/Output from previous day:  Intake/Output Summary (Last 24 hours) at 11/24/2017 1312 Last data filed at 11/24/2017 1110 Gross per 24 hour  Intake 4353.33 ml  Output 2640 ml  Net 1713.33 ml    Intake/Output this shift: Total I/O In: 240 [P.O.:240] Out: 890 [Urine:800; Drains:90]  Labs: Recent Labs    11/24/17 0621  HGB 10.1*   Recent Labs    11/24/17 0621  WBC 10.7*  RBC 3.56*  HCT 32.2*  PLT 584*   Recent Labs    11/23/17 1555 11/24/17 0621  NA 139 136  K 4.3 4.5  CL 103 101  CO2 26 26  BUN 13 10  CREATININE 0.77 0.72  GLUCOSE 113* 220*  CALCIUM 9.8 9.1   No results for input(s): LABPT, INR in the last 72 hours.  EXAM General - Patient is Alert, Appropriate and Oriented Extremity - Neurovascular intact Sensation intact distally Intact pulses distally Dressing - dressing C/D/I Motor Function - intact, moving foot and toes well on exam.  Hemovac left in place  Past Medical History:  Diagnosis Date  . Allergic rhinitis   . Degenerative disc disease, cervical   . Diabetes mellitus without complication ([place])    type two  . Diarrhea 04/2012   colon nl, BX NPD; no response to cholestyramin 03-2012 no response to align; transient resp to sucralfate 12-13  . H/O CT scan of abdomen 08/2012   neg;  egd bile reflux gastritisand linear eros; transient resp to sucralfate   . H/O ulcer disease   . Heart murmur    hx of " comes and goes"   . History of blood transfusion   . Hyperlipidemia   . Hyperplastic polyps of stomach   . Hypertension   . Hypothyroidism   . IBS (irritable bowel syndrome)   . Kissing, osteophytes    Dr Carloyn Manner  . Menopause   . Mild anemia 06/2011   14% iron sat,ferritin 14- from Metformin - anemia resolved   . Obesity   . Osteoarthritis   . PONV (postoperative nausea and vomiting)    years ago     Assessment/Plan: 1 Day Post-Op Procedure(s) (LRB): Right knee resection arthroplasty; antibiotic spacer (Right) Principal Problem:   Septic arthritis of knee, right ([facility])  Estimated body mass index is 34.95 kg/m as calculated from the following:   Height as of this encounter: '5\' 5"'  (1.651 m).   Weight as of this encounter: 95.3 kg (210 lb). Up with therapy Work on pain control DVT Prophylaxis - Xarelto Weight-Bearing as tolerated to right leg D/C O2 and Pulse OX and try on Room Air  [name], PA-C Orthopaedic Surgery 11/24/2017, 1:12 PM   Addendum Notes for I.D. Right Total Knee Replacement - 2013 - [place], [place] Left Total Knee Replacement - [name] - June 2016  01/22/2017 - I & D RIGHT TKA ([name]) for Septic Joint  Few Group B Strep (S.agalactiae) isolated from surgical culture  11/23/2017 - Resection Right Total Knee, Articulating ABX Spacer  New Intraop cultures pending at this time.  PICC Line ordered.  [name], PA-C

## 2017-11-24 NOTE — Evaluation (Signed)
Physical Therapy Evaluation Patient Details Name: Sheri [name] MRN: [id number] DOB: Mar 28, 1951 Today's Date: 11/24/2017   History of Present Illness  s/p R TKA resection with placement of articulating Abx spacerPMHx: L TKA   Clinical Impression  Pt is s/p TKA resulting in the deficits listed below (see PT Problem List). Pt doing well today, amb 120' with min/guard, able to progress to step through gait; pain well controlled; will continue to follow;  Pt will benefit from skilled PT to increase their independence and safety with mobility to allow discharge to the venue listed below.      Follow Up Recommendations Follow surgeon's recommendation for DC plan and follow-up therapies    Equipment Recommendations  Rolling walker with 5" wheels    Recommendations for Other Services       Precautions / Restrictions Precautions Precautions: Knee;Fall Required Braces or Orthoses: Knee Immobilizer - Right Restrictions RLE Weight Bearing: Weight bearing as tolerated      Mobility  Bed Mobility Overal bed mobility: Needs Assistance Bed Mobility: Supine to Sit     Supine to sit: Min assist     General bed mobility comments: with RLE, incr time  Transfers Overall transfer level: Needs assistance Equipment used: Rolling walker (2 wheeled) Transfers: Sit to/from Stand Sit to Stand: Min assist;Min guard         General transfer comment: cues for hand placement  Ambulation/Gait Ambulation/Gait assistance: Min guard Ambulation Distance (Feet): 120 Feet Assistive device: Rolling walker (2 wheeled) Gait Pattern/deviations: Step-to pattern;Step-through pattern;Decreased stride length Gait velocity: decr   General Gait Details: cues for sequence initially, progressed to step through gait  Stairs            Wheelchair Mobility    Modified Rankin (Stroke Patients Only)       Balance                                             Pertinent  Vitals/Pain Pain Assessment: 0-10 Pain Score: 4  Pain Location: right knee Pain Descriptors / Indicators: Sore Pain Intervention(s): Monitored during session;Limited activity within patient's tolerance;Repositioned;Ice applied    Home Living Family/patient expects to be discharged to:: Private residence Living Arrangements: Spouse/significant other Available Help at Discharge: Family Type of Home: House Home Access: Stairs to enter   [employer] of Steps: 1 small step Home Layout: Two level;Able to live on main level with bedroom/bathroom Home Equipment: Kasandra Knudsen - single point;Bedside commode;Tub bench;Walker - 4 wheels      Prior Function Level of Independence: Independent               Hand Dominance        Extremity/Trunk Assessment   Upper Extremity Assessment Upper Extremity Assessment: Defer to OT evaluation    Lower Extremity Assessment Lower Extremity Assessment: RLE deficits/detail RLE Deficits / Details: ankle WFL; knee extension and hip flexion grossly  2+/5; knee AAROM 10--40*       Communication   Communication: No difficulties  Cognition Arousal/Alertness: Awake/alert Behavior During Therapy: WFL for tasks assessed/performed Overall Cognitive Status: Within Functional Limits for tasks assessed                                        General Comments  Exercises Total Joint Exercises Ankle Circles/Pumps: AROM;Both;10 reps Quad Sets: AROM;Both;5 reps Heel Slides: AAROM;Right;10 reps   Assessment/Plan    PT Assessment Patient needs continued PT services  PT Problem List Decreased strength;Decreased range of motion;Decreased mobility;Decreased activity tolerance;Decreased knowledge of use of DME;Decreased knowledge of precautions       PT Treatment Interventions DME instruction;Gait training;Functional mobility training;Therapeutic activities;Therapeutic exercise;Patient/family education;Stair training    PT  Goals (Current goals can be found in the Care Plan section)  Acute Rehab PT Goals Patient Stated Goal: get over this PT Goal Formulation: With patient Time For Goal Achievement: 11/24/17 Potential to Achieve Goals: Good    Frequency 7X/week   Barriers to discharge        Co-evaluation               AM-PAC PT "6 Clicks" Daily Activity  Outcome Measure Difficulty turning over in bed (including adjusting bedclothes, sheets and blankets)?: A Little Difficulty moving from lying on back to sitting on the side of the bed? : Unable Difficulty sitting down on and standing up from a chair with arms (e.g., wheelchair, bedside commode, etc,.)?: A Little Help needed moving to and from a bed to chair (including a wheelchair)?: A Little Help needed walking in hospital room?: A Little Help needed climbing 3-5 steps with a railing? : A Lot 6 Click Score: 15    End of Session Equipment Utilized During Treatment: Gait belt;Right knee immobilizer Activity Tolerance: Patient tolerated treatment well Patient left: in chair;with call bell/phone within reach;with family/visitor present;with chair alarm set   PT Visit Diagnosis: Difficulty in walking, not elsewhere classified (R26.2)    Time: 0100-7121 PT Time Calculation (min) (ACUTE ONLY): 30 min   Charges:   PT Evaluation $PT Eval Low Complexity: 1 Low PT Treatments $Gait Training: 8-22 mins   PT G CodesKenyon Ana, PT Pager: [phone] 11/24/2017   Kenyon Ana 11/24/2017, 11:22 AM

## 2017-11-24 NOTE — Progress Notes (Signed)
Discharge planning, spoke with patient,daughter, and spouse at beside. Chose [facility] for [facility] services, PT to eval and treat and RN for home antibiotics. Contacted [facility] for referral. Needs a RW contacted [facility] to deliver to the room. [phone]

## 2017-11-24 NOTE — Progress Notes (Signed)
Office results of Right Knee Aspiration performed on 11/17/2017 in the office.  Synovial Fluid Color - Red Appearance Bloody Total Nucleated Cell Ct - 15775 H Neutrophils - 90 H  Gram Stain - Moderate White Blood Cells Seen Result - Light growth of Group B Streptococcus isolated  Sensitivities: Ampicillin S <0.25 Cefotaxime S <0.12 Ceftriaxone S <0.12 Clindamycin S <0.25 Levofloxacin S 0.5 Penicillin S <0.06 Vancomycin S 0.5  Will fax over report to be added to the hard chart.  [name], PA-C

## 2017-11-24 NOTE — Progress Notes (Signed)
PHARMACY CONSULT NOTE FOR: OUTPATIENT  PARENTERAL ANTIBIOTIC THERAPY (OPAT)  Indication: Infected R TKA Regimen: Ceftriaxone 2g IV q24h End date: 6 weeks, 01/04/18  IV antibiotic discharge orders are pended. To discharging provider:  please sign these orders via discharge navigator,  Select New Orders & click on the button choice - Manage This Unsigned Work.     Thank you for allowing pharmacy to be a part of this patient's care.  [name] PharmD, BCPS Pager [phone] 11/24/2017 5:52 PM

## 2017-11-25 DIAGNOSIS — M009 Pyogenic arthritis, unspecified: Secondary | ICD-10-CM

## 2017-11-25 DIAGNOSIS — Z95828 Presence of other vascular implants and grafts: Secondary | ICD-10-CM

## 2017-11-25 LAB — CBC
HCT: 29.5 % — ABNORMAL LOW (ref 36.0–46.0)
Hemoglobin: 9.3 g/dL — ABNORMAL LOW (ref 12.0–15.0)
MCH: 28.7 pg (ref 26.0–34.0)
MCHC: 31.5 g/dL (ref 30.0–36.0)
MCV: 91 fL (ref 78.0–100.0)
Platelets: 562 10*3/uL — ABNORMAL HIGH (ref 150–400)
RBC: 3.24 MIL/uL — ABNORMAL LOW (ref 3.87–5.11)
RDW: 14 % (ref 11.5–15.5)
WBC: 11.8 10*3/uL — ABNORMAL HIGH (ref 4.0–10.5)

## 2017-11-25 LAB — BASIC METABOLIC PANEL
Anion gap: 9 (ref 5–15)
BUN: 12 mg/dL (ref 6–20)
CO2: 27 mmol/L (ref 22–32)
Calcium: 9.8 mg/dL (ref 8.9–10.3)
Chloride: 103 mmol/L (ref 101–111)
Creatinine, Ser: 0.73 mg/dL (ref 0.44–1.00)
GFR calc Af Amer: >60 mL/min (ref >60)
GFR calc non Af Amer: >60 mL/min (ref >60)
Glucose, Bld: 177 mg/dL — ABNORMAL HIGH (ref 65–99)
Potassium: 4.5 mmol/L (ref 3.5–5.1)
Sodium: 139 mmol/L (ref 135–145)

## 2017-11-25 MED ORDER — ONDANSETRON HCL 4 MG PO TABS: 4.0000 mg | ORAL_TABLET | Freq: Four times a day (QID) | ORAL | 0 refills | Status: DC | PRN

## 2017-11-25 MED ORDER — OXYCODONE HCL 10 MG PO TABS: 10.0000 mg | ORAL_TABLET | ORAL | 0 refills | Status: DC | PRN

## 2017-11-25 MED ORDER — TRAMADOL HCL 50 MG PO TABS: 50.0000 mg | ORAL_TABLET | Freq: Four times a day (QID) | ORAL | 0 refills | Status: DC | PRN

## 2017-11-25 MED ORDER — HEPARIN SOD (PORK) LOCK FLUSH 100 UNIT/ML IV SOLN
250.0000 [IU] | INTRAVENOUS | Status: AC | PRN
  Administered 2017-11-25: 250 [IU]

## 2017-11-25 MED ORDER — METHOCARBAMOL 500 MG PO TABS: 500.0000 mg | ORAL_TABLET | Freq: Four times a day (QID) | ORAL | 0 refills | Status: DC | PRN

## 2017-11-25 MED ORDER — RIVAROXABAN 10 MG PO TABS: 10.0000 mg | ORAL_TABLET | Freq: Every day | ORAL | 0 refills | Status: DC

## 2017-11-25 MED ORDER — SODIUM CHLORIDE 0.9 % IV SOLN: 2.0000 g | INTRAVENOUS | 0 refills | Status: DC

## 2017-11-25 NOTE — Discharge Summary (Signed)
Physician Discharge Summary   [name] MRN: [id number] DOB/AGE: Jan 12, 1951 67 y.o.  Admit date: 11/23/2017 Discharge date: 11-25-2017  Primary Diagnosis: Infected Right total knee arthroplasty    Admission Diagnoses:  Past Medical History:  Diagnosis Date  . Allergic rhinitis   . Degenerative disc disease, cervical   . Diabetes mellitus without complication ([place])    type two  . Diarrhea 04/2012   colon nl, BX NPD; no response to cholestyramin 03-2012 no response to align; transient resp to sucralfate 12-13  . H/O CT scan of abdomen 08/2012   neg; egd bile reflux gastritisand linear eros; transient resp to sucralfate   . H/O ulcer disease   . Heart murmur    hx of " comes and goes"   . History of blood transfusion   . Hyperlipidemia   . Hyperplastic polyps of stomach   . Hypertension   . Hypothyroidism   . IBS (irritable bowel syndrome)   . Kissing, osteophytes    Dr Carloyn Manner  . Menopause   . Mild anemia 06/2011   14% iron sat,ferritin 14- from Metformin - anemia resolved   . Obesity   . Osteoarthritis   . PONV (postoperative nausea and vomiting)    years ago    Discharge Diagnoses:   Principal Problem:   Septic arthritis of knee, right ([place])  Estimated body mass index is 34.95 kg/m as calculated from the following:   Height as of this encounter: '5\' 5"'  (1.651 m).   Weight as of this encounter: 95.3 kg (210 lb).  Procedure:  Procedure(s) (LRB): Right knee resection arthroplasty; antibiotic spacer (Right)   Consults: ID  HPI: Sheri [name] is a 67 year old female had a right total knee done in [place] several years ago.  This past summer of 2018 she presented acutely with an infection which was treated with open irrigation, debridement and polyethylene exchange.  This was followed by IV antibiotics and then oral antibiotics.  She did well until recently when she developed a painful swollen knee.  This was in the setting of a urinary tract  infection.  Initial aspiration showed no bacteria, but recent aspiration last week showed group B strep, which was consistent with her initial infection.  She presents now for resection, arthroplasty, and antibiotic spacer.   Laboratory Data: Admission on 11/23/2017  Component Date Value Ref Range Status  . ABO/RH(D) 11/23/2017 A POS   Final  . Antibody Screen 11/23/2017 NEG   Final  . Sample Expiration 11/23/2017    Final                   Value:11/26/2017 Performed at [facility], [place] [address] [place], [place] [address]   . Hgb A1c MFr Bld 11/23/2017 6.1* 4.8 - 5.6 % Final   Comment: (NOTE) Pre diabetes:          5.7%-6.4% Diabetes:              >6.4% Glycemic control for   <7.0% adults with diabetes   . Mean Plasma Glucose 11/23/2017 128.37  mg/dL Final   Performed at [facility] [address] [place], [place] [address]  . Sodium 11/23/2017 139  135 - 145 mmol/L Final  . Potassium 11/23/2017 4.3  3.5 - 5.1 mmol/L Final  . Chloride 11/23/2017 103  101 - 111 mmol/L Final  . CO2 11/23/2017 26  22 - 32 mmol/L Final  . Glucose, Bld 11/23/2017 113* 65 - 99 mg/dL  Final  . BUN 11/23/2017 13  6 - 20 mg/dL Final  . Creatinine, Ser 11/23/2017 0.77  0.44 - 1.00 mg/dL Final  . Calcium 11/23/2017 9.8  8.9 - 10.3 mg/dL Final  . GFR calc non Af Amer 11/23/2017 >60  >60 mL/min Final  . GFR calc Af Amer 11/23/2017 >60  >60 mL/min Final   Comment: (NOTE) The eGFR has been calculated using the CKD EPI equation. This calculation has not been validated in all clinical situations. eGFR's persistently <60 mL/min signify possible Chronic Kidney Disease.   Georgiann Hahn gap 11/23/2017 10  5 - 15 Final   Performed at [facility], [place] [address] [place], [place] [address]  . Glucose-Capillary 11/23/2017 113* 65 - 99 mg/dL Final  . MRSA, PCR 11/23/2017 NEGATIVE  NEGATIVE Final  . Staphylococcus aureus 11/23/2017 NEGATIVE  NEGATIVE Final   Comment:  (NOTE) The Xpert SA Assay (FDA approved for NASAL specimens in patients 69 years of age and older), is one component of a comprehensive surveillance program. It is not intended to diagnose infection nor to guide or monitor treatment. Performed at [facility], [place] [address], [place], [place] [address]   . Specimen Description 11/23/2017    Final                   Value:SYNOVIAL KNEE RIGHT Performed at [facility], [place] [address], [place], [place] [address]   . Special Requests 11/23/2017    Final                   Value:NONE Performed at [facility], [place] [address] [place], [place] [address]   . Gram Stain 11/23/2017    Final                   Value:MODERATE WBC PRESENT, PREDOMINANTLY PMN NO ORGANISMS SEEN Performed at [facility], [place] [address] [place], [place] [address]   . Culture 11/23/2017 PENDING   Incomplete  . Report Status 11/23/2017 PENDING   Incomplete  . Glucose-Capillary 11/23/2017 102* 65 - 99 mg/dL Final  . Comment 1 11/23/2017 Document in Chart   Final  . WBC 11/24/2017 10.7* 4.0 - 10.5 K/uL Final  . RBC 11/24/2017 3.56* 3.87 - 5.11 MIL/uL Final  . Hemoglobin 11/24/2017 10.1* 12.0 - 15.0 g/dL Final  . HCT 11/24/2017 32.2* 36.0 - 46.0 % Final  . MCV 11/24/2017 90.4  78.0 - 100.0 fL Final  . MCH 11/24/2017 28.4  26.0 - 34.0 pg Final  . MCHC 11/24/2017 31.4  30.0 - 36.0 g/dL Final  . RDW 11/24/2017 13.7  11.5 - 15.5 % Final  . Platelets 11/24/2017 584* 150 - 400 K/uL Final   Performed at [facility], [place] [address], [place],  [address]  . Sodium 11/24/2017 136  135 - 145 mmol/L Final  . Potassium 11/24/2017 4.5  3.5 - 5.1 mmol/L Final  . Chloride 11/24/2017 101  101 - 111 mmol/L Final  . CO2 11/24/2017 26  22 - 32 mmol/L Final  . Glucose, Bld 11/24/2017 220* 65 - 99 mg/dL Final  . BUN 11/24/2017 10  6 - 20 mg/dL Final  . Creatinine, Ser 11/24/2017 0.72  0.44 - 1.00  mg/dL Final  . Calcium 11/24/2017 9.1  8.9 - 10.3 mg/dL Final  . GFR calc non Af Amer 11/24/2017 >60  >60 mL/min Final  . GFR calc Af Amer 11/24/2017 >60  >60  mL/min Final   Comment: (NOTE) The eGFR has been calculated using the CKD EPI equation. This calculation has not been validated in all clinical situations. eGFR's persistently <60 mL/min signify possible Chronic Kidney Disease.   Georgiann Hahn gap 11/24/2017 9  5 - 15 Final   Performed at [facility], [place] [address] [place], [place] [address]  . Sed Rate 11/24/2017 77* 0 - 22 mm/hr Final   Performed at [facility] [address], [place], [place] [address]  . CRP 11/24/2017 2.9* <1.0 mg/dL Final   Performed at [facility] [address], [place], [place] [address]  . WBC 11/25/2017 11.8* 4.0 - 10.5 K/uL Final  . RBC 11/25/2017 3.24* 3.87 - 5.11 MIL/uL Final  . Hemoglobin 11/25/2017 9.3* 12.0 - 15.0 g/dL Final  . HCT 11/25/2017 29.5* 36.0 - 46.0 % Final  . MCV 11/25/2017 91.0  78.0 - 100.0 fL Final  . MCH 11/25/2017 28.7  26.0 - 34.0 pg Final  . MCHC 11/25/2017 31.5  30.0 - 36.0 g/dL Final  . RDW 11/25/2017 14.0  11.5 - 15.5 % Final  . Platelets 11/25/2017 562* 150 - 400 K/uL Final   Performed at [facility], [place] [address] [place], [place] [address]  . Sodium 11/25/2017 139  135 - 145 mmol/L Final  . Potassium 11/25/2017 4.5  3.5 - 5.1 mmol/L Final  . Chloride 11/25/2017 103  101 - 111 mmol/L Final  . CO2 11/25/2017 27  22 - 32 mmol/L Final  . Glucose, Bld 11/25/2017 177* 65 - 99 mg/dL Final  . BUN 11/25/2017 12  6 - 20 mg/dL Final  . Creatinine, Ser 11/25/2017 0.73  0.44 - 1.00 mg/dL Final  . Calcium 11/25/2017 9.8  8.9 - 10.3 mg/dL Final  . GFR calc non Af Amer 11/25/2017 >60  >60 mL/min Final  . GFR calc Af Amer 11/25/2017 >60  >60 mL/min Final   Comment: (NOTE) The eGFR has been calculated using the CKD EPI equation. This calculation has not been  validated in all clinical situations. eGFR's persistently <60 mL/min signify possible Chronic Kidney Disease.   Georgiann Hahn gap 11/25/2017 9  5 - 15 Final   Performed at [facility], [place] [address] [place], [place] [address]     X-Rays:Us Ekg Site Rite  Result Date: 11/24/2017 If [facility] image not attached, placement could not be confirmed due to current cardiac rhythm.   EKG: Orders placed or performed during the hospital encounter of 11/23/17  . EKG 12 lead  . EKG 12 lead  . EKG 12-Lead  . EKG 12-Lead     Hospital Course: DALYLA CHUI is a 67 y.o. who was admitted to [facility]. They were brought to the operating room on 11/23/2017 and underwent Procedure(s): Right knee resection arthroplasty; antibiotic spacer.  Patient tolerated the procedure well and was later transferred to the recovery room and then to the orthopaedic floor for postoperative care.  They were given PO and IV analgesics for pain control following their surgery.  They were given 24 hours of postoperative antibiotics of  Anti-infectives (From admission, onward)   Start     Dose/Rate Route Frequency Ordered Stop   11/25/17 0000  cefTRIAXone 2 g in sodium chloride 0.9 % 100 mL     2 g 200 mL/hr over 30 Minutes Intravenous Every 24 hours 11/25/17 1354     11/24/17 1700  cefTRIAXone (ROCEPHIN) 2 g in  sodium chloride 0.9 % 100 mL IVPB     2 g 200 mL/hr over 30 Minutes Intravenous Every 24 hours 11/24/17 1645     11/23/17 2300  ceFAZolin (ANCEF) IVPB 2g/100 mL premix  Status:  Discontinued     2 g 200 mL/hr over 30 Minutes Intravenous Every 6 hours 11/23/17 2027 11/23/17 2037   11/23/17 2200  ceFAZolin (ANCEF) IVPB 2g/100 mL premix  Status:  Discontinued     2 g 200 mL/hr over 30 Minutes Intravenous Every 8 hours 11/23/17 2037 11/24/17 1645   11/23/17 1500  ceFAZolin (ANCEF) IVPB 2g/100 mL premix     2 g 200 mL/hr over 30 Minutes Intravenous On call to O.R. 11/23/17 1439 11/23/17  1645     and started on DVT prophylaxis in the form of Xarelto.   PT and OT were ordered for total joint protocol.  Discharge planning consulted to help with postop disposition and equipment needs.  Patient had a tough night on the evening of surgery with pain.  They started to get up OOB with therapy on day one. Hemovac drain was left in place on day one and then pulled on day two.  ID consulted to assist with treatment plan.  Patient seen in consultation by Dr. Johnnye Sima. ABX changed to ceftriaxone.  Continued to work with therapy into day two. PICC line was ordered. Dressing was changed on day two and the incision was healing well.  Patient was seen in rounds on day two.  Once all the after hospital care was arranged, then the patient was ready to go home.  Diet - Cardiac diet and Diabetic diet Follow up - in 2 weeks with [name] Follow up - in 2 weeks in the ID clinic with Dr. Johnnye Sima or Dr. Linus Salmons Activity - WBAT Disposition - Home Condition Upon Discharge - Stable D/C Meds - See DC Summary DVT Prophylaxis - Xarelto     Discharge Instructions    Call MD / Call 911   Complete by:  As directed    If you experience chest pain or shortness of breath, CALL 911 and be transported to the hospital emergency room.  If you develope a fever above 101 F, pus (white drainage) or increased drainage or redness at the wound, or calf pain, call your surgeon's office.   Change dressing   Complete by:  As directed    Change dressing daily with sterile 4 x 4 inch gauze dressing and apply TED hose. Do not submerge the incision under water.   Constipation Prevention   Complete by:  As directed    Drink plenty of fluids.  Prune juice may be helpful.  You may use a stool softener, such as Colace (over the counter) 100 mg twice a day.  Use MiraLax (over the counter) for constipation as needed.   Diet - low sodium heart healthy   Complete by:  As directed    Diet Carb Modified   Complete by:  As directed      Discharge instructions   Complete by:  As directed    Take Xarelto for two and a half more weeks, then discontinue Xarelto. Once the patient has completed the Xarelto, they may resume the 81 mg Aspirin.   Pick up stool softner and laxative for home use following surgery while on pain medications. Do not submerge incision under water. Please use good hand washing techniques while changing dressing each day. May shower starting three days after surgery. Please use  a clean towel to pat the incision dry following showers. Continue to use ice for pain and swelling after surgery. Do not use any lotions or creams on the incision until instructed by your surgeon.  Wear both TED hose on both legs during the day every day for three weeks, but may remove the TED hose at night at home.  Postoperative Constipation Protocol  Constipation - defined medically as fewer than three stools per week and severe constipation as less than one stool per week.  One of the most common issues patients have following surgery is constipation.  Even if you have a regular bowel pattern at home, your normal regimen is likely to be disrupted due to multiple reasons following surgery.  Combination of anesthesia, postoperative narcotics, change in appetite and fluid intake all can affect your bowels.  In order to avoid complications following surgery, here are some recommendations in order to help you during your recovery period.  Colace (docusate) - Pick up an over-the-counter form of Colace or another stool softener and take twice a day as long as you are requiring postoperative pain medications.  Take with a full glass of water daily.  If you experience loose stools or diarrhea, hold the colace until you stool forms back up.  If your symptoms do not get better within 1 week or if they get worse, check with your doctor.  Dulcolax (bisacodyl) - Pick up over-the-counter and take as directed by the product packaging as needed  to assist with the movement of your bowels.  Take with a full glass of water.  Use this product as needed if not relieved by Colace only.   MiraLax (polyethylene glycol) - Pick up over-the-counter to have on hand.  MiraLax is a solution that will increase the amount of water in your bowels to assist with bowel movements.  Take as directed and can mix with a glass of water, juice, soda, coffee, or tea.  Take if you go more than two days without a movement. Do not use MiraLax more than once per day. Call your doctor if you are still constipated or irregular after using this medication for 7 days in a row.  If you continue to have problems with postoperative constipation, please contact the office for further assistance and recommendations.  If you experience "the worst abdominal pain ever" or develop nausea or vomiting, please contact the office immediatly for further recommendations for treatment.   Do not put a pillow under the knee. Place it under the heel.   Complete by:  As directed    Do not sit on low chairs, stoools or toilet seats, as it may be difficult to get up from low surfaces   Complete by:  As directed    Driving restrictions   Complete by:  As directed    No driving until released by the physician.   Increase activity slowly as tolerated   Complete by:  As directed    Lifting restrictions   Complete by:  As directed    No lifting until released by the physician.   Patient may shower   Complete by:  As directed    You may shower without a dressing once there is no drainage.  Do not wash over the wound.  If drainage remains, do not shower until drainage stops.   TED hose   Complete by:  As directed    Use stockings (TED hose) for 3 weeks on both leg(s).  You may remove  them at night for sleeping.   Weight bearing as tolerated   Complete by:  As directed    Laterality:  right   Extremity:  Lower     Allergies as of 11/25/2017   No Known Allergies     Medication List      STOP taking these medications   amoxicillin-clavulanate 500-125 MG tablet Commonly known as:  AUGMENTIN   aspirin EC 81 MG tablet   ibuprofen 200 MG tablet Commonly known as:  ADVIL,MOTRIN   meloxicam 15 MG tablet Commonly known as:  MOBIC     TAKE these medications   acetaminophen 650 MG CR tablet Commonly known as:  TYLENOL Take 1,300 mg by mouth 2 (two) times daily.   amLODipine-benazepril 5-20 MG capsule Commonly known as:  LOTREL Take 1 capsule by mouth every morning.   cefTRIAXone 2 g in sodium chloride 0.9 % 100 mL Inject 2 g into the vein daily. Given via PICC Line   methocarbamol 500 MG tablet Commonly known as:  ROBAXIN Take 1 tablet (500 mg total) by mouth every 6 (six) hours as needed for muscle spasms.   ondansetron 4 MG tablet Commonly known as:  ZOFRAN Take 1 tablet (4 mg total) by mouth every 6 (six) hours as needed for nausea.   Oxycodone HCl 10 MG Tabs Take 1-1.5 tablets (10-15 mg total) by mouth every 4 (four) hours as needed for moderate pain or severe pain.   pantoprazole 40 MG tablet Commonly known as:  PROTONIX Take 40 mg by mouth daily.   rivaroxaban 10 MG Tabs tablet Commonly known as:  XARELTO Take 1 tablet (10 mg total) by mouth daily with breakfast. Take Xarelto for two and a half more weeks following discharge from the hospital, then discontinue Xarelto. Once the patient has completed the Xarelto, they may resume the 81 mg Aspirin. Start taking on:  11/26/2017   sitaGLIPtin 100 MG tablet Commonly known as:  JANUVIA Take 100 mg by mouth daily.   SYNTHROID 112 MCG tablet Generic drug:  levothyroxine Take 1125 mcg by mouth daily   traMADol 50 MG tablet Commonly known as:  ULTRAM Take 1-2 tablets (50-100 mg total) by mouth every 6 (six) hours as needed (mild pain). What changed:  reasons to take this            Durable Medical Equipment  (From admission, onward)        Start     Ordered   11/24/17 1135  For home use only  DME Walker rolling  Once    Question:  Patient needs a walker to treat with the following condition  Answer:  S/P knee surgery   11/24/17 1134       Discharge Care Instructions  (From admission, onward)        Start     Ordered   11/25/17 0000  Weight bearing as tolerated    Question Answer Comment  Laterality right   Extremity Lower      11/25/17 1354   11/25/17 0000  Change dressing    Comments:  Change dressing daily with sterile 4 x 4 inch gauze dressing and apply TED hose. Do not submerge the incision under water.   11/25/17 1354     Follow-up Information    [facility]   Specialty:  [place] Why:  nurse to assist with IV abx and physical therapy Contact information: [address] [place] [address] [phone]  [place] Follow up.   Why:  walker Contact information: [address] [place] [address] [phone]        Gaynelle Arabian, MD. Schedule an appointment as soon as possible for a visit on 12/08/2017.   Specialty:  Orthopedic Surgery Contact information: [address] [place] [address] [phone]           Signed: [name], PA-C Orthopaedic Surgery 11/25/2017, 1:58 PM

## 2017-11-25 NOTE — Progress Notes (Signed)
 Subjective: 2 Days Post-Op Procedure(s) (LRB): Right knee resection arthroplasty; antibiotic spacer (Right) Patient reports pain as mild.   Patient seen in rounds with Dr. Aluisio. Doing better today Patient is well, but has had some minor complaints of pain in the knee, requiring pain medications Patient is ready to go home if all arrangements are completed. PICC went in yesterday.  Switched to ceftriaxone.  Objective: Vital signs in last 24 hours: Temp:  [97.7 F (36.5 C)-98.8 F (37.1 C)] 98.2 F (36.8 C) (03/27 0815) Pulse Rate:  [65-100] 65 (03/27 0815) Resp:  [14-18] 15 (03/27 0815) BP: (127-163)/(64-82) 163/79 (03/27 0815) SpO2:  [99 %-100 %] 100 % (03/27 0815)  Intake/Output from previous day:  Intake/Output Summary (Last 24 hours) at 11/25/2017 1113 Last data filed at 11/25/2017 1010 Gross per 24 hour  Intake 1770.5 ml  Output 500 ml  Net 1270.5 ml    Intake/Output this shift: Total I/O In: 240 [P.O.:240] Out: -   Labs: Recent Labs    11/24/17 0621 11/25/17 0614  HGB 10.1* 9.3*   Recent Labs    11/24/17 0621 11/25/17 0614  WBC 10.7* 11.8*  RBC 3.56* 3.24*  HCT 32.2* 29.5*  PLT 584* 562*   Recent Labs    11/24/17 0621 11/25/17 0614  NA 136 139  K 4.5 4.5  CL 101 103  CO2 26 27  BUN 10 12  CREATININE 0.72 0.73  GLUCOSE 220* 177*  CALCIUM 9.1 9.8   No results for input(s): LABPT, INR in the last 72 hours.  EXAM: General - Patient is Alert and Appropriate Extremity - Neurovascular intact Sensation intact distally Incision - clean, dry, no drainage, healing Motor Function - intact, moving foot and toes well on exam.   Assessment/Plan: 2 Days Post-Op Procedure(s) (LRB): Right knee resection arthroplasty; antibiotic spacer (Right) Procedure(s) (LRB): Right knee resection arthroplasty; antibiotic spacer (Right) Past Medical History:  Diagnosis Date  . Allergic rhinitis   . Degenerative disc disease, cervical   . Diabetes mellitus  without complication ([facility])    type two  . Diarrhea 04/2012   colon nl, BX NPD; no response to cholestyramin 03-2012 no response to align; transient resp to sucralfate 12-13  . H/O CT scan of abdomen 08/2012   neg; egd bile reflux gastritisand linear eros; transient resp to sucralfate   . H/O ulcer disease   . Heart murmur    hx of " comes and goes"   . History of blood transfusion   . Hyperlipidemia   . Hyperplastic polyps of stomach   . Hypertension   . Hypothyroidism   . IBS (irritable bowel syndrome)   . Kissing, osteophytes    Dr Roy  . Menopause   . Mild anemia 06/2011   14% iron sat,ferritin 14- from Metformin - anemia resolved   . Obesity   . Osteoarthritis   . PONV (postoperative nausea and vomiting)    years ago    Principal Problem:   Septic arthritis of knee, right ([facility])  Estimated body mass index is 34.95 kg/m as calculated from the following:   Height as of this encounter: 5' 5" (1.651 m).   Weight as of this encounter: 95.3 kg (210 lb). Up with therapy Diet - Cardiac diet and Diabetic diet Follow up - in 2 weeks with Dr. Aluisio Follow up - in 2 weeks in the ID clinic with Dr. Hatcher or Dr. Comer Activity - WBAT Disposition - Home Condition Upon Discharge - Stable D/C Meds -   See DC Summary DVT Prophylaxis - Xarelto  Drew Perkins, PA-C Orthopaedic Surgery 11/25/2017, 11:13 AM   

## 2017-11-25 NOTE — Progress Notes (Signed)
INFECTIOUS DISEASE PROGRESS NOTE  ID: Sheri [name] is a 67 y.o. female with  Principal Problem:   Septic arthritis of knee, right (HCC)  Subjective: No complaints.   Abtx:  Anti-infectives (From admission, onward)   Start     Dose/Rate Route Frequency Ordered Stop   11/25/17 0000  cefTRIAXone 2 g in sodium chloride 0.9 % 100 mL     2 g 200 mL/hr over 30 Minutes Intravenous Every 24 hours 11/25/17 1354     11/24/17 1700  cefTRIAXone (ROCEPHIN) 2 g in sodium chloride 0.9 % 100 mL IVPB     2 g 200 mL/hr over 30 Minutes Intravenous Every 24 hours 11/24/17 1645     11/23/17 2300  ceFAZolin (ANCEF) IVPB 2g/100 mL premix  Status:  Discontinued     2 g 200 mL/hr over 30 Minutes Intravenous Every 6 hours 11/23/17 2027 11/23/17 2037   11/23/17 2200  ceFAZolin (ANCEF) IVPB 2g/100 mL premix  Status:  Discontinued     2 g 200 mL/hr over 30 Minutes Intravenous Every 8 hours 11/23/17 2037 11/24/17 1645   11/23/17 1500  ceFAZolin (ANCEF) IVPB 2g/100 mL premix     2 g 200 mL/hr over 30 Minutes Intravenous On call to O.R. 11/23/17 1439 11/23/17 1645      Medications:  Scheduled: . amLODipine  5 mg Oral Daily  . docusate sodium  100 mg Oral BID  . levothyroxine  112 mcg Oral QAC breakfast  . linagliptin  5 mg Oral Daily  . pantoprazole  40 mg Oral Daily  . rivaroxaban  10 mg Oral Q breakfast    Objective: Vital signs in last 24 hours: Temp:  [98.2 F (36.8 C)-98.8 F (37.1 C)] 98.5 F (36.9 C) (03/27 1412) Pulse Rate:  [65-100] 79 (03/27 1412) Resp:  [15-18] 17 (03/27 1412) BP: (127-163)/(59-79) 130/59 (03/27 1412) SpO2:  [99 %-100 %] 100 % (03/27 1412)   General appearance: alert, cooperative and no distress Resp: clear to auscultation bilaterally Cardio: regular rate and rhythm GI: normal findings: bowel sounds normal and soft, non-tender Extremities: RLE wrapped. RUE PIC clean.   Lab Results Recent Labs    11/24/17 0621 11/25/17 0614  WBC 10.7* 11.8*  HGB  10.1* 9.3*  HCT 32.2* 29.5*  NA 136 139  K 4.5 4.5  CL 101 103  CO2 26 27  BUN 10 12  CREATININE 0.72 0.73   Liver Panel No results for input(s): PROT, ALBUMIN, AST, ALT, ALKPHOS, BILITOT, BILIDIR, IBILI in the last 72 hours. Sedimentation Rate Recent Labs    11/24/17 1739  ESRSEDRATE 77*   C-Reactive Protein Recent Labs    11/24/17 1739  CRP 2.9*    Microbiology: Recent Results (from the past 240 hour(s))  Surgical pcr screen     Status: None   Collection Time: 11/23/17  2:51 PM  Result Value Ref Range Status   MRSA, PCR NEGATIVE NEGATIVE Final   Staphylococcus aureus NEGATIVE NEGATIVE Final    Comment: (NOTE) The Xpert SA Assay (FDA approved for NASAL specimens in patients 19 years of age and older), is one component of a comprehensive surveillance program. It is not intended to diagnose infection nor to guide or monitor treatment. Performed at [facility], [place] [address], [place], [place] [address]   Aerobic/Anaerobic Culture (surgical/deep wound)     Status: None (Preliminary result)   Collection Time: 11/23/17  5:06 PM  Result Value Ref Range Status   Specimen Description  Final    SYNOVIAL KNEE RIGHT Performed at [facility] [address], [place], [place] [address]    Special Requests   Final    NONE Performed at [facility], [place] [address], [place], [place] [address]    Gram Stain   Final    MODERATE WBC PRESENT, PREDOMINANTLY PMN NO ORGANISMS SEEN    Culture   Final    NO GROWTH 2 DAYS Performed at [facility] [address] [place], [place] [address]    Report Status PENDING  Incomplete    Studies/Results: [place] Ekg Site Rite  Result Date: 11/24/2017 If Site Rite image not attached, placement could not be confirmed due to current cardiac rhythm.    Assessment/Plan: Infected R TKR (pan-sens GBS) Anbx spacer placed 3-25 DM2 (A1C 6.1%) L TKA Total days of antibiotics: 2  ceftriaxone  CRP 2.9 and ESR 77 opat consult placed yesterday She is set for d/c Will have her seen in ID clinic in next 2-3 weeks.    No Known Allergies  OPAT Orders Discharge antibiotics: ceftriaxone 2 g IVPB q24h Duration: 42 days End Date: Jan 04, 2018  [facility] Per Protocol: please  Labs weekly while on IV antibiotics: _x_ CBC with differential __ BMP _x_ CMP _x_ CRP _x_ ESR   _x_ Please pull PIC at completion of IV antibiotics __ Please leave PIC in place until doctor has seen patient or been notified  Fax weekly labs to [phone]  Clinic Follow Up Appt: [place] or [facility] Rademaker 2-3 weeks         Bobby Rumpf MD, FACP Infectious Diseases (pager) [phone] [email or web address] 11/25/2017, 3:08 PM  LOS: 2 days

## 2017-11-25 NOTE — Progress Notes (Signed)
Physical Therapy Treatment Patient Details Name: Sheri [name] MRN: [id number] DOB: 07-08-1951 Today's Date: 11/25/2017    History of Present Illness s/p R TKA resection with placement of articulating Abx spacerPMHx: L TKA     PT Comments    Pt progressing well, motivated to d/c today   Follow Up Recommendations  Follow surgeon's recommendation for DC plan and follow-up therapies     Equipment Recommendations  Rolling walker with 5" wheels    Recommendations for Other Services       Precautions / Restrictions Precautions Precautions: Knee;Fall Required Braces or Orthoses: Knee Immobilizer - Right Restrictions Weight Bearing Restrictions: No RLE Weight Bearing: Weight bearing as tolerated    Mobility  Bed Mobility Overal bed mobility: Needs Assistance Bed Mobility: Supine to Sit     Supine to sit: Supervision     General bed mobility comments: instructed in sheet loop  Transfers Overall transfer level: Needs assistance Equipment used: Rolling walker (2 wheeled) Transfers: Sit to/from Stand Sit to Stand: Min guard;Supervision         General transfer comment: cues for hand placement  Ambulation/Gait Ambulation/Gait assistance: Min guard Ambulation Distance (Feet): 140 Feet Assistive device: Rolling walker (2 wheeled) Gait Pattern/deviations: Step-to pattern;Step-through pattern;Decreased stride length Gait velocity: decr   General Gait Details: cues for RW position, gait progression   Stairs            Wheelchair Mobility    Modified Rankin (Stroke Patients Only)       Balance                                            Cognition Arousal/Alertness: Awake/alert Behavior During Therapy: WFL for tasks assessed/performed Overall Cognitive Status: Within Functional Limits for tasks assessed                                        Exercises Total Joint Exercises Ankle Circles/Pumps: AROM;Both;10  reps Quad Sets: AROM;Both;10 reps    General Comments        Pertinent Vitals/Pain Pain Assessment: 0-10 Pain Score: 2  Pain Location: right knee Pain Descriptors / Indicators: Sore Pain Intervention(s): Monitored during session    Home Living                      Prior Function            PT Goals (current goals can now be found in the care plan section) Acute Rehab PT Goals Patient Stated Goal: get over this PT Goal Formulation: With patient Time For Goal Achievement: 11/24/17 Potential to Achieve Goals: Good Progress towards PT goals: Progressing toward goals    Frequency    7X/week      PT Plan Current plan remains appropriate    Co-evaluation              AM-PAC PT "6 Clicks" Daily Activity  Outcome Measure  Difficulty turning over in bed (including adjusting bedclothes, sheets and blankets)?: A Little Difficulty moving from lying on back to sitting on the side of the bed? : A Little Difficulty sitting down on and standing up from a chair with arms (e.g., wheelchair, bedside commode, etc,.)?: A Little Help needed moving to and from a bed to chair (including a  wheelchair)?: A Little Help needed walking in hospital room?: A Little Help needed climbing 3-5 steps with a railing? : A Little 6 Click Score: 18    End of Session Equipment Utilized During Treatment: Gait belt;Right knee immobilizer Activity Tolerance: Patient tolerated treatment well Patient left: in chair;with call bell/phone within reach;with chair alarm set;with family/visitor present   PT Visit Diagnosis: Difficulty in walking, not elsewhere classified (R26.2)     Time: 1165-7903 PT Time Calculation (min) (ACUTE ONLY): 34 min  Charges:  $Gait Training: 23-37 mins                    G CodesKenyon Ana, PT Pager: [phone] 11/25/2017    Kenyon Ana 11/25/2017, 2:14 PM

## 2017-11-25 NOTE — Discharge Instructions (Signed)
Dr. Gaynelle Arabian Total Joint Specialist [facility] [address], [place], [place] [address] [phone]  KNEE POSTOPERATIVE DIRECTIONS  Knee Rehabilitation, Guidelines Following Surgery  Results after knee surgery are often greatly improved when you follow the exercise, range of motion and muscle strengthening exercises prescribed by your doctor. Safety measures are also important to protect the knee from further injury. Any time any of these exercises cause you to have increased pain or swelling in your knee joint, decrease the amount until you are comfortable again and slowly increase them. If you have problems or questions, call your caregiver or physical therapist for advice.   HOME CARE INSTRUCTIONS  Remove items at home which could result in a fall. This includes throw rugs or furniture in walking pathways.   ICE to the affected knee every three hours for 30 minutes at a time and then as needed for pain and swelling.  Continue to use ice on the knee for pain and swelling from surgery. You may notice swelling that will progress down to the foot and ankle.  This is normal after surgery.  Elevate the leg when you are not up walking on it.    Continue to use the breathing machine which will help keep your temperature down.  It is common for your temperature to cycle up and down following surgery, especially at night when you are not up moving around and exerting yourself.  The breathing machine keeps your lungs expanded and your temperature down.  Do not place pillow under knee, focus on keeping the knee straight while resting  DIET You may resume your previous home diet once your are discharged from the hospital.  DRESSING / WOUND CARE / SHOWERING You may shower 3 days after surgery, but keep the wounds dry during showering.  You may use an occlusive plastic wrap (Press'n Seal for example), NO SOAKING/SUBMERGING IN THE BATHTUB.  If the bandage gets wet, change with a  clean dry gauze.  If the incision gets wet, pat the wound dry with a clean towel. You may start showering once you are discharged home but do not submerge the incision under water. Just pat the incision dry and apply a dry gauze dressing on daily. Change the surgical dressing daily and reapply a dry dressing each time.  ACTIVITY Walk with your walker as instructed. Use walker as long as suggested by your caregivers. Avoid periods of inactivity such as sitting longer than an hour when not asleep. This helps prevent blood clots.  You may resume a sexual relationship in one month or when given the OK by your doctor.  You may return to work once you are cleared by your doctor.  Do not drive a car for 6 weeks or until released by you surgeon.  Do not drive while taking narcotics.  WEIGHT BEARING Weight bearing as tolerated with assist device (walker, cane, etc) as directed, use it as long as suggested by your surgeon or therapist, typically at least 4-6 weeks.  POSTOPERATIVE CONSTIPATION PROTOCOL Constipation - defined medically as fewer than three stools per week and severe constipation as less than one stool per week.  One of the most common issues patients have following surgery is constipation.  Even if you have a regular bowel pattern at home, your normal regimen is likely to be disrupted due to multiple reasons following surgery.  Combination of anesthesia, postoperative narcotics, change in appetite and fluid intake all can affect your bowels.  In order  to avoid complications following surgery, here are some recommendations in order to help you during your recovery period.  Colace (docusate) - Pick up an over-the-counter form of Colace or another stool softener and take twice a day as long as you are requiring postoperative pain medications.  Take with a full glass of water daily.  If you experience loose stools or diarrhea, hold the colace until you stool forms back up.  If your symptoms do  not get better within 1 week or if they get worse, check with your doctor.  Dulcolax (bisacodyl) - Pick up over-the-counter and take as directed by the product packaging as needed to assist with the movement of your bowels.  Take with a full glass of water.  Use this product as needed if not relieved by Colace only.   MiraLax (polyethylene glycol) - Pick up over-the-counter to have on hand.  MiraLax is a solution that will increase the amount of water in your bowels to assist with bowel movements.  Take as directed and can mix with a glass of water, juice, soda, coffee, or tea.  Take if you go more than two days without a movement. Do not use MiraLax more than once per day. Call your doctor if you are still constipated or irregular after using this medication for 7 days in a row.  If you continue to have problems with postoperative constipation, please contact the office for further assistance and recommendations.  If you experience "the worst abdominal pain ever" or develop nausea or vomiting, please contact the office immediatly for further recommendations for treatment.  ITCHING  If you experience itching with your medications, try taking only a single pain pill, or even half a pain pill at a time.  You can also use Benadryl over the counter for itching or also to help with sleep.   TED HOSE STOCKINGS Wear the elastic stockings on both legs for three weeks following surgery during the day but you may remove then at night for sleeping.  MEDICATIONS See your medication summary on the After Visit Summary that the nursing staff will review with you prior to discharge.  You may have some home medications which will be placed on hold until you complete the course of blood thinner medication.  It is important for you to complete the blood thinner medication as prescribed by your surgeon.  Continue your approved medications as instructed at time of discharge.  PRECAUTIONS If you experience chest pain  or shortness of breath - call 911 immediately for transfer to the hospital emergency department.  If you develop a fever greater that 101 F, purulent drainage from wound, increased redness or drainage from wound, foul odor from the wound/dressing, or calf pain - CONTACT YOUR SURGEON.                                                   FOLLOW-UP APPOINTMENTS Make sure you keep all of your appointments after your operation with your surgeon and caregivers. You should call the office at the above phone number and make an appointment for approximately two weeks after the date of your surgery or on the date instructed by your surgeon outlined in the "After Visit Summary".   RANGE OF MOTION AND STRENGTHENING EXERCISES  Rehabilitation of the knee is important following a knee injury or an operation.  After just a few days of immobilization, the muscles of the thigh which control the knee become weakened and shrink (atrophy). Knee exercises are designed to build up the tone and strength of the thigh muscles and to improve knee motion.  Leg Lifts - While your knee is still immobilized in a splint or cast, you can do straight leg raises. Lift the leg to 60 degrees, hold for 3 sec, and slowly lower the leg. Repeat 10-20 times 2-3 times daily. Perform this exercise against resistance later as your knee gets better.  Quad and Hamstring Sets - Tighten up the muscle on the front of the thigh (Quad) and hold for 5-10 sec. Repeat this 10-20 times hourly. Hamstring sets are done by pushing the foot backward against an object and holding for 5-10 sec. Repeat as with quad sets.   Angel Wings: Lying on your back spread your legs to the side as far apart as you can without causing discomfort.  A rehabilitation program following serious knee injuries can speed recovery and prevent re-injury in the future due to weakened muscles. Contact your doctor or a physical therapist for more information on knee rehabilitation.   IF YOU  ARE TRANSFERRED TO A SKILLED REHAB FACILITY If the patient is transferred to a skilled rehab facility following release from the hospital, a list of the current medications will be sent to the facility for the patient to continue.  When discharged from the skilled rehab facility, please have the facility set up the patient's [place] prior to being released. Also, the skilled facility will be responsible for providing the patient with their medications at time of release from the facility to include their pain medication, the muscle relaxants, and their blood thinner medication. If the patient is still at the rehab facility at time of the two week follow up appointment, the skilled rehab facility will also need to assist the patient in arranging follow up appointment in our office and any transportation needs.  MAKE SURE YOU:  Understand these instructions.  Get help right away if you are not doing well or get worse.    Pick up stool softner and laxative for home use following surgery while on pain medications. Do not submerge incision under water. Please use good hand washing techniques while changing dressing each day. May shower starting three days after surgery. Please use a clean towel to pat the incision dry following showers. Continue to use ice for pain and swelling after surgery. Do not use any lotions or creams on the incision until instructed by your surgeon.  Take Xarelto for two and a half more weeks following discharge from the hospital, then discontinue Xarelto. Once the patient has completed the Xarelto, they may resume the 81 mg Aspirin.    Information on my medicine - XARELTO (Rivaroxaban)  This medication education was reviewed with me or my healthcare representative as part of my discharge preparation.  The pharmacist that spoke with me during my hospital stay was:    Why was Xarelto prescribed for you? Xarelto was prescribed for you to reduce the  risk of blood clots forming after orthopedic surgery. The medical term for these abnormal blood clots is venous thromboembolism (VTE).  What do you need to know about xarelto ? Take your Xarelto ONCE DAILY at the same time every day. You may take it either with or without food.  If you have difficulty swallowing the tablet whole, you may crush it and mix in  applesauce just prior to taking your dose.  Take Xarelto exactly as prescribed by your doctor and DO NOT stop taking Xarelto without talking to the doctor who prescribed the medication.  Stopping without other VTE prevention medication to take the place of Xarelto may increase your risk of developing a clot.  After discharge, you should have regular check-up appointments with your healthcare provider that is prescribing your Xarelto.    What do you do if you miss a dose? If you miss a dose, take it as soon as you remember on the same day then continue your regularly scheduled once daily regimen the next day. Do not take two doses of Xarelto on the same day.   Important Safety Information A possible side effect of Xarelto is bleeding. You should call your healthcare provider right away if you experience any of the following: ? Bleeding from an injury or your nose that does not stop. ? Unusual colored urine (red or dark brown) or unusual colored stools (red or black). ? Unusual bruising for unknown reasons. ? A serious fall or if you hit your head (even if there is no bleeding).  Some medicines may interact with Xarelto and might increase your risk of bleeding while on Xarelto. To help avoid this, consult your healthcare provider or pharmacist prior to using any new prescription or non-prescription medications, including herbals, vitamins, non-steroidal anti-inflammatory drugs (NSAIDs) and supplements.  This website has more information on Xarelto: [email or web address]

## 2017-11-25 NOTE — Plan of Care (Signed)
Managing pain better, good compliance with incentive spirometer. Plan of care discussed.

## 2017-11-26 DIAGNOSIS — E119 Type 2 diabetes mellitus without complications: Secondary | ICD-10-CM | POA: Diagnosis not present

## 2017-11-26 DIAGNOSIS — E669 Obesity, unspecified: Secondary | ICD-10-CM | POA: Diagnosis not present

## 2017-11-26 DIAGNOSIS — M5090 Cervical disc disorder, unspecified, unspecified cervical region: Secondary | ICD-10-CM | POA: Diagnosis not present

## 2017-11-26 DIAGNOSIS — Z96652 Presence of left artificial knee joint: Secondary | ICD-10-CM | POA: Diagnosis not present

## 2017-11-26 DIAGNOSIS — E039 Hypothyroidism, unspecified: Secondary | ICD-10-CM | POA: Diagnosis not present

## 2017-11-26 DIAGNOSIS — M00261 Other streptococcal arthritis, right knee: Secondary | ICD-10-CM | POA: Diagnosis not present

## 2017-11-26 DIAGNOSIS — Z5181 Encounter for therapeutic drug level monitoring: Secondary | ICD-10-CM | POA: Diagnosis not present

## 2017-11-26 DIAGNOSIS — D649 Anemia, unspecified: Secondary | ICD-10-CM | POA: Diagnosis not present

## 2017-11-26 DIAGNOSIS — T8453XA Infection and inflammatory reaction due to internal right knee prosthesis, initial encounter: Secondary | ICD-10-CM | POA: Diagnosis not present

## 2017-11-26 DIAGNOSIS — I10 Essential (primary) hypertension: Secondary | ICD-10-CM | POA: Diagnosis not present

## 2017-11-26 DIAGNOSIS — M1990 Unspecified osteoarthritis, unspecified site: Secondary | ICD-10-CM | POA: Diagnosis not present

## 2017-11-26 DIAGNOSIS — Z452 Encounter for adjustment and management of vascular access device: Secondary | ICD-10-CM | POA: Diagnosis not present

## 2017-11-26 DIAGNOSIS — K589 Irritable bowel syndrome without diarrhea: Secondary | ICD-10-CM | POA: Diagnosis not present

## 2017-11-26 DIAGNOSIS — N390 Urinary tract infection, site not specified: Secondary | ICD-10-CM | POA: Diagnosis not present

## 2017-11-26 DIAGNOSIS — E7800 Pure hypercholesterolemia, unspecified: Secondary | ICD-10-CM | POA: Diagnosis not present

## 2017-11-26 DIAGNOSIS — B955 Unspecified streptococcus as the cause of diseases classified elsewhere: Secondary | ICD-10-CM | POA: Diagnosis not present

## 2017-11-27 NOTE — Anesthesia Postprocedure Evaluation (Signed)
Anesthesia Post Note  Patient: Sheri [name]  Procedure(s) Performed: Right knee resection arthroplasty; antibiotic spacer (Right Knee)     Patient location during evaluation: PACU Anesthesia Type: General Level of consciousness: awake and alert Pain management: pain level controlled Vital Signs Assessment: post-procedure vital signs reviewed and stable Respiratory status: spontaneous breathing, nonlabored ventilation, respiratory function stable and patient connected to nasal cannula oxygen Cardiovascular status: blood pressure returned to baseline and stable Postop Assessment: no apparent nausea or vomiting Anesthetic complications: no    Last Vitals:  Vitals:   11/25/17 0815 11/25/17 1412  BP: (!) 163/79 (!) 130/59  Pulse: 65 79  Resp: 15 17  Temp: 36.8 C 36.9 C  SpO2: 100% 100%    Last Pain:  Vitals:   11/25/17 1515  TempSrc:   PainSc: 4                  Lakynn Halvorsen S

## 2017-11-28 DIAGNOSIS — M00261 Other streptococcal arthritis, right knee: Secondary | ICD-10-CM | POA: Diagnosis not present

## 2017-11-28 DIAGNOSIS — B955 Unspecified streptococcus as the cause of diseases classified elsewhere: Secondary | ICD-10-CM | POA: Diagnosis not present

## 2017-11-28 DIAGNOSIS — E119 Type 2 diabetes mellitus without complications: Secondary | ICD-10-CM | POA: Diagnosis not present

## 2017-11-28 DIAGNOSIS — M5090 Cervical disc disorder, unspecified, unspecified cervical region: Secondary | ICD-10-CM | POA: Diagnosis not present

## 2017-11-28 DIAGNOSIS — T8453XA Infection and inflammatory reaction due to internal right knee prosthesis, initial encounter: Secondary | ICD-10-CM | POA: Diagnosis not present

## 2017-11-28 DIAGNOSIS — N390 Urinary tract infection, site not specified: Secondary | ICD-10-CM | POA: Diagnosis not present

## 2017-11-28 LAB — AEROBIC/ANAEROBIC CULTURE W GRAM STAIN (SURGICAL/DEEP WOUND): Culture: NO GROWTH

## 2017-11-30 DIAGNOSIS — B955 Unspecified streptococcus as the cause of diseases classified elsewhere: Secondary | ICD-10-CM | POA: Diagnosis not present

## 2017-11-30 DIAGNOSIS — N390 Urinary tract infection, site not specified: Secondary | ICD-10-CM | POA: Diagnosis not present

## 2017-11-30 DIAGNOSIS — T8453XA Infection and inflammatory reaction due to internal right knee prosthesis, initial encounter: Secondary | ICD-10-CM | POA: Diagnosis not present

## 2017-11-30 DIAGNOSIS — M5090 Cervical disc disorder, unspecified, unspecified cervical region: Secondary | ICD-10-CM | POA: Diagnosis not present

## 2017-11-30 DIAGNOSIS — E119 Type 2 diabetes mellitus without complications: Secondary | ICD-10-CM | POA: Diagnosis not present

## 2017-11-30 DIAGNOSIS — M00261 Other streptococcal arthritis, right knee: Secondary | ICD-10-CM | POA: Diagnosis not present

## 2017-12-01 DIAGNOSIS — M5090 Cervical disc disorder, unspecified, unspecified cervical region: Secondary | ICD-10-CM | POA: Diagnosis not present

## 2017-12-01 DIAGNOSIS — E119 Type 2 diabetes mellitus without complications: Secondary | ICD-10-CM | POA: Diagnosis not present

## 2017-12-01 DIAGNOSIS — N390 Urinary tract infection, site not specified: Secondary | ICD-10-CM | POA: Diagnosis not present

## 2017-12-01 DIAGNOSIS — B955 Unspecified streptococcus as the cause of diseases classified elsewhere: Secondary | ICD-10-CM | POA: Diagnosis not present

## 2017-12-01 DIAGNOSIS — T8453XA Infection and inflammatory reaction due to internal right knee prosthesis, initial encounter: Secondary | ICD-10-CM | POA: Diagnosis not present

## 2017-12-01 DIAGNOSIS — M00261 Other streptococcal arthritis, right knee: Secondary | ICD-10-CM | POA: Diagnosis not present

## 2017-12-03 ENCOUNTER — Institutional Professional Consult (permissible substitution): Payer: Medicare Other | Admitting: Internal Medicine

## 2017-12-03 DIAGNOSIS — B955 Unspecified streptococcus as the cause of diseases classified elsewhere: Secondary | ICD-10-CM | POA: Diagnosis not present

## 2017-12-03 DIAGNOSIS — T8453XA Infection and inflammatory reaction due to internal right knee prosthesis, initial encounter: Secondary | ICD-10-CM | POA: Diagnosis not present

## 2017-12-03 DIAGNOSIS — E119 Type 2 diabetes mellitus without complications: Secondary | ICD-10-CM | POA: Diagnosis not present

## 2017-12-03 DIAGNOSIS — N390 Urinary tract infection, site not specified: Secondary | ICD-10-CM | POA: Diagnosis not present

## 2017-12-03 DIAGNOSIS — M5090 Cervical disc disorder, unspecified, unspecified cervical region: Secondary | ICD-10-CM | POA: Diagnosis not present

## 2017-12-03 DIAGNOSIS — M00261 Other streptococcal arthritis, right knee: Secondary | ICD-10-CM | POA: Diagnosis not present

## 2017-12-04 ENCOUNTER — Ambulatory Visit (INDEPENDENT_AMBULATORY_CARE_PROVIDER_SITE_OTHER): Payer: Medicare Other | Admitting: Infectious Diseases

## 2017-12-04 ENCOUNTER — Encounter: Payer: Self-pay | Admitting: Infectious Diseases

## 2017-12-04 ENCOUNTER — Telehealth: Payer: Self-pay | Admitting: Behavioral Health

## 2017-12-04 VITALS — BP 115/76 | HR 89 | Temp 98.2°F

## 2017-12-04 DIAGNOSIS — B3731 Acute candidiasis of vulva and vagina: Secondary | ICD-10-CM

## 2017-12-04 DIAGNOSIS — T8453XD Infection and inflammatory reaction due to internal right knee prosthesis, subsequent encounter: Secondary | ICD-10-CM | POA: Diagnosis present

## 2017-12-04 DIAGNOSIS — Z5181 Encounter for therapeutic drug level monitoring: Secondary | ICD-10-CM | POA: Diagnosis not present

## 2017-12-04 DIAGNOSIS — B373 Candidiasis of vulva and vagina: Secondary | ICD-10-CM

## 2017-12-04 MED ORDER — FLUCONAZOLE 150 MG PO TABS: 150.0000 mg | ORAL_TABLET | Freq: Once | ORAL | 4 refills | Status: AC

## 2017-12-04 NOTE — Telephone Encounter (Signed)
Patient came in the office today for an office visit and stated [facility] needed an order to put a biopatch when they do a PICC line dressing changes.  Verbal order was called to Coretta in [facility].  Coretta verbalized understanding with read-back.

## 2017-12-04 NOTE — Patient Instructions (Signed)
Very nice to meet you both.   Will have you back in 4 weeks to see Dr. Linus Salmons or Dr. Johnnye Sima to discuss next steps in your treatment.   Will send in your Diflucan - take one pill and if symptoms are not better in 3 days can repeat this dose.

## 2017-12-04 NOTE — Progress Notes (Signed)
Patient: Sheri [name]  DOB: November 28, 1950 MRN: [id number] PCP: [name], MD  Referring Provider: hospital follow up  Chief Complaint  Patient presents with  . Hospitalization Follow-up     Patient Active Problem List   Diagnosis Date Noted  . Septic arthritis of knee, right ([place]) 11/23/2017  . Medication monitoring encounter 06/17/2017  . Vaginal candidiasis 03/02/2017  . Group B streptococcal infection   . Infection of total right knee replacement ([place]) 01/22/2017  . OA (osteoarthritis) of knee 02/12/2015  . Essential hypertension, benign 11/10/2013  . Pure hypercholesterolemia 11/10/2013  . Anemia 08/13/2011     Subjective:  Sheri [name] is a 67 y.o. Caucasian woman who is now post op day 11 after having her previous right total knee placement extracted by Dr. Almyra Deforest. January 4th 2019 stopped Amoxicillin after treatment of her group b strep prosthetic right knee infection. Saw Dr. Almyra Deforest in February and re-started amoxicillin as her knee began having some swelling. This progressed to fevers and UTI which she was not very symptomatic for - changed to Augmentin and felt much better. Since that time she has had 2 knee aspirates between February and March of this year. The first sampling she told me was when she was on antibiotics and did not grow anything; however the second grew group b strep within 24h. She showed me the lab result on her MyChart and it is pansensitive based on March 2019. On March 25 she underwent resection arthroplasty with antibiotic spacer. Seropurulent fluid was found in the joint. Synovectomy was performed. Minimal bone loss was achieved with removal of affixed components. Gentamicin impregnnated cement was placed into tibia.   She was seen by Dr. Johnnye Sima in the hospital at [facility] at the end of March and discharged on Ceftriaxone through May 6th. She has been home about a week now and this is the first time she has been out of the house.  Participating in rehab at home which is getting a little better however she knows she has another surgery to go so expects it will "not be the same as the first time." She has also noticed her right leg is a little longer with the spacer in there than her left--she has accommodated by wearing only a shoe on her left and walking only with supervision.   Only complaint today is that her PICC line has no "disc" on it and that she is getting symptoms concerning for a yeast infection just as she had in the past on antibiotics. Burning and itching in and around perineum/vagina. Her knee is a little swollen but no drainage and no "heat."   Review of Systems  Constitutional: Negative for chills and fever.  HENT: Negative for tinnitus.   Eyes: Negative for blurred vision and photophobia.  Respiratory: Negative for cough and sputum production.   Cardiovascular: Negative for chest pain.  Gastrointestinal: Negative for diarrhea, nausea and vomiting.  Genitourinary: Negative for dysuria.       Vaginal itching, burning.   Musculoskeletal: Positive for joint pain (right knee).  Skin: Negative for rash.  Neurological: Negative for headaches.    Past Medical History:  Diagnosis Date  . Allergic rhinitis   . Degenerative disc disease, cervical   . Diabetes mellitus without complication ([place])    type two  . Diarrhea 04/2012   colon nl, BX NPD; no response to cholestyramin 03-2012 no response to align; transient resp to sucralfate 12-13  . H/O CT scan of abdomen  08/2012   neg; egd bile reflux gastritisand linear eros; transient resp to sucralfate   . H/O ulcer disease   . Heart murmur    hx of " comes and goes"   . History of blood transfusion   . Hyperlipidemia   . Hyperplastic polyps of stomach   . Hypertension   . Hypothyroidism   . IBS (irritable bowel syndrome)   . Kissing, osteophytes    Dr Carloyn Manner  . Menopause   . Mild anemia 06/2011   14% iron sat,ferritin 14- from Metformin - anemia resolved    . Obesity   . Osteoarthritis   . PONV (postoperative nausea and vomiting)    years ago     Outpatient Medications Prior to Visit  Medication Sig Dispense Refill  . acetaminophen (TYLENOL) 650 MG CR tablet Take 1,300 mg by mouth 2 (two) times daily.     Marland Kitchen amLODipine-benazepril (LOTREL) 5-20 MG per capsule Take 1 capsule by mouth every morning.     . cefTRIAXone 2 g in sodium chloride 0.9 % 100 mL Inject 2 g into the vein daily. Given via PICC Line 40 packet 0  . methocarbamol (ROBAXIN) 500 MG tablet Take 1 tablet (500 mg total) by mouth every 6 (six) hours as needed for muscle spasms. 80 tablet 0  . ondansetron (ZOFRAN) 4 MG tablet Take 1 tablet (4 mg total) by mouth every 6 (six) hours as needed for nausea. 40 tablet 0  . oxyCODONE 10 MG TABS Take 1-1.5 tablets (10-15 mg total) by mouth every 4 (four) hours as needed for moderate pain or severe pain. 80 tablet 0  . pantoprazole (PROTONIX) 40 MG tablet Take 40 mg by mouth daily.     . rivaroxaban (XARELTO) 10 MG TABS tablet Take 1 tablet (10 mg total) by mouth daily with breakfast. Take Xarelto for two and a half more weeks following discharge from the hospital, then discontinue Xarelto. Once the patient has completed the Xarelto, they may resume the 81 mg Aspirin. 19 tablet 0  . sitaGLIPtin (JANUVIA) 100 MG tablet Take 100 mg by mouth daily.    Marland Kitchen SYNTHROID 112 MCG tablet Take 1125 mcg by mouth daily  3  . traMADol (ULTRAM) 50 MG tablet Take 1-2 tablets (50-100 mg total) by mouth every 6 (six) hours as needed (mild pain). 56 tablet 0  . fluconazole (DIFLUCAN) 150 MG tablet   5   No facility-administered medications prior to visit.      No Known Allergies  Social History   Tobacco Use  . Smoking status: Never Smoker  . Smokeless tobacco: Never Used  Substance Use Topics  . Alcohol use: No  . Drug use: No    Family History  Problem Relation Age of Onset  . Other Mother 75       sepsis of unknown etiology  . Lung cancer Father  71  . Diabetes Mellitus II Father   . Atrial fibrillation Father     Objective:   Vitals:   12/04/17 1043  BP: 115/76  Pulse: 89  Temp: 98.2 F (36.8 C)  TempSrc: Oral   There is no height or weight on file to calculate BMI.  Physical Exam  Constitutional: She is oriented to person, place, and time and well-developed, well-nourished, and in no distress.  Very pleasant woman seated comfortably in the chair with a walker for support. Her husband is present.   HENT:  Mouth/Throat: No oral lesions. No dental abscesses.  Cardiovascular: Normal rate,  regular rhythm and normal heart sounds.  Pulmonary/Chest: Effort normal and breath sounds normal.  Abdominal: Soft. She exhibits no distension. There is no tenderness.  Musculoskeletal: Normal range of motion. She exhibits no tenderness.       Legs: Lymphadenopathy:    She has no cervical adenopathy.  Neurological: She is alert and oriented to person, place, and time.  Skin: Skin is warm and dry. No rash noted.  Psychiatric: Mood, affect and judgment normal.    Lab Results: Lab Results  Component Value Date   WBC 11.8 (H) 11/25/2017   HGB 9.3 (L) 11/25/2017   HCT 29.5 (L) 11/25/2017   MCV 91.0 11/25/2017   PLT 562 (H) 11/25/2017    Lab Results  Component Value Date   CREATININE 0.73 11/25/2017   BUN 12 11/25/2017   NA 139 11/25/2017   K 4.5 11/25/2017   CL 103 11/25/2017   CO2 27 11/25/2017    Lab Results  Component Value Date   ALT 10 06/16/2017   AST 14 06/16/2017   ALKPHOS 115 01/24/2017   BILITOT 0.4 06/16/2017     Assessment & Plan:   Problem List Items Addressed This Visit      Musculoskeletal and Integument   Infection of total right knee replacement ([place]) - Primary    Now POD 11 s/p extraction of R total knee; day 11/42 Ceftriaxone for Group B Strep (S) infection. She seems to be healing well after surgery. Discussed course of care following extraction - she will stop Ceftriaxone as planned for now on  May 6th and PICC to be pulled. She would like to be seen one more time before this happens - will arrange for follow up with me one more time and again with Dr. Johnnye Sima or [place] after 4 weeks off antibiotics prior to consideration for re-implantation.         Genitourinary   Vaginal candidiasis    Will refill fluconazole. Discussed adding yogurt vs probiotics after treatment to see if it helps to prevent. Refills provided.         Other   Medication monitoring encounter    WBC slightly elevated 11.3k. Creatinine at baseline while on ceftriaxone. ESR 99, CRP 29.8 with her first weekly check. Will continue to monitor while on OPAT.    Sed Rate  Date Value  11/24/2017 77 mm/hr (H)  08/12/2017 17 mm/h  06/16/2017 11 mm/h   CRP  Date Value  11/24/2017 2.9 mg/dL (H)  08/12/2017 6.6 mg/L  06/16/2017 6.5 mg/L           Fraser Din will return to clinic in a few more weeks before her PICC is due to be pulled to ensure she is OK to have this removed.   Janene Madeira, MSN, NP-C [facility] for [facility] [place] Pager: [phone] Office: [phone]  12/07/17  1:33 PM

## 2017-12-04 NOTE — Telephone Encounter (Signed)
Thank you Ashley!

## 2017-12-04 NOTE — Telephone Encounter (Addendum)
[place] to to give verbal order for larger PICC dressings per Janene Madeira NP .  V.O given to [facility] RN. Mary verbalized understanding with read-back.   Pricilla Riffle RN

## 2017-12-07 ENCOUNTER — Encounter (INDEPENDENT_AMBULATORY_CARE_PROVIDER_SITE_OTHER): Payer: Self-pay

## 2017-12-07 DIAGNOSIS — E119 Type 2 diabetes mellitus without complications: Secondary | ICD-10-CM | POA: Diagnosis not present

## 2017-12-07 DIAGNOSIS — T8453XA Infection and inflammatory reaction due to internal right knee prosthesis, initial encounter: Secondary | ICD-10-CM | POA: Diagnosis not present

## 2017-12-07 DIAGNOSIS — B955 Unspecified streptococcus as the cause of diseases classified elsewhere: Secondary | ICD-10-CM | POA: Diagnosis not present

## 2017-12-07 DIAGNOSIS — M00261 Other streptococcal arthritis, right knee: Secondary | ICD-10-CM | POA: Diagnosis not present

## 2017-12-07 DIAGNOSIS — N390 Urinary tract infection, site not specified: Secondary | ICD-10-CM | POA: Diagnosis not present

## 2017-12-07 DIAGNOSIS — M5090 Cervical disc disorder, unspecified, unspecified cervical region: Secondary | ICD-10-CM | POA: Diagnosis not present

## 2017-12-07 NOTE — Assessment & Plan Note (Signed)
Will refill fluconazole. Discussed adding yogurt vs probiotics after treatment to see if it helps to prevent. Refills provided.

## 2017-12-07 NOTE — Assessment & Plan Note (Signed)
WBC slightly elevated 11.3k. Creatinine at baseline while on ceftriaxone. ESR 99, CRP 29.8 with her first weekly check. Will continue to monitor while on OPAT.    Sed Rate  Date Value  11/24/2017 77 mm/hr (H)  08/12/2017 17 mm/h  06/16/2017 11 mm/h   CRP  Date Value  11/24/2017 2.9 mg/dL (H)  08/12/2017 6.6 mg/L  06/16/2017 6.5 mg/L

## 2017-12-07 NOTE — Assessment & Plan Note (Signed)
Now POD 11 s/p extraction of R total knee; day 11/42 Ceftriaxone for Group B Strep (S) infection. She seems to be healing well after surgery. Discussed course of care following extraction - she will stop Ceftriaxone as planned for now on May 6th and PICC to be pulled. She would like to be seen one more time before this happens - will arrange for follow up with me one more time and again with Dr. Johnnye Sima or [place] after 4 weeks off antibiotics prior to consideration for re-implantation.

## 2017-12-08 DIAGNOSIS — N390 Urinary tract infection, site not specified: Secondary | ICD-10-CM | POA: Diagnosis not present

## 2017-12-08 DIAGNOSIS — Z5181 Encounter for therapeutic drug level monitoring: Secondary | ICD-10-CM | POA: Diagnosis not present

## 2017-12-08 DIAGNOSIS — M5090 Cervical disc disorder, unspecified, unspecified cervical region: Secondary | ICD-10-CM | POA: Diagnosis not present

## 2017-12-08 DIAGNOSIS — B955 Unspecified streptococcus as the cause of diseases classified elsewhere: Secondary | ICD-10-CM | POA: Diagnosis not present

## 2017-12-08 DIAGNOSIS — T8453XA Infection and inflammatory reaction due to internal right knee prosthesis, initial encounter: Secondary | ICD-10-CM | POA: Diagnosis not present

## 2017-12-08 DIAGNOSIS — E119 Type 2 diabetes mellitus without complications: Secondary | ICD-10-CM | POA: Diagnosis not present

## 2017-12-08 DIAGNOSIS — M00261 Other streptococcal arthritis, right knee: Secondary | ICD-10-CM | POA: Diagnosis not present

## 2017-12-09 DIAGNOSIS — M5090 Cervical disc disorder, unspecified, unspecified cervical region: Secondary | ICD-10-CM | POA: Diagnosis not present

## 2017-12-09 DIAGNOSIS — T8453XA Infection and inflammatory reaction due to internal right knee prosthesis, initial encounter: Secondary | ICD-10-CM | POA: Diagnosis not present

## 2017-12-09 DIAGNOSIS — M00261 Other streptococcal arthritis, right knee: Secondary | ICD-10-CM | POA: Diagnosis not present

## 2017-12-09 DIAGNOSIS — B955 Unspecified streptococcus as the cause of diseases classified elsewhere: Secondary | ICD-10-CM | POA: Diagnosis not present

## 2017-12-09 DIAGNOSIS — E119 Type 2 diabetes mellitus without complications: Secondary | ICD-10-CM | POA: Diagnosis not present

## 2017-12-09 DIAGNOSIS — N390 Urinary tract infection, site not specified: Secondary | ICD-10-CM | POA: Diagnosis not present

## 2017-12-11 ENCOUNTER — Other Ambulatory Visit: Payer: Self-pay | Admitting: Pharmacist

## 2017-12-14 DIAGNOSIS — M5090 Cervical disc disorder, unspecified, unspecified cervical region: Secondary | ICD-10-CM | POA: Diagnosis not present

## 2017-12-14 DIAGNOSIS — N390 Urinary tract infection, site not specified: Secondary | ICD-10-CM | POA: Diagnosis not present

## 2017-12-14 DIAGNOSIS — T8453XA Infection and inflammatory reaction due to internal right knee prosthesis, initial encounter: Secondary | ICD-10-CM | POA: Diagnosis not present

## 2017-12-14 DIAGNOSIS — E119 Type 2 diabetes mellitus without complications: Secondary | ICD-10-CM | POA: Diagnosis not present

## 2017-12-14 DIAGNOSIS — B955 Unspecified streptococcus as the cause of diseases classified elsewhere: Secondary | ICD-10-CM | POA: Diagnosis not present

## 2017-12-14 DIAGNOSIS — M00261 Other streptococcal arthritis, right knee: Secondary | ICD-10-CM | POA: Diagnosis not present

## 2017-12-15 DIAGNOSIS — T8453XA Infection and inflammatory reaction due to internal right knee prosthesis, initial encounter: Secondary | ICD-10-CM | POA: Diagnosis not present

## 2017-12-15 DIAGNOSIS — M00261 Other streptococcal arthritis, right knee: Secondary | ICD-10-CM | POA: Diagnosis not present

## 2017-12-15 DIAGNOSIS — E119 Type 2 diabetes mellitus without complications: Secondary | ICD-10-CM | POA: Diagnosis not present

## 2017-12-15 DIAGNOSIS — B955 Unspecified streptococcus as the cause of diseases classified elsewhere: Secondary | ICD-10-CM | POA: Diagnosis not present

## 2017-12-15 DIAGNOSIS — Z5181 Encounter for therapeutic drug level monitoring: Secondary | ICD-10-CM | POA: Diagnosis not present

## 2017-12-15 DIAGNOSIS — N390 Urinary tract infection, site not specified: Secondary | ICD-10-CM | POA: Diagnosis not present

## 2017-12-15 DIAGNOSIS — M5090 Cervical disc disorder, unspecified, unspecified cervical region: Secondary | ICD-10-CM | POA: Diagnosis not present

## 2017-12-16 DIAGNOSIS — N390 Urinary tract infection, site not specified: Secondary | ICD-10-CM | POA: Diagnosis not present

## 2017-12-16 DIAGNOSIS — E119 Type 2 diabetes mellitus without complications: Secondary | ICD-10-CM | POA: Diagnosis not present

## 2017-12-16 DIAGNOSIS — B955 Unspecified streptococcus as the cause of diseases classified elsewhere: Secondary | ICD-10-CM | POA: Diagnosis not present

## 2017-12-16 DIAGNOSIS — M00261 Other streptococcal arthritis, right knee: Secondary | ICD-10-CM | POA: Diagnosis not present

## 2017-12-16 DIAGNOSIS — M5090 Cervical disc disorder, unspecified, unspecified cervical region: Secondary | ICD-10-CM | POA: Diagnosis not present

## 2017-12-16 DIAGNOSIS — T8453XA Infection and inflammatory reaction due to internal right knee prosthesis, initial encounter: Secondary | ICD-10-CM | POA: Diagnosis not present

## 2017-12-17 ENCOUNTER — Other Ambulatory Visit: Payer: Self-pay | Admitting: Pharmacist

## 2017-12-21 DIAGNOSIS — B955 Unspecified streptococcus as the cause of diseases classified elsewhere: Secondary | ICD-10-CM | POA: Diagnosis not present

## 2017-12-21 DIAGNOSIS — E119 Type 2 diabetes mellitus without complications: Secondary | ICD-10-CM | POA: Diagnosis not present

## 2017-12-21 DIAGNOSIS — M00261 Other streptococcal arthritis, right knee: Secondary | ICD-10-CM | POA: Diagnosis not present

## 2017-12-21 DIAGNOSIS — N390 Urinary tract infection, site not specified: Secondary | ICD-10-CM | POA: Diagnosis not present

## 2017-12-21 DIAGNOSIS — T8453XA Infection and inflammatory reaction due to internal right knee prosthesis, initial encounter: Secondary | ICD-10-CM | POA: Diagnosis not present

## 2017-12-21 DIAGNOSIS — M5090 Cervical disc disorder, unspecified, unspecified cervical region: Secondary | ICD-10-CM | POA: Diagnosis not present

## 2017-12-22 DIAGNOSIS — B955 Unspecified streptococcus as the cause of diseases classified elsewhere: Secondary | ICD-10-CM | POA: Diagnosis not present

## 2017-12-22 DIAGNOSIS — N390 Urinary tract infection, site not specified: Secondary | ICD-10-CM | POA: Diagnosis not present

## 2017-12-22 DIAGNOSIS — M00261 Other streptococcal arthritis, right knee: Secondary | ICD-10-CM | POA: Diagnosis not present

## 2017-12-22 DIAGNOSIS — Z5181 Encounter for therapeutic drug level monitoring: Secondary | ICD-10-CM | POA: Diagnosis not present

## 2017-12-22 DIAGNOSIS — E119 Type 2 diabetes mellitus without complications: Secondary | ICD-10-CM | POA: Diagnosis not present

## 2017-12-22 DIAGNOSIS — T8453XA Infection and inflammatory reaction due to internal right knee prosthesis, initial encounter: Secondary | ICD-10-CM | POA: Diagnosis not present

## 2017-12-22 DIAGNOSIS — M5090 Cervical disc disorder, unspecified, unspecified cervical region: Secondary | ICD-10-CM | POA: Diagnosis not present

## 2017-12-23 DIAGNOSIS — M5090 Cervical disc disorder, unspecified, unspecified cervical region: Secondary | ICD-10-CM | POA: Diagnosis not present

## 2017-12-23 DIAGNOSIS — M00261 Other streptococcal arthritis, right knee: Secondary | ICD-10-CM | POA: Diagnosis not present

## 2017-12-23 DIAGNOSIS — E119 Type 2 diabetes mellitus without complications: Secondary | ICD-10-CM | POA: Diagnosis not present

## 2017-12-23 DIAGNOSIS — T8453XA Infection and inflammatory reaction due to internal right knee prosthesis, initial encounter: Secondary | ICD-10-CM | POA: Diagnosis not present

## 2017-12-23 DIAGNOSIS — N390 Urinary tract infection, site not specified: Secondary | ICD-10-CM | POA: Diagnosis not present

## 2017-12-23 DIAGNOSIS — B955 Unspecified streptococcus as the cause of diseases classified elsewhere: Secondary | ICD-10-CM | POA: Diagnosis not present

## 2017-12-25 ENCOUNTER — Other Ambulatory Visit: Payer: Self-pay | Admitting: Pharmacist

## 2017-12-28 ENCOUNTER — Ambulatory Visit (INDEPENDENT_AMBULATORY_CARE_PROVIDER_SITE_OTHER): Payer: Medicare Other | Admitting: Infectious Diseases

## 2017-12-28 VITALS — BP 132/78 | HR 73 | Temp 98.2°F | Wt 193.0 lb

## 2017-12-28 DIAGNOSIS — T8453XD Infection and inflammatory reaction due to internal right knee prosthesis, subsequent encounter: Secondary | ICD-10-CM

## 2017-12-28 DIAGNOSIS — Z95828 Presence of other vascular implants and grafts: Secondary | ICD-10-CM | POA: Diagnosis present

## 2017-12-28 DIAGNOSIS — D473 Essential (hemorrhagic) thrombocythemia: Secondary | ICD-10-CM | POA: Diagnosis not present

## 2017-12-28 DIAGNOSIS — D75839 Thrombocytosis, unspecified: Secondary | ICD-10-CM | POA: Insufficient documentation

## 2017-12-28 NOTE — Patient Instructions (Addendum)
Will look out for your labs that are drawn tomorrow and decide on your plan.   Keep taking your 81 mg Aspirin.   Will be in touch with you prior to your end date on your IV medications.   Follow up with Dr. Johnnye Sima as currently scheduled.   Wonderful to see you!

## 2017-12-28 NOTE — Progress Notes (Signed)
Patient: Sheri [name]  DOB: May 05, 1951 MRN: [id number] PCP: [name], MD  Referring Provider: Dr. Alu  Chief Complaint  Patient presents with  . Follow-up    Group B Strep Right Knee Infection      Patient Active Problem List   Diagnosis Date Noted  . Thrombocytosis ([place]) 12/28/2017  . Medication monitoring encounter 06/17/2017  . Vaginal candidiasis 03/02/2017  . Status post peripherally inserted central catheter (PICC) central line placement 03/02/2017  . Group B streptococcal infection   . Infection of total right knee replacement ([place]) 01/22/2017  . OA (osteoarthritis) of knee 02/12/2015  . Essential hypertension, benign 11/10/2013  . Pure hypercholesterolemia 11/10/2013  . Anemia 08/13/2011     Brief Narrative:  Sheri [name] is a 67 y.o. Caucasian female s/p right total knee placement extracted by Dr. Almyra Deforest for relapsing Group B Strep infection of this prosthesis. March 25 she underwent resection arthroplasty with antibiotic spacer. Seropurulent fluid was found in the joint. Synovectomy was performed. Minimal bone loss was achieved with removal of affixed components. Gentamicin impregnnated cement was placed into tibia.  PICC line was placed and discharged home on IV Ceftriaxone 2 gm daily for planned 6 week course.   Brief Narrative:  Sheri [name] is here today alone for follow up. She has been doing well and walking more and more at home even visiting her grand children recently. She has been off all narcotics for about a week and a half now and pain is managed well with Tylenol alone. During an outing yesterday she describes her knee to have "blown up" after walking outdoors with her grandchildren with aching/throbbing pain. She has been trying to rest/elevate and ice her knee to help but it is still swollen and warm today. She has had a difficult time balancing what she would like to be able to do regarding rehabilitation and what she is physically limited to  at times. No fevers or chills and the pain "is not the same as when it was infected" from her description. She is doing well maintaining her PICC line and compliant with antibiotic administration.   Review of Systems  Constitutional: Negative for chills and fever.  HENT: Negative for tinnitus.   Eyes: Negative for blurred vision and photophobia.  Respiratory: Negative for cough and sputum production.   Cardiovascular: Negative for chest pain.  Gastrointestinal: Negative for diarrhea, nausea and vomiting.  Genitourinary: Negative for dysuria.  Musculoskeletal: Positive for joint pain (right knee).  Skin: Negative for rash.  Neurological: Negative for headaches.    Past Medical History:  Diagnosis Date  . Allergic rhinitis   . Degenerative disc disease, cervical   . Diabetes mellitus without complication ([place])    type two  . Diarrhea 04/2012   colon nl, BX NPD; no response to cholestyramin 03-2012 no response to align; transient resp to sucralfate 12-13  . H/O CT scan of abdomen 08/2012   neg; egd bile reflux gastritisand linear eros; transient resp to sucralfate   . H/O ulcer disease   . Heart murmur    hx of " comes and goes"   . History of blood transfusion   . Hyperlipidemia   . Hyperplastic polyps of stomach   . Hypertension   . Hypothyroidism   . IBS (irritable bowel syndrome)   . Kissing, osteophytes    Dr Carloyn Manner  . Menopause   . Mild anemia 06/2011   14% iron sat,ferritin 14- from Metformin - anemia resolved   .  Obesity   . Osteoarthritis   . PONV (postoperative nausea and vomiting)    years ago     Outpatient Medications Prior to Visit  Medication Sig Dispense Refill  . acetaminophen (TYLENOL) 650 MG CR tablet Take 1,300 mg by mouth 2 (two) times daily.     Marland Kitchen amLODipine-benazepril (LOTREL) 5-20 MG per capsule Take 1 capsule by mouth every morning.     . cefTRIAXone 2 g in sodium chloride 0.9 % 100 mL Inject 2 g into the vein daily. Given via PICC Line 40 packet 0    . methocarbamol (ROBAXIN) 500 MG tablet Take 1 tablet (500 mg total) by mouth every 6 (six) hours as needed for muscle spasms. 80 tablet 0  . ondansetron (ZOFRAN) 4 MG tablet Take 1 tablet (4 mg total) by mouth every 6 (six) hours as needed for nausea. 40 tablet 0  . oxyCODONE 10 MG TABS Take 1-1.5 tablets (10-15 mg total) by mouth every 4 (four) hours as needed for moderate pain or severe pain. 80 tablet 0  . pantoprazole (PROTONIX) 40 MG tablet Take 40 mg by mouth daily.     . rivaroxaban (XARELTO) 10 MG TABS tablet Take 1 tablet (10 mg total) by mouth daily with breakfast. Take Xarelto for two and a half more weeks following discharge from the hospital, then discontinue Xarelto. Once the patient has completed the Xarelto, they may resume the 81 mg Aspirin. 19 tablet 0  . sitaGLIPtin (JANUVIA) 100 MG tablet Take 100 mg by mouth daily.    Marland Kitchen SYNTHROID 112 MCG tablet Take 1125 mcg by mouth daily  3  . traMADol (ULTRAM) 50 MG tablet Take 1-2 tablets (50-100 mg total) by mouth every 6 (six) hours as needed (mild pain). 56 tablet 0   No facility-administered medications prior to visit.      No Known Allergies  Social History   Tobacco Use  . Smoking status: Never Smoker  . Smokeless tobacco: Never Used  Substance Use Topics  . Alcohol use: No  . Drug use: No    Family History  Problem Relation Age of Onset  . Other Mother 29       sepsis of unknown etiology  . Lung cancer Father 53  . Diabetes Mellitus II Father   . Atrial fibrillation Father     Objective:   Vitals:   12/28/17 1433  BP: 132/78  Pulse: 73  Temp: 98.2 F (36.8 C)  Weight: 87.5 kg (193 lb)   Body mass index is 32.12 kg/m.  Physical Exam  Constitutional: She is oriented to person, place, and time and well-developed, well-nourished, and in no distress.  Seated comfortably in the chair with rolling walker.   HENT:  Mouth/Throat: No oral lesions. No dental abscesses.  Cardiovascular: Normal rate, regular  rhythm and normal heart sounds.  Pulmonary/Chest: Effort normal and breath sounds normal.  Abdominal: Soft. She exhibits no distension. There is no tenderness.  Musculoskeletal: Normal range of motion. She exhibits no tenderness.       Legs: Lymphadenopathy:    She has no cervical adenopathy.  Neurological: She is alert and oriented to person, place, and time.  Skin: Skin is warm and dry. No rash noted.  Psychiatric: Mood, affect and judgment normal.    Lab Results: Lab Results  Component Value Date   WBC 11.8 (H) 11/25/2017   HGB 9.3 (L) 11/25/2017   HCT 29.5 (L) 11/25/2017   MCV 91.0 11/25/2017   PLT 562 (H) 11/25/2017  Lab Results  Component Value Date   CREATININE 0.73 11/25/2017   BUN 12 11/25/2017   NA 139 11/25/2017   K 4.5 11/25/2017   CL 103 11/25/2017   CO2 27 11/25/2017    Lab Results  Component Value Date   ALT 10 06/16/2017   AST 14 06/16/2017   ALKPHOS 115 01/24/2017   BILITOT 0.4 06/16/2017     Assessment & Plan:   Problem List Items Addressed This Visit      Musculoskeletal and Integument   Infection of total right knee replacement ([facility])    POD 32 from extraction of right total knee. Day 32/42 for Ceftriaxone. Her knee is warm and a bit more swollen today than I would have expected; she attributes to having had "blown up" from recent over-exertion. Inflammatory markers have trended up since surgery. Will see what herlabs look like after they are drawn this week and call to discuss with her orthopedic surgeon.  She is not having any signs of fevers.   Recent trend  ESR 77 > 114 >> pending this week ESR 8 >> 22 >> pending this week   She is due to return to see Dr. Johnnye Sima in 4 weeks from stopping antibiotics for follow up prior to consideration of re-implantation.   ADDENDUM: See telephone note from 01/01/18 for updated OPAT end date for therapy.         Hematopoietic and Hemostatic   Thrombocytosis ([place])    Likely reactive in the setting of  recent surgery and infection. Continue 81 mg ASA as she was previously instructed and will continue to treat infection.        Other   Status post peripherally inserted central catheter (PICC) central line placement    Site clean and dry and looks good. No signs of infection or DVT associated with line. Continue through end of antibiotics then D/C.         Janene Madeira, MSN, NP-C [facility] for Infect[facility] [place] Pager: [phone] Office: [phone]  01/01/18  2:58 PM

## 2017-12-28 NOTE — Assessment & Plan Note (Addendum)
Likely reactive in the setting of recent surgery and infection. Continue 81 mg ASA as she was previously instructed and will continue to treat infection.

## 2017-12-28 NOTE — Assessment & Plan Note (Addendum)
POD 32 from extraction of right total knee. Day 32/42 for Ceftriaxone. Her knee is warm and a bit more swollen today than I would have expected; she attributes to having had "blown up" from recent over-exertion. Inflammatory markers have trended up since surgery. Will see what herlabs look like after they are drawn this week and call to discuss with her orthopedic surgeon.  She is not having any signs of fevers.   Recent trend  ESR 77 > 114 >> pending this week ESR 8 >> 22 >> pending this week   She is due to return to see Dr. Johnnye Sima in 4 weeks from stopping antibiotics for follow up prior to consideration of re-implantation.   ADDENDUM: See telephone note from 01/01/18 for updated OPAT end date for therapy.

## 2017-12-29 ENCOUNTER — Encounter: Payer: Self-pay | Admitting: Infectious Diseases

## 2017-12-29 DIAGNOSIS — N390 Urinary tract infection, site not specified: Secondary | ICD-10-CM | POA: Diagnosis not present

## 2017-12-29 DIAGNOSIS — Z5181 Encounter for therapeutic drug level monitoring: Secondary | ICD-10-CM | POA: Diagnosis not present

## 2017-12-29 DIAGNOSIS — T8453XA Infection and inflammatory reaction due to internal right knee prosthesis, initial encounter: Secondary | ICD-10-CM | POA: Diagnosis not present

## 2017-12-29 DIAGNOSIS — M00261 Other streptococcal arthritis, right knee: Secondary | ICD-10-CM | POA: Diagnosis not present

## 2017-12-29 DIAGNOSIS — E119 Type 2 diabetes mellitus without complications: Secondary | ICD-10-CM | POA: Diagnosis not present

## 2017-12-29 DIAGNOSIS — B955 Unspecified streptococcus as the cause of diseases classified elsewhere: Secondary | ICD-10-CM | POA: Diagnosis not present

## 2017-12-29 DIAGNOSIS — M5090 Cervical disc disorder, unspecified, unspecified cervical region: Secondary | ICD-10-CM | POA: Diagnosis not present

## 2017-12-30 ENCOUNTER — Institutional Professional Consult (permissible substitution): Payer: Medicare Other | Admitting: Internal Medicine

## 2017-12-30 ENCOUNTER — Telehealth: Payer: Self-pay | Admitting: Pharmacist

## 2017-12-30 NOTE — Telephone Encounter (Signed)
Per Colletta [place], called and gave verbal order to [facility] to draw a CRP and ESR for patient. Melissa verbalized understanding.

## 2017-12-30 NOTE — Telephone Encounter (Signed)
Thank you much.

## 2017-12-31 ENCOUNTER — Ambulatory Visit: Payer: Medicare Other | Admitting: Infectious Diseases

## 2018-01-01 ENCOUNTER — Encounter: Payer: Self-pay | Admitting: Infectious Diseases

## 2018-01-01 ENCOUNTER — Telehealth: Payer: Self-pay | Admitting: Infectious Diseases

## 2018-01-01 NOTE — Assessment & Plan Note (Signed)
Site clean and dry and looks good. No signs of infection or DVT associated with line. Continue through end of antibiotics then D/C.

## 2018-01-01 NOTE — Telephone Encounter (Signed)
Earlier today I called Dr. Aluisio to discuss swelling/warmth on exam earlier this week. Could have been overuse on Sheri [name]'s part but it was concerning to me 31 days post op. CRP improved (22>>8) and ESR pending still. WBC normal. Will extend out her antibiotics 2 more weeks through May 20th and continue to monitor. No plans for re-implantation until mid June.   I called Sheri [name] today to inform her of our discussion and my concern. She tells me that she feels like she "tweaked" her leg again since we saw each other when she was getting out of a chair. However goes on to say it is much better with rest/ice now. I informed her that we would like to schedule her antibiotics a little longer to be on the safe side; true test will be when we stop and monitor for several weeks before her knee is re-implanted. She understands and agrees with this plan. I answered all questions she had regarding her recent lab work. She is happy her platelets have dropped back a little (450).   She will follow up with Dr. Hatcher in 3-4 weeks as currently planned off antibiotics. Will pull PICC after completion on 01/18/2018.   Stephanie Dixon, MSN, NP-C [facility] Office: [phone] Pager: [phone]  01/01/2018  2:10 PM        

## 2018-01-01 NOTE — Telephone Encounter (Signed)
Thank you stephanie!

## 2018-01-05 ENCOUNTER — Telehealth: Payer: Self-pay | Admitting: Pharmacist

## 2018-01-05 DIAGNOSIS — T8453XA Infection and inflammatory reaction due to internal right knee prosthesis, initial encounter: Secondary | ICD-10-CM | POA: Diagnosis not present

## 2018-01-05 DIAGNOSIS — E119 Type 2 diabetes mellitus without complications: Secondary | ICD-10-CM | POA: Diagnosis not present

## 2018-01-05 DIAGNOSIS — M5090 Cervical disc disorder, unspecified, unspecified cervical region: Secondary | ICD-10-CM | POA: Diagnosis not present

## 2018-01-05 DIAGNOSIS — B955 Unspecified streptococcus as the cause of diseases classified elsewhere: Secondary | ICD-10-CM | POA: Diagnosis not present

## 2018-01-05 DIAGNOSIS — N390 Urinary tract infection, site not specified: Secondary | ICD-10-CM | POA: Diagnosis not present

## 2018-01-05 DIAGNOSIS — Z5181 Encounter for therapeutic drug level monitoring: Secondary | ICD-10-CM | POA: Diagnosis not present

## 2018-01-05 DIAGNOSIS — M00261 Other streptococcal arthritis, right knee: Secondary | ICD-10-CM | POA: Diagnosis not present

## 2018-01-05 NOTE — Telephone Encounter (Signed)
Thank you :)

## 2018-01-05 NOTE — Telephone Encounter (Signed)
Called and gave verbal order to Coretta at [facility] to extend patient's IV antibiotics until 5/20 per [facility]. Coretta verbalized understanding.

## 2018-01-08 ENCOUNTER — Other Ambulatory Visit: Payer: Self-pay | Admitting: Pharmacist

## 2018-01-08 NOTE — Progress Notes (Signed)
OPAT lab monitoring 

## 2018-01-12 DIAGNOSIS — Z6835 Body mass index (BMI) 35.0-35.9, adult: Secondary | ICD-10-CM | POA: Diagnosis not present

## 2018-01-12 DIAGNOSIS — E119 Type 2 diabetes mellitus without complications: Secondary | ICD-10-CM | POA: Diagnosis not present

## 2018-01-12 DIAGNOSIS — M5090 Cervical disc disorder, unspecified, unspecified cervical region: Secondary | ICD-10-CM | POA: Diagnosis not present

## 2018-01-12 DIAGNOSIS — T8453XA Infection and inflammatory reaction due to internal right knee prosthesis, initial encounter: Secondary | ICD-10-CM | POA: Diagnosis not present

## 2018-01-12 DIAGNOSIS — Z5181 Encounter for therapeutic drug level monitoring: Secondary | ICD-10-CM | POA: Diagnosis not present

## 2018-01-12 DIAGNOSIS — N390 Urinary tract infection, site not specified: Secondary | ICD-10-CM | POA: Diagnosis not present

## 2018-01-12 DIAGNOSIS — B955 Unspecified streptococcus as the cause of diseases classified elsewhere: Secondary | ICD-10-CM | POA: Diagnosis not present

## 2018-01-12 DIAGNOSIS — M47816 Spondylosis without myelopathy or radiculopathy, lumbar region: Secondary | ICD-10-CM | POA: Diagnosis not present

## 2018-01-12 DIAGNOSIS — M00261 Other streptococcal arthritis, right knee: Secondary | ICD-10-CM | POA: Diagnosis not present

## 2018-01-15 ENCOUNTER — Other Ambulatory Visit: Payer: Self-pay | Admitting: Pharmacist

## 2018-01-15 NOTE — Progress Notes (Signed)
OPAT pharmacy lab review  

## 2018-01-18 ENCOUNTER — Encounter: Payer: Self-pay | Admitting: Infectious Diseases

## 2018-01-18 ENCOUNTER — Ambulatory Visit (INDEPENDENT_AMBULATORY_CARE_PROVIDER_SITE_OTHER): Payer: Medicare Other | Admitting: Infectious Diseases

## 2018-01-18 DIAGNOSIS — T8453XD Infection and inflammatory reaction due to internal right knee prosthesis, subsequent encounter: Secondary | ICD-10-CM | POA: Diagnosis present

## 2018-01-18 NOTE — Assessment & Plan Note (Signed)
She will get [facility] pulled tomorrow Will get repeat ESR and CRP tomorrow.  Will see her back later in the summer after she returns from travels.  Consider ESR and CRP then.

## 2018-01-18 NOTE — Progress Notes (Signed)
   Subjective:    [name], female    DOB: 19-Jun-1951, 67 y.o.   MRN: [id number]  HPI 67 yo F with DM2, who had R knee synovectomy anf removal of parts on 11-23-17 after her outpt Cx grew Group B  She was d/c home on 11-25-17 on ceftriaxone to complete on 01-17-18.  Her ESR was 77 and CRP 2.9 in hospital.    ESR  CRP 12-01-17  99  29.8 12-13-17 114  22 01-12-18  53  6.7   "I am ready to get this over with". Will get [facility] out today or tomorrow.  No problems with PIC line.  Has seen Dr Maureen Ralphs- will wait 4-8 weeks to get new knee to make sure she has no recurrence. She will time this around when she goes to beach (with 5 grandchildren).  Her stamina has improved.   Does not check FSG, has been eating less sweats.     Review of Systems  Constitutional: Negative for appetite change, chills, fever and unexpected weight change.  Gastrointestinal: Negative for constipation and diarrhea.  Genitourinary: Negative for dysuria.       Objective:   Physical Exam  Constitutional: She appears well-developed and well-nourished.  HENT:  Mouth/Throat: No oropharyngeal exudate.  Eyes: Pupils are equal, round, and reactive to light. EOM are normal.  Neck: Normal range of motion. Neck supple.  Cardiovascular: Normal rate, regular rhythm and normal heart sounds.  Pulmonary/Chest: Effort normal and breath sounds normal.  Abdominal: Soft. Bowel sounds are normal. She exhibits no distension.  Musculoskeletal:       Arms:      Legs: Lymphadenopathy:    She has no cervical adenopathy.       Assessment & Plan:

## 2018-01-19 ENCOUNTER — Encounter: Payer: Self-pay | Admitting: Infectious Diseases

## 2018-01-19 DIAGNOSIS — B955 Unspecified streptococcus as the cause of diseases classified elsewhere: Secondary | ICD-10-CM | POA: Diagnosis not present

## 2018-01-19 DIAGNOSIS — M00261 Other streptococcal arthritis, right knee: Secondary | ICD-10-CM | POA: Diagnosis not present

## 2018-01-19 DIAGNOSIS — Z5181 Encounter for therapeutic drug level monitoring: Secondary | ICD-10-CM | POA: Diagnosis not present

## 2018-01-19 DIAGNOSIS — N390 Urinary tract infection, site not specified: Secondary | ICD-10-CM | POA: Diagnosis not present

## 2018-01-19 DIAGNOSIS — M5090 Cervical disc disorder, unspecified, unspecified cervical region: Secondary | ICD-10-CM | POA: Diagnosis not present

## 2018-01-19 DIAGNOSIS — T8453XA Infection and inflammatory reaction due to internal right knee prosthesis, initial encounter: Secondary | ICD-10-CM | POA: Diagnosis not present

## 2018-01-19 DIAGNOSIS — E119 Type 2 diabetes mellitus without complications: Secondary | ICD-10-CM | POA: Diagnosis not present

## 2018-01-21 ENCOUNTER — Institutional Professional Consult (permissible substitution): Payer: Medicare Other | Admitting: Internal Medicine

## 2018-01-21 ENCOUNTER — Encounter: Payer: Self-pay | Admitting: Internal Medicine

## 2018-01-21 ENCOUNTER — Ambulatory Visit (INDEPENDENT_AMBULATORY_CARE_PROVIDER_SITE_OTHER)
Admission: RE | Admit: 2018-01-21 | Discharge: 2018-01-21 | Disposition: A | Discharge status: Home / Self Care | Payer: Medicare Other | Source: Ambulatory Visit | Attending: Internal Medicine | Admitting: Internal Medicine

## 2018-01-21 ENCOUNTER — Ambulatory Visit (INDEPENDENT_AMBULATORY_CARE_PROVIDER_SITE_OTHER): Payer: Medicare Other | Admitting: Internal Medicine

## 2018-01-21 VITALS — BP 130/82 | HR 72 | Ht 65.0 in | Wt 215.6 lb

## 2018-01-21 DIAGNOSIS — E669 Obesity, unspecified: Secondary | ICD-10-CM | POA: Diagnosis not present

## 2018-01-21 DIAGNOSIS — R0600 Dyspnea, unspecified: Secondary | ICD-10-CM

## 2018-01-21 DIAGNOSIS — R0609 Other forms of dyspnea: Secondary | ICD-10-CM

## 2018-01-21 MED ORDER — ALBUTEROL SULFATE HFA 108 (90 BASE) MCG/ACT IN AERS: 2.0000 | INHALATION_SPRAY | Freq: Four times a day (QID) | RESPIRATORY_TRACT | 6 refills | Status: DC | PRN

## 2018-01-21 NOTE — Progress Notes (Signed)
Subjective:    [name], female    DOB: [birth year], 67 y.o.   MRN: [id number]  PCP [name], MD   HPI  IOV 01/21/2018  Chief Complaint  Patient presents with  . Consult    SOB w/exertion and while resting, patient reports the tightness she feels during SOB radiates across her neck and chest. Last chest x ray August 2018. PICC line removed Monday for knee infection (ceftriaxone).     Sheri [name] is a 67 year old lady presenting for consultation for chronic shortness of breath.  She tells me that she suffers from obesity with a BMI close to 35 and associated hypertension and diabetes.  She is a non-smoker.  She has insidious onset of shortness of breath for the last 3 years.  She describes the shortness of breath as chest tightness in her upper chest radiating into the neck and back of the neck.  Because of this she did have a cardiac stress test in December 2017 that I personally reviewed the result it is normal with an ejection fraction of 66% [in 2012 review of the chart shows she had an echocardiogram that is reported as normal but no comment on diastolic dysfunction].  After that she did not really notice shortness of breath because her health issues have been complicated by occurrence of group B streptococcus right knee infection and sepsis.  She has battled this for most of 2018 requiring chronic antibiotic therapy.  Subsequently in early 2019 there was reinfection of this and she has had all the prosthesis removed and now has a spacer followed by prolonged antibiotic therapy ending 3 days ago.  She uses a walker now to get around.  She is due for a total knee replacement at some point in time in the future.  Her orthopedic surgeon is Dr. Lucienne Capers.  She says that she has been instructed to re-exert herself and recently while walking at [facility] after doing groceries her symptoms record.  The same thing happened a few years ago before the knee problems  while she walked to football for lengths to see her daughter who had a hysterectomy.  There is no diaphoresis.  Is no chest pain orthopnea proximal nocturnal dyspnea wheezing or weight loss.  She has never tried albuterol for the same   Personal visualization of a CT scan abdomen lung cuts: Shows healthy lung base.  Personal visualization of the chest x-ray agree with the formal report in August 2018 shows clear lung  Stress test dec 2017 - normal with ef 66%    Simple office walk 185 feet x  3 laps goal with forehead probe 01/21/2018   O2 used none  Number laps completed 3  Comments about pace Slow pace with walker due to knee spacer  Resting Pulse Ox/HR 100% and 70/min  Final Pulse Ox/HR 100% and 117/min  Desaturated </= 88% no  Desaturated <= 3% points no  Got Tachycardic >/= 90/min yes  Symptoms at end of test x  Miscellaneous comments Used walker       has a past medical history of Allergic rhinitis, Degenerative disc disease, cervical, Diabetes mellitus without complication ([facility]), Diarrhea (04/2012), H/O CT scan of abdomen (08/2012), H/O ulcer disease, Heart murmur, History of blood transfusion, Hyperlipidemia, Hyperplastic polyps of stomach, Hypertension, Hypothyroidism, IBS (irritable bowel syndrome), Kissing, osteophytes, Menopause, Mild anemia (06/2011), Obesity, Osteoarthritis, and PONV (postoperative nausea and vomiting).   reports that she has never smoked. She has never used smokeless  tobacco.  Past Surgical History:  Procedure Laterality Date  . CESAREAN SECTION    . CHOLECYSTECTOMY    . COLONOSCOPY  2/06,04/20/12 repeat 10 years   dr Cristina Gong  . EXCISIONAL TOTAL KNEE ARTHROPLASTY WITH ANTIBIOTIC SPACERS Right 11/23/2017   Procedure: Right knee resection arthroplasty; antibiotic spacer;  Surgeon: Gaynelle Arabian, MD;  Location: [facility];  Service: Orthopedics;  Laterality: Right;  . I&D KNEE WITH POLY EXCHANGE Right 01/22/2017   Procedure: IRRIGATION AND DEBRIDEMENT KNEE  WITH POLY EXCHANGE;  Surgeon: Paralee Cancel, MD;  Location: [facility];  Service: Orthopedics;  Laterality: Right;  . JOINT REPLACEMENT     right knee   . TOTAL KNEE ARTHROPLASTY Left 02/12/2015   Procedure: LEFT TOTAL KNEE ARTHROPLASTY;  Surgeon: Gaynelle Arabian, MD;  Location: [facility];  Service: Orthopedics;  Laterality: Left;    No Known Allergies  Immunization History  Administered Date(s) Administered  . Influenza-Unspecified 06/16/2017  . Zoster Recombinat (Shingrix) 10/05/2017    Family History  Problem Relation Age of Onset  . Other Mother 36       sepsis of unknown etiology  . Lung cancer Father 73  . Diabetes Mellitus II Father   . Atrial fibrillation Father      Current Outpatient Medications:  .  acetaminophen (TYLENOL) 650 MG CR tablet, Take 1,300 mg by mouth 2 (two) times daily. , Disp: , Rfl:  .  amLODipine-benazepril (LOTREL) 5-20 MG per capsule, Take 1 capsule by mouth every morning. , Disp: , Rfl:  .  methocarbamol (ROBAXIN) 500 MG tablet, Take 1 tablet (500 mg total) by mouth every 6 (six) hours as needed for muscle spasms., Disp: 80 tablet, Rfl: 0 .  oxyCODONE 10 MG TABS, Take 1-1.5 tablets (10-15 mg total) by mouth every 4 (four) hours as needed for moderate pain or severe pain., Disp: 80 tablet, Rfl: 0 .  pantoprazole (PROTONIX) 40 MG tablet, Take 40 mg by mouth daily. , Disp: , Rfl:  .  rivaroxaban (XARELTO) 10 MG TABS tablet, Take 1 tablet (10 mg total) by mouth daily with breakfast. Take Xarelto for two and a half more weeks following discharge from the hospital, then discontinue Xarelto. Once the patient has completed the Xarelto, they may resume the 81 mg Aspirin., Disp: 19 tablet, Rfl: 0 .  sitaGLIPtin (JANUVIA) 100 MG tablet, Take 100 mg by mouth daily., Disp: , Rfl:  .  SYNTHROID 112 MCG tablet, Take 1125 mcg by mouth daily, Disp: , Rfl: 3 .  traMADol (ULTRAM) 50 MG tablet, Take 1-2 tablets (50-100 mg total) by mouth every 6 (six) hours as needed (mild pain).,  Disp: 56 tablet, Rfl: 0 .  cefTRIAXone 2 g in sodium chloride 0.9 % 100 mL, Inject 2 g into the vein daily. Given via PICC Line (Patient not taking: Reported on 01/21/2018), Disp: 40 packet, Rfl: 0 .  ondansetron (ZOFRAN) 4 MG tablet, Take 1 tablet (4 mg total) by mouth every 6 (six) hours as needed for nausea. (Patient not taking: Reported on 01/21/2018), Disp: 40 tablet, Rfl: 0   Review of Systems  Constitutional: Positive for fever. Negative for unexpected weight change.  HENT: Positive for trouble swallowing. Negative for congestion, dental problem, ear pain, nosebleeds, postnasal drip, rhinorrhea, sinus pressure, sneezing and sore throat.   Eyes: Negative for redness and itching.  Respiratory: Positive for cough. Negative for chest tightness, shortness of breath and wheezing.   Cardiovascular: Negative for palpitations and leg swelling.  Gastrointestinal: Positive for nausea. Negative for vomiting.  Genitourinary: Negative for dysuria.  Musculoskeletal: Negative for joint swelling.  Skin: Negative for rash.  Allergic/Immunologic: Negative.  Negative for environmental allergies, food allergies and immunocompromised state.  Neurological: Negative for headaches.  Hematological: Does not bruise/bleed easily.  Psychiatric/Behavioral: Negative for dysphoric mood. The patient is not nervous/anxious.        Objective:   Physical Exam  Vitals:   01/21/18 1036  BP: 130/82  Pulse: 72  SpO2: 99%  Weight: 215 lb 9.6 oz (97.8 kg)  Height: 5\' 5"  (1.651 m)    Estimated body mass index is 35.88 kg/m as calculated from the following:   Height as of this encounter: 5\' 5"  (1.651 m).   Weight as of this encounter: 215 lb 9.6 oz (97.8 kg).       Assessment & Plan:     ICD-10-CM   1. Dyspnea on exertion R06.09   2. Obesity (BMI 30-39.9) E66.9       Possibilities are obesity diastolic dysfunction with deconditioning. Or, exercise induced bronchospasm. Probability for Pulmonary fibrosis  or COPD is very low  PLAN  Do echo (last in 2012) -anytime next few weeks to evaluate for diastolic dysfunction Do CXR 2 view 01/21/2018 Use albuterol 2 puff - some 5-10 min before exertion and see how this goes  Followup 4 weeks and depending on feedback will consider CT chest/ PFT testing/feno     Dr. Brand Males, M.D., [facility].C.P Pulmonary and Critical Care Medicine Staff Physician, [place] Director - Interstitial Lung Disease  Program  Pulmonary [place] at [place], [place], [address]  Pager: [phone], If no answer or between  15:00h - 7:00h: call [phone] Telephone: [phone]

## 2018-01-21 NOTE — Patient Instructions (Addendum)
ICD-10-CM   1. Dyspnea on exertion R06.09   2. Obesity (BMI 30-39.9) E66.9    Possibilities are obesity diastolic dysfunction with deconditioning. Or, exercise induced bronchospasm. Probability for Pulmonary fibrosis or COPD is very low  PLAN  Do echo (last in 2012) -anytime next few weeks to evaluate for diastolic dysfunction Do CXR 2 view 01/21/2018 Use albuterol 2 puff - some 5-10 min before exertion and see how this goes  Followup 4 weeks and depending on feedback will consider CT chest/ PFT testing/feno

## 2018-01-22 ENCOUNTER — Other Ambulatory Visit: Payer: Self-pay | Admitting: Pharmacist

## 2018-01-22 NOTE — Progress Notes (Signed)
OPAT pharmacy lab review  

## 2018-01-28 ENCOUNTER — Other Ambulatory Visit: Payer: Self-pay

## 2018-01-28 ENCOUNTER — Ambulatory Visit (HOSPITAL_COMMUNITY): Payer: Medicare Other | Attending: Cardiovascular Disease

## 2018-01-28 DIAGNOSIS — E669 Obesity, unspecified: Secondary | ICD-10-CM | POA: Diagnosis not present

## 2018-01-28 DIAGNOSIS — Z6835 Body mass index (BMI) 35.0-35.9, adult: Secondary | ICD-10-CM | POA: Diagnosis not present

## 2018-01-28 DIAGNOSIS — R0600 Dyspnea, unspecified: Secondary | ICD-10-CM

## 2018-01-28 DIAGNOSIS — R0609 Other forms of dyspnea: Secondary | ICD-10-CM | POA: Diagnosis not present

## 2018-02-03 DIAGNOSIS — T8453XD Infection and inflammatory reaction due to internal right knee prosthesis, subsequent encounter: Secondary | ICD-10-CM | POA: Diagnosis not present

## 2018-03-16 ENCOUNTER — Other Ambulatory Visit: Payer: Self-pay | Admitting: Internal Medicine

## 2018-03-16 DIAGNOSIS — T8453XA Infection and inflammatory reaction due to internal right knee prosthesis, initial encounter: Secondary | ICD-10-CM | POA: Diagnosis not present

## 2018-03-16 DIAGNOSIS — E1169 Type 2 diabetes mellitus with other specified complication: Secondary | ICD-10-CM | POA: Diagnosis not present

## 2018-03-16 DIAGNOSIS — E785 Hyperlipidemia, unspecified: Secondary | ICD-10-CM | POA: Diagnosis not present

## 2018-03-16 DIAGNOSIS — E039 Hypothyroidism, unspecified: Secondary | ICD-10-CM | POA: Diagnosis not present

## 2018-03-16 DIAGNOSIS — K219 Gastro-esophageal reflux disease without esophagitis: Secondary | ICD-10-CM | POA: Diagnosis not present

## 2018-03-16 DIAGNOSIS — I10 Essential (primary) hypertension: Secondary | ICD-10-CM | POA: Diagnosis not present

## 2018-03-16 DIAGNOSIS — R350 Frequency of micturition: Secondary | ICD-10-CM | POA: Diagnosis not present

## 2018-03-16 DIAGNOSIS — R0600 Dyspnea, unspecified: Secondary | ICD-10-CM

## 2018-03-16 DIAGNOSIS — N3000 Acute cystitis without hematuria: Secondary | ICD-10-CM | POA: Diagnosis not present

## 2018-03-16 DIAGNOSIS — R0609 Other forms of dyspnea: Secondary | ICD-10-CM

## 2018-03-17 ENCOUNTER — Encounter: Payer: Self-pay | Admitting: Internal Medicine

## 2018-03-17 ENCOUNTER — Ambulatory Visit (INDEPENDENT_AMBULATORY_CARE_PROVIDER_SITE_OTHER): Payer: Medicare Other | Admitting: Internal Medicine

## 2018-03-17 VITALS — BP 126/78 | HR 67 | Ht 64.0 in | Wt 221.0 lb

## 2018-03-17 DIAGNOSIS — R0609 Other forms of dyspnea: Secondary | ICD-10-CM

## 2018-03-17 DIAGNOSIS — R942 Abnormal results of pulmonary function studies: Secondary | ICD-10-CM | POA: Diagnosis not present

## 2018-03-17 DIAGNOSIS — R600 Localized edema: Secondary | ICD-10-CM

## 2018-03-17 DIAGNOSIS — R0602 Shortness of breath: Secondary | ICD-10-CM

## 2018-03-17 DIAGNOSIS — E669 Obesity, unspecified: Secondary | ICD-10-CM

## 2018-03-17 DIAGNOSIS — R0600 Dyspnea, unspecified: Secondary | ICD-10-CM

## 2018-03-17 LAB — PULMONARY FUNCTION TEST
DL/VA % pred: 89 %
DL/VA: 4.29 ml/min/mmHg/L
DLCO unc % pred: 66 %
DLCO unc: 16.12 ml/min/mmHg
FEF 25-75 Post: 1.33 L/sec
FEF 25-75 Pre: 1.98 L/sec
FEF2575-%Change-Post: -32 %
FEF2575-%Pred-Post: 67 %
FEF2575-%Pred-Pre: 100 %
FEV1-%Change-Post: -8 %
FEV1-%Pred-Post: 72 %
FEV1-%Pred-Pre: 79 %
FEV1-Post: 1.68 L
FEV1-Pre: 1.83 L
FEV1FVC-%Change-Post: -2 %
FEV1FVC-%Pred-Pre: 109 %
FEV6-%Change-Post: -5 %
FEV6-%Pred-Post: 71 %
FEV6-%Pred-Pre: 75 %
FEV6-Post: 2.07 L
FEV6-Pre: 2.2 L
FEV6FVC-%Pred-Post: 104 %
FEV6FVC-%Pred-Pre: 104 %
FVC-%Change-Post: -5 %
FVC-%Pred-Post: 68 %
FVC-%Pred-Pre: 72 %
FVC-Post: 2.07 L
FVC-Pre: 2.2 L
Post FEV1/FVC ratio: 81 %
Post FEV6/FVC ratio: 100 %
Pre FEV1/FVC ratio: 83 %
Pre FEV6/FVC Ratio: 100 %

## 2018-03-17 MED ORDER — FUROSEMIDE 20 MG PO TABS: 20.0000 mg | ORAL_TABLET | Freq: Every day | ORAL | 3 refills | Status: DC

## 2018-03-17 NOTE — Progress Notes (Signed)
PFT done today. 

## 2018-03-17 NOTE — Progress Notes (Signed)
Subjective:     [name], female   DOB: 1951-01-07, 67 y.o.   MRN: [id number]  HPI  PCP [name], MD   HPI  IOV 01/21/2018  Chief Complaint  Patient presents with  . Consult    SOB w/exertion and while resting, patient reports the tightness she feels during SOB radiates across her neck and chest. Last chest x ray August 2018. PICC line removed Monday for knee infection (ceftriaxone).     Sheri [name] is a 67 year old lady presenting for consultation for chronic shortness of breath.  She tells me that she suffers from obesity with a BMI close to 35 and associated hypertension and diabetes.  She is a non-smoker.  She has insidious onset of shortness of breath for the last 3 years.  She describes the shortness of breath as chest tightness in her upper chest radiating into the neck and back of the neck.  Because of this she did have a cardiac stress test in December 2017 that I personally reviewed the result it is normal with an ejection fraction of 66% [in 2012 review of the chart shows she had an echocardiogram that is reported as normal but no comment on diastolic dysfunction].  After that she did not really notice shortness of breath because her health issues have been complicated by occurrence of group B streptococcus right knee infection and sepsis.  She has battled this for most of 2018 requiring chronic antibiotic therapy.  Subsequently in early 2019 there was reinfection of this and she has had all the prosthesis removed and now has a spacer followed by prolonged antibiotic therapy ending 3 days ago.  She uses a walker now to get around.  She is due for a total knee replacement at some point in time in the future.  Her orthopedic surgeon is Dr. Lucienne Capers.  She says that she has been instructed to re-exert herself and recently while walking at [facility] after doing groceries her symptoms record.  The same thing happened a few years ago before the knee problems  while she walked to football for lengths to see her daughter who had a hysterectomy.  There is no diaphoresis.  Is no chest pain orthopnea proximal nocturnal dyspnea wheezing or weight loss.  She has never tried albuterol for the same   Personal visualization of a CT scan abdomen lung cuts: Shows healthy lung base.  Personal visualization of the chest x-ray agree with the formal report in August 2018 shows clear lung  Stress test dec 2017 - normal with ef 66%   OV 03/17/2018  Chief Complaint  Patient presents with  . Follow-up    Echo performed 5/30 and PFT performed today.  Pt states she has been doing okay since last visit. Pt states she has another UTI and also states she believes she is retaining fluid and also states SOB is the same. Pt is scheduled to have knee surgery September 25 and is needing surgical clearance.    For shortness of breath with suspicion of diastolic dysfunction related to obesity and physical deconditioning in the setting of recurrent right knee infection   Sheri [name]  Bronchitic changes - on interim chest x-ray. She had an echocardiogram that showed grade 1 diastolic dysfunction. She had pulmonary function test today that shows restriction with reduced diffusion capacity. Her shortness of breath continues unchanged. In the interim she is also gained fluid with edema. She feels Lasix will help. She is also dealing with  recurrent urinary infection. She is due for right knee replacement 05/26/2018 but this could simply be earlier. She wants a preop clearance.  Simple office walk 185 feet x  3 laps goal with forehead probe 01/21/2018   O2 used none  Number laps completed 3  Comments about pace Slow pace with walker due to knee spacer  Resting Pulse Ox/HR 100% and 70/min  Final Pulse Ox/HR 100% and 117/min  Desaturated </= 88% no  Desaturated <= 3% points no  Got Tachycardic >/= 90/min yes  Symptoms at end of test x  Miscellaneous comments Used walker    Results for Sheri [name] (MRN [id number]) as of 03/17/2018 11:01  Ref. Range 03/17/2018 09:33  FVC-Pre Latest Units: L 2.20  FVC-%Pred-Pre Latest Units: % 72  Results for Sheri [name] (MRN [id number]) as of 03/17/2018 11:01  Ref. Range 03/17/2018 09:33  Pre FEV1/FVC ratio Latest Units: % 83  Results for Sheri [name] (MRN [id number]) as of 03/17/2018 11:01  Ref. Range 03/17/2018 09:33  DLCO unc Latest Units: ml/min/mmHg 16.12  DLCO unc % pred Latest Units: % 66     has a past medical history of Allergic rhinitis, Degenerative disc disease, cervical, Diabetes mellitus without complication ([place]), Diarrhea (04/2012), H/O CT scan of abdomen (08/2012), H/O ulcer disease, Heart murmur, History of blood transfusion, Hyperlipidemia, Hyperplastic polyps of stomach, Hypertension, Hypothyroidism, IBS (irritable bowel syndrome), Kissing, osteophytes, Menopause, Mild anemia (06/2011), Obesity, Osteoarthritis, and PONV (postoperative nausea and vomiting).   reports that she has never smoked. She has never used smokeless tobacco.  Past Surgical History:  Procedure Laterality Date  . CESAREAN SECTION    . CHOLECYSTECTOMY    . COLONOSCOPY  2/06,04/20/12 repeat 10 years   dr Cristina Gong  . EXCISIONAL TOTAL KNEE ARTHROPLASTY WITH ANTIBIOTIC SPACERS Right 11/23/2017   Procedure: Right knee resection arthroplasty; antibiotic spacer;  Surgeon: Gaynelle Arabian, MD;  Location: [facility];  Service: Orthopedics;  Laterality: Right;  . I&D KNEE WITH POLY EXCHANGE Right 01/22/2017   Procedure: IRRIGATION AND DEBRIDEMENT KNEE WITH POLY EXCHANGE;  Surgeon: Paralee Cancel, MD;  Location: [facility];  Service: Orthopedics;  Laterality: Right;  . JOINT REPLACEMENT     right knee   . TOTAL KNEE ARTHROPLASTY Left 02/12/2015   Procedure: LEFT TOTAL KNEE ARTHROPLASTY;  Surgeon: Gaynelle Arabian, MD;  Location: [facility];  Service: Orthopedics;  Laterality: Left;    No Known Allergies  Immunization History  Administered  Date(s) Administered  . Influenza-Unspecified 06/16/2017  . Zoster Recombinat (Shingrix) 10/05/2017, 02/09/2018    Family History  Problem Relation Age of Onset  . Other Mother 35       sepsis of unknown etiology  . Lung cancer Father 64  . Diabetes Mellitus II Father   . Atrial fibrillation Father      Current Outpatient Medications:  .  acetaminophen (TYLENOL) 650 MG CR tablet, Take 1,300 mg by mouth 2 (two) times daily. , Disp: , Rfl:  .  albuterol (PROVENTIL HFA;VENTOLIN HFA) 108 (90 Base) MCG/ACT inhaler, Inhale 2 puffs into the lungs every 6 (six) hours as needed for wheezing or shortness of breath., Disp: 1 Inhaler, Rfl: 6 .  amLODipine-benazepril (LOTREL) 5-20 MG per capsule, Take 1 capsule by mouth every morning. , Disp: , Rfl:  .  aspirin 81 MG chewable tablet, Chew by mouth., Disp: , Rfl:  .  meloxicam (MOBIC) 15 MG tablet, Take by mouth., Disp: , Rfl:  .  pantoprazole (PROTONIX) 40 MG tablet, Take 40 mg  by mouth daily. , Disp: , Rfl:  .  sitaGLIPtin (JANUVIA) 100 MG tablet, Take 100 mg by mouth daily., Disp: , Rfl:  .  SYNTHROID 112 MCG tablet, Take 1125 mcg by mouth daily, Disp: , Rfl: 3 .  traMADol (ULTRAM) 50 MG tablet, Take 1-2 tablets (50-100 mg total) by mouth every 6 (six) hours as needed (mild pain)., Disp: 56 tablet, Rfl: 0   Review of Systems     Objective:   Physical Exam Vitals:   03/17/18 1048  BP: 126/78  Pulse: 67  SpO2: 98%  Weight: 221 lb (100.2 kg)  Height: 5\' 4"  (1.626 m)   Obese female sitting comfortably. Has right knee loader. Has a walker.Normal breath sounds with? Crackles at the lung base that are faint overall diminished air entry.    Assessment:       ICD-10-CM   1. Dyspnea on exertion R06.09   2. Obesity (BMI 30-39.9) E66.9   3. Abnormal PFT R94.2   4. Pedal edema R60.0        Plan:      Will treat for diastolic dysfunction But based on PFT result will need to rule out ILD  Plan Start lasix 20mg  daily (weight 03/17/2018  - 221#) No need for potassium supplementation due to benazepril intake Do HRCT supine and prone in 3-4 weeks after diuresis  Follolwup - do bmet in 3-4 weeks at time of followup - return to see DR [name] in 3-4 weeks to review progress and preop clearance for knee surgery  (> 50% of this 15 min visit spent in face to face counseling or/and coordination of care by this undersigned MD - Dr Brand Males. This includes one or more of the following documented above: discussion of test results, diagnostic or treatment recommendations, prognosis, risks and benefits of management options, instructions, education, compliance or risk-factor reduction)   Dr. Brand Males, M.D., [facility].C.P Pulmonary and Critical Care Medicine Staff Physician, [place] Director - Interstitial Lung Disease  Program  Pulmonary [place] at [place], [place], [address]  Pager: [phone], If no answer or between  15:00h - 7:00h: call [phone] Telephone: [phone]

## 2018-03-17 NOTE — Patient Instructions (Addendum)
ICD-10-CM   1. Dyspnea on exertion R06.09   2. Obesity (BMI 30-39.9) E66.9   3. Abnormal PFT R94.2   4. Pedal edema R60.0     Will treat for diastolic dysfunction But based on PFT result will need to rule out ILD  Plan Start lasix 20mg  daily (weight 03/17/2018 - 221#) No need for potassium supplementation due to benazepril intake Do HRCT supine and prone in 3-4 weeks  Follolwup - do bmet in 3-4 weeks at time of followup - return to see Sheri [name] in 3-4 weeks to review progress and preop clearance for knee surgery

## 2018-03-22 ENCOUNTER — Ambulatory Visit: Payer: Medicare Other | Admitting: Infectious Diseases

## 2018-03-29 ENCOUNTER — Other Ambulatory Visit: Payer: Medicare Other

## 2018-03-31 ENCOUNTER — Ambulatory Visit: Payer: Medicare Other | Admitting: Infectious Diseases

## 2018-04-05 ENCOUNTER — Ambulatory Visit
Admission: RE | Admit: 2018-04-05 | Discharge: 2018-04-05 | Disposition: A | Discharge status: Home / Self Care | Payer: Medicare Other | Source: Ambulatory Visit | Attending: Internal Medicine | Admitting: Internal Medicine

## 2018-04-05 DIAGNOSIS — R0602 Shortness of breath: Secondary | ICD-10-CM

## 2018-04-05 DIAGNOSIS — R918 Other nonspecific abnormal finding of lung field: Secondary | ICD-10-CM | POA: Diagnosis not present

## 2018-04-08 DIAGNOSIS — E119 Type 2 diabetes mellitus without complications: Secondary | ICD-10-CM | POA: Diagnosis not present

## 2018-04-19 ENCOUNTER — Ambulatory Visit: Payer: Medicare Other | Admitting: Internal Medicine

## 2018-04-19 ENCOUNTER — Encounter: Payer: Self-pay | Admitting: Infectious Diseases

## 2018-04-19 ENCOUNTER — Ambulatory Visit (INDEPENDENT_AMBULATORY_CARE_PROVIDER_SITE_OTHER): Payer: Medicare Other | Admitting: Infectious Diseases

## 2018-04-19 DIAGNOSIS — T8453XD Infection and inflammatory reaction due to internal right knee prosthesis, subsequent encounter: Secondary | ICD-10-CM | POA: Diagnosis not present

## 2018-04-19 DIAGNOSIS — E039 Hypothyroidism, unspecified: Secondary | ICD-10-CM | POA: Diagnosis not present

## 2018-04-19 DIAGNOSIS — N390 Urinary tract infection, site not specified: Secondary | ICD-10-CM | POA: Diagnosis not present

## 2018-04-19 DIAGNOSIS — B952 Enterococcus as the cause of diseases classified elsewhere: Secondary | ICD-10-CM | POA: Diagnosis not present

## 2018-04-19 DIAGNOSIS — R350 Frequency of micturition: Secondary | ICD-10-CM | POA: Diagnosis not present

## 2018-04-19 NOTE — Progress Notes (Signed)
   Subjective:    [name], female    DOB: June 26, 1951, 67 y.o.   MRN: [id number]  HPI 67 yo F with DM2, who had R knee synovectomy anf removal of parts on 11-23-17 after her outpt Cx grew Group B  She was d/c home on 11-25-17 on ceftriaxone to complete on 01-17-18.  Her ESR was 77 and CRP 2.9 in hospital.                          ESR                 CRP 12-01-17              99                    29.8 12-13-17            114                  22 01-12-18            53                    6.7    She completed her anbx 01-19-18.  "My knee is hanging in there". The spacer is shifting a little puffiness.  C/o fatigue, mild nausea, feels like she may have UTI. Also having back pain. She was seen by PCP June and was treated with cipro for 1 week. She has f/u appt today.  05-26-18 is going to have new R TKR, removal of spacer.   Last A1C was 7% She recently needed to have her synthroid adjusted.   Review of Systems  Constitutional: Positive for chills. Negative for appetite change and fever.  Gastrointestinal: Positive for nausea. Negative for constipation and diarrhea.  Genitourinary: Positive for frequency. Negative for difficulty urinating, dysuria and hematuria.  Musculoskeletal: Positive for arthralgias and back pain.       Objective:   Physical Exam  Constitutional: She appears well-developed and well-nourished. No distress.  HENT:  Mouth/Throat: No oropharyngeal exudate.  Eyes: Pupils are equal, round, and reactive to light. EOM are normal.  Neck: Normal range of motion. Neck supple.  Cardiovascular: Normal rate, regular rhythm and normal heart sounds.  Pulmonary/Chest: Effort normal and breath sounds normal. No respiratory distress.  Abdominal: Soft. Bowel sounds are normal. She exhibits no distension. There is no tenderness.  Musculoskeletal:       Legs: Lymphadenopathy:    She has no cervical adenopathy.  Psychiatric: She has a normal mood and affect.           Assessment & Plan:

## 2018-04-19 NOTE — Assessment & Plan Note (Signed)
She is excited for removal of spacer and new knee.  Will repeat her CRP and ESR If these are elevated would consider at least rechecking her Cx at time of surgery.  Will call her with results.

## 2018-04-20 DIAGNOSIS — R399 Unspecified symptoms and signs involving the genitourinary system: Secondary | ICD-10-CM | POA: Diagnosis not present

## 2018-04-20 LAB — C-REACTIVE PROTEIN: CRP: 5.3 mg/L (ref <8.0)

## 2018-04-20 LAB — SEDIMENTATION RATE: Sed Rate: 14 mm/h (ref 0–30)

## 2018-04-22 ENCOUNTER — Ambulatory Visit: Payer: Medicare Other | Admitting: Internal Medicine

## 2018-04-28 ENCOUNTER — Telehealth: Payer: Self-pay | Admitting: Infectious Diseases

## 2018-04-28 NOTE — Telephone Encounter (Signed)
Called pt to discuss her labs, her most recent labs.  She has been doing well except for a recent UTI. Her Cx was negative.     4-2 4-14 5-14 8-19 ESR  99 114 53 14 CRP  29.8 22 6.7 5.3  Her labs have improved and she is ready for surgery (scheduled 05-26-18) With regards to her UTI, she had a negative Cx. I spoke to her about retaining urine as we get older (retention) and that sometimes bacteria get in there (colonization).  She will most likely get Ancef for her perioperative antibiotics, I explained that this is currently the drug of choice for UTI.  Will cc Dr Maureen Ralphs

## 2018-05-04 NOTE — H&P (Signed)
TOTAL KNEE ADMISSION H&P  Patient is being admitted for right total knee arthroplasty reimplantation.  Subjective:  Chief Complaint: Right total knee resection arthroplasty  HPI: The patient is a 67 y/o female who presents for pre-operative visit in preparation for their right total knee arthroplasty reimplantation, which is scheduled on 05/26/2018 with [name] at [facility]. She had a right total knee arthroplasty resection performed on 11/23/2017 and completed IV antibiotics in 12/2017. She currently has an antibiotic spacer placed which only allows ROM of 0-60 degrees. Current symptoms in the right knee include occasional discomfort and a feeling of "shifting" in the knee. The patient denies an active infection.  Patient Active Problem List   Diagnosis Date Noted  . Thrombocytosis ([place]) 12/28/2017  . Medication monitoring encounter 06/17/2017  . Vaginal candidiasis 03/02/2017  . Status post peripherally inserted central catheter (PICC) central line placement 03/02/2017  . Group B streptococcal infection   . Infection of total right knee replacement ([place]) 01/22/2017  . OA (osteoarthritis) of knee 02/12/2015  . Essential hypertension, benign 11/10/2013  . Pure hypercholesterolemia 11/10/2013  . Anemia 08/13/2011   Past Medical History:  Diagnosis Date  . Allergic rhinitis   . Degenerative disc disease, cervical   . Diabetes mellitus without complication ([place])    type two  . Diarrhea 04/2012   colon nl, BX NPD; no response to cholestyramin 03-2012 no response to align; transient resp to sucralfate 12-13  . H/O CT scan of abdomen 08/2012   neg; egd bile reflux gastritisand linear eros; transient resp to sucralfate   . H/O ulcer disease   . Heart murmur    hx of " comes and goes"   . History of blood transfusion   . Hyperlipidemia   . Hyperplastic polyps of stomach   . Hypertension   . Hypothyroidism   . IBS (irritable bowel syndrome)   . Kissing, osteophytes    Dr Carloyn Manner  . Menopause   . Mild anemia 06/2011   14% iron sat,ferritin 14- from Metformin - anemia resolved   . Obesity   . Osteoarthritis   . PONV (postoperative nausea and vomiting)    years ago     Past Surgical History:  Procedure Laterality Date  . CESAREAN SECTION    . CHOLECYSTECTOMY    . COLONOSCOPY  2/06,04/20/12 repeat 10 years   dr Cristina Gong  . EXCISIONAL TOTAL KNEE ARTHROPLASTY WITH ANTIBIOTIC SPACERS Right 11/23/2017   Procedure: Right knee resection arthroplasty; antibiotic spacer;  Surgeon: Gaynelle Arabian, MD;  Location: [facility];  Service: Orthopedics;  Laterality: Right;  . I&D KNEE WITH POLY EXCHANGE Right 01/22/2017   Procedure: IRRIGATION AND DEBRIDEMENT KNEE WITH POLY EXCHANGE;  Surgeon: Paralee Cancel, MD;  Location: [facility];  Service: Orthopedics;  Laterality: Right;  . JOINT REPLACEMENT     right knee   . TOTAL KNEE ARTHROPLASTY Left 02/12/2015   Procedure: LEFT TOTAL KNEE ARTHROPLASTY;  Surgeon: Gaynelle Arabian, MD;  Location: [facility];  Service: Orthopedics;  Laterality: Left;    No current facility-administered medications for this encounter.    Current Outpatient Medications  Medication Sig Dispense Refill Last Dose  . acetaminophen (TYLENOL) 650 MG CR tablet Take 1,300 mg by mouth 2 (two) times daily.    Taking  . albuterol (PROVENTIL HFA;VENTOLIN HFA) 108 (90 Base) MCG/ACT inhaler Inhale 2 puffs into the lungs every 6 (six) hours as needed for wheezing or shortness of breath. 1 Inhaler 6 Taking  . amLODipine-benazepril (LOTREL) 5-20 MG per  capsule Take 1 capsule by mouth every morning.    Taking  . aspirin 81 MG chewable tablet Chew by mouth.   Taking  . furosemide (LASIX) 20 MG tablet Take 1 tablet (20 mg total) by mouth daily. 30 tablet 3 Taking  . meloxicam (MOBIC) 15 MG tablet Take by mouth.   Taking  . pantoprazole (PROTONIX) 40 MG tablet Take 40 mg by mouth daily.    Taking  . sitaGLIPtin (JANUVIA) 100 MG tablet Take 100 mg by mouth daily.   Taking  . SYNTHROID  112 MCG tablet Take 1125 mcg by mouth daily  3 Taking  . traMADol (ULTRAM) 50 MG tablet Take 1-2 tablets (50-100 mg total) by mouth every 6 (six) hours as needed (mild pain). 56 tablet 0 Taking   No Known Allergies  Social History   Tobacco Use  . Smoking status: Never Smoker  . Smokeless tobacco: Never Used  Substance Use Topics  . Alcohol use: No    Family History  Problem Relation Age of Onset  . Other Mother 49       sepsis of unknown etiology  . Lung cancer Father 65  . Diabetes Mellitus II Father   . Atrial fibrillation Father      Review of Systems  Constitutional: Negative for chills and fever.  HENT: Negative for congestion, sore throat and tinnitus.   Eyes: Negative for double vision, photophobia and pain.  Respiratory: Negative for cough, shortness of breath and wheezing.   Cardiovascular: Negative for chest pain, palpitations and orthopnea.  Gastrointestinal: Negative for heartburn, nausea and vomiting.  Genitourinary: Negative for dysuria, frequency and urgency.  Musculoskeletal: Negative for joint pain.  Neurological: Negative for dizziness, weakness and headaches.  Psychiatric/Behavioral: Negative for depression.    Objective:  Physical Exam  Well nourished and well developed. General: Alert and oriented x3, cooperative and pleasant, no acute distress. Head: normocephalic, atraumatic, neck supple. Eyes: EOMI. Respiratory: breath sounds clear in all fields, no wheezing, rales, or rhonchi. Cardiovascular: Regular rate and rhythm, no murmurs, gallops or rubs.  Abdomen: non-tender to palpation and soft, normoactive bowel sounds. Musculoskeletal: Right Knee Exam: No swelling whatsoever, no warmth. No effusion. Well healed incision, dry with no drainage and without tenderness. She has an articulating spacer so she can only flex to 60 degrees. Knee is significantly stable. Calves soft and nontender. Motor function intact in LE. Strength 5/5 LE  bilaterally. Neuro: Distal pulses 2+. Sensation to light touch intact in LE.  Vital signs in last 24 hours: Blood pressure: 148/84 mmHg Labs:   Estimated body mass index is 36.44 kg/m as calculated from the following:   Height as of 04/19/18: _0  (1.651 m).   Weight as of 04/19/18: 99.3 kg.   Preoperative templating of the joint replacement has been completed, documented, and submitted to the Operating Room personnel in order to optimize intra-operative equipment management.   Assessment/Plan:  Assessment: Right knee resection arthroplasty  Plan: Right total knee arthroplasty reimplantation scheduled for 05/26/2018 at [facility]. The treatment options including medical management, injection therapy and arthroplasty were discussed in length. The risks and benefits were presented and reviewed. The risks due to persistent prosthetic joint infection, aseptic loosening, stiffness, thromboembolic complications and patella tracking problems were discussed. The patient acknowledged the explanation, agreed to proceed with the plan and consent was signed. Patient is being admitted for inpatient treatment for surgery, pain control, PT, OT, prophylactic antibiotics, VTE prophylaxis, progressive ambulation and ADL's and discharge planning. The  patient is planning to be discharged home with home health services.   Therapy Plans: HHPT Disposition: Home with sister Planned DVT Prophylaxis: Aspirin 325 mg BID DME needed: None PCP: Dr. Jeremy Johann (medical clearance provided) TXA: IV Allergies: None Last HgbA1c: 6.0%  - Patient was instructed on what medications to stop prior to surgery. - Follow-up visit in 2 weeks with [name] - Begin physical therapy following surgery - Pre-operative lab work as pre-surgical testing - Prescriptions will be provided in hospital at time of discharge  Theresa Duty, PA-C Orthopedic Surgery [facility] [place]

## 2018-05-06 ENCOUNTER — Ambulatory Visit (INDEPENDENT_AMBULATORY_CARE_PROVIDER_SITE_OTHER): Payer: Medicare Other | Admitting: Internal Medicine

## 2018-05-06 ENCOUNTER — Encounter: Payer: Self-pay | Admitting: Internal Medicine

## 2018-05-06 VITALS — BP 102/68 | HR 78 | Ht 64.0 in | Wt 220.0 lb

## 2018-05-06 DIAGNOSIS — R6889 Other general symptoms and signs: Secondary | ICD-10-CM

## 2018-05-06 DIAGNOSIS — E669 Obesity, unspecified: Secondary | ICD-10-CM

## 2018-05-06 DIAGNOSIS — R918 Other nonspecific abnormal finding of lung field: Secondary | ICD-10-CM

## 2018-05-06 DIAGNOSIS — Z23 Encounter for immunization: Secondary | ICD-10-CM | POA: Diagnosis not present

## 2018-05-06 DIAGNOSIS — I5189 Other ill-defined heart diseases: Secondary | ICD-10-CM | POA: Diagnosis not present

## 2018-05-06 DIAGNOSIS — H2513 Age-related nuclear cataract, bilateral: Secondary | ICD-10-CM | POA: Diagnosis not present

## 2018-05-06 DIAGNOSIS — Z01811 Encounter for preprocedural respiratory examination: Secondary | ICD-10-CM | POA: Diagnosis not present

## 2018-05-06 DIAGNOSIS — R0989 Other specified symptoms and signs involving the circulatory and respiratory systems: Secondary | ICD-10-CM

## 2018-05-06 DIAGNOSIS — R0600 Dyspnea, unspecified: Secondary | ICD-10-CM

## 2018-05-06 DIAGNOSIS — R0609 Other forms of dyspnea: Secondary | ICD-10-CM | POA: Diagnosis not present

## 2018-05-06 DIAGNOSIS — E119 Type 2 diabetes mellitus without complications: Secondary | ICD-10-CM | POA: Diagnosis not present

## 2018-05-06 NOTE — Progress Notes (Signed)
PCP Sheri Ada, MD   HPI  IOV 01/21/2018  Chief Complaint  Patient presents with  . Consult    SOB w/exertion and while resting, patient reports the tightness she feels during SOB radiates across her neck and chest. Last chest x ray August 2018. PICC line removed Monday for knee infection (ceftriaxone).     Sheri [name] is a 67 year old lady presenting for consultation for chronic shortness of breath.  She tells me that she suffers from obesity with a BMI close to 35 and associated hypertension and diabetes.  She is a non-smoker.  She has insidious onset of shortness of breath for the last 3 years.  She describes the shortness of breath as chest tightness in her upper chest radiating into the neck and back of the neck.  Because of this she did have a cardiac stress test in December 2017 that I personally reviewed the result it is normal with an ejection fraction of 66% [in 2012 review of the chart shows she had an echocardiogram that is reported as normal but no comment on diastolic dysfunction].  After that she did not really notice shortness of breath because her health issues have been complicated by occurrence of group B streptococcus right knee infection and sepsis.  She has battled this for most of 2018 requiring chronic antibiotic therapy.  Subsequently in early 2019 there was reinfection of this and she has had all the prosthesis removed and now has a spacer followed by prolonged antibiotic therapy ending 3 days ago.  She uses a walker now to get around.  She is due for a total knee replacement at some point in time in the future.  Her orthopedic surgeon is Dr. Lucienne Capers.  She says that she has been instructed to re-exert herself and recently while walking at [facility] after doing groceries her symptoms record.  The same thing happened a few years ago before the knee problems while she walked to football for lengths to see her daughter who had a hysterectomy.  There is no  diaphoresis.  Is no chest pain orthopnea proximal nocturnal dyspnea wheezing or weight loss.  She has never tried albuterol for the same   Personal visualization of a CT scan abdomen lung cuts: Shows healthy lung base.  Personal visualization of the chest x-ray agree with the formal report in August 2018 shows clear lung  Stress test dec 2017 - normal with ef 66%   OV 03/17/2018  Chief Complaint  Patient presents with  . Follow-up    Echo performed 5/30 and PFT performed today.  Pt states she has been doing okay since last visit. Pt states she has another UTI and also states she believes she is retaining fluid and also states SOB is the same. Pt is scheduled to have knee surgery September 25 and is needing surgical clearance.    For shortness of breath with suspicion of diastolic dysfunction related to obesity and physical deconditioning in the setting of recurrent right knee infection   Sheri [name]  Bronchitic changes - on interim chest x-ray. She had an echocardiogram that showed grade 1 diastolic dysfunction. She had pulmonary function test today that shows restriction with reduced diffusion capacity. Her shortness of breath continues unchanged. In the interim she is also gained fluid with edema. She feels Lasix will help. She is also dealing with recurrent urinary infection. She is due for right knee replacement 05/26/2018 but this could simply be earlier. She wants a  preop clearance.  OV 05/06/2018  Subjective:  [name], female , DOB: 01-18-1951 , age 67 y.o. , MRN: [id number] , ADDRESS: [address] [place] [address]   05/06/2018 -   Chief Complaint  Patient presents with  . Follow-up    HRCT performed 8/5.  Doing better she feels the lasix is helping. She feels that there is something in her thoart that is making SOB worse and causes problems eating.     HPI Sheri [name] 67 y.o. -morbidly obese female here for shortness of breath  that clinically is related to obesity and chronic diastolic dysfunction. After starting Lasix she reports she is much better. Improved dyspnea. HRCT aug 2019 ruled out ILD. Her other issues are / J  A. preop pulm clearance -has knee surgery 05/26/18 - she is ready for it B. New issue - multple < 25mm nodules aug 2019 CT C. Reports chronic throast tightness - random and worse with eating some foods. No dysphgaia. Feels associted with dyspnea. No prior ENT eval   Simple office walk 185 feet x  3 laps goal with forehead probe 01/21/2018   O2 used none  Number laps completed 3  Comments about pace Slow pace with walker due to knee spacer  Resting Pulse Ox/HR 100% and 70/min  Final Pulse Ox/HR 100% and 117/min  Desaturated </= 88% no  Desaturated <= 3% points no  Got Tachycardic >/= 90/min yes  Symptoms at end of test x  Miscellaneous comments Used walker   Results for Sheri, [name] (MRN [id number]) as of 03/17/2018 11:01  Ref. Range 03/17/2018 09:33  FVC-Pre Latest Units: L 2.20  FVC-%Pred-Pre Latest Units: % 72  Results for Sheri, [name] (MRN [id number]) as of 03/17/2018 11:01  Ref. Range 03/17/2018 09:33  Pre FEV1/FVC ratio Latest Units: % 83  Results for Sheri, [name] (MRN [id number]) as of 03/17/2018 11:01  Ref. Range 03/17/2018 09:33  DLCO unc Latest Units: ml/min/mmHg 16.12  DLCO unc % pred Latest Units: % 66     ROS - per HPI     has a past medical history of Allergic rhinitis, Degenerative disc disease, cervical, Diabetes mellitus without complication ([place]), Diarrhea (04/2012), H/O CT scan of abdomen (08/2012), H/O ulcer disease, Heart murmur, History of blood transfusion, Hyperlipidemia, Hyperplastic polyps of stomach, Hypertension, Hypothyroidism, IBS (irritable bowel syndrome), Kissing, osteophytes, Menopause, Mild anemia (06/2011), Obesity, Osteoarthritis, and PONV (postoperative nausea and vomiting).   reports that she has never smoked. She has never used  smokeless tobacco.  Past Surgical History:  Procedure Laterality Date  . CESAREAN SECTION    . CHOLECYSTECTOMY    . COLONOSCOPY  2/06,04/20/12 repeat 10 years   dr Cristina Gong  . EXCISIONAL TOTAL KNEE ARTHROPLASTY WITH ANTIBIOTIC SPACERS Right 11/23/2017   Procedure: Right knee resection arthroplasty; antibiotic spacer;  Surgeon: Gaynelle Arabian, MD;  Location: [facility];  Service: Orthopedics;  Laterality: Right;  . I&D KNEE WITH POLY EXCHANGE Right 01/22/2017   Procedure: IRRIGATION AND DEBRIDEMENT KNEE WITH POLY EXCHANGE;  Surgeon: Paralee Cancel, MD;  Location: [facility];  Service: Orthopedics;  Laterality: Right;  . JOINT REPLACEMENT     right knee   . TOTAL KNEE ARTHROPLASTY Left 02/12/2015   Procedure: LEFT TOTAL KNEE ARTHROPLASTY;  Surgeon: Gaynelle Arabian, MD;  Location: [facility];  Service: Orthopedics;  Laterality: Left;    No Known Allergies  Immunization History  Administered Date(s) Administered  . Influenza, High Dose Seasonal PF 05/06/2018  . Influenza-Unspecified  06/16/2017  . Zoster Recombinat (Shingrix) 10/05/2017, 02/09/2018    Family History  Problem Relation Age of Onset  . Other Mother 50       sepsis of unknown etiology  . Lung cancer Father 14  . Diabetes Mellitus II Father   . Atrial fibrillation Father      Current Outpatient Medications:  .  acetaminophen (TYLENOL) 650 MG CR tablet, Take 1,300 mg by mouth 2 (two) times daily. , Disp: , Rfl:  .  albuterol (PROVENTIL HFA;VENTOLIN HFA) 108 (90 Base) MCG/ACT inhaler, Inhale 2 puffs into the lungs every 6 (six) hours as needed for wheezing or shortness of breath., Disp: 1 Inhaler, Rfl: 6 .  amLODipine-benazepril (LOTREL) 5-20 MG per capsule, Take 1 capsule by mouth every morning. , Disp: , Rfl:  .  aspirin 81 MG chewable tablet, Chew by mouth., Disp: , Rfl:  .  furosemide (LASIX) 20 MG tablet, Take 1 tablet (20 mg total) by mouth daily., Disp: 30 tablet, Rfl: 3 .  meloxicam (MOBIC) 15 MG tablet, Take by mouth., Disp: ,  Rfl:  .  pantoprazole (PROTONIX) 40 MG tablet, Take 40 mg by mouth daily. , Disp: , Rfl:  .  sitaGLIPtin (JANUVIA) 100 MG tablet, Take 100 mg by mouth daily., Disp: , Rfl:  .  SYNTHROID 112 MCG tablet, 125 mcg. , Disp: , Rfl: 3 .  traMADol (ULTRAM) 50 MG tablet, Take 1-2 tablets (50-100 mg total) by mouth every 6 (six) hours as needed (mild pain)., Disp: 56 tablet, Rfl: 0      Objective:   Vitals:   05/06/18 1029  BP: 102/68  Pulse: 78  SpO2: 97%  Weight: 220 lb (99.8 kg)  Height: 5\' 4"  (1.626 m)    Estimated body mass index is 37.76 kg/m as calculated from the following:   Height as of this encounter: 5\' 4"  (1.626 m).   Weight as of this encounter: 220 lb (99.8 kg).  @WEIGHTCHANGE @  [employer]   05/06/18 1029  Weight: 220 lb (99.8 kg)     Physical Exam  General Appearance:    Alert, cooperative, no distress, appears stated age - yes , sitting on - chair but has walker. OBESE +  Head:    Normocephalic, without obvious abnormality, atraumatic  Eyes:    PERRL, conjunctiva/corneas clear,  Ears:    Normal TM's and external ear canals, both ears  Nose:   Nares normal, septum midline, mucosa normal, no drainage    or sinus tenderness. OXYGEN ON  - no . Patient is @ room air   Throat:   Lips, mucosa, and tongue normal; teeth and gums normal. Cyanosis on lips - no  Neck:   Supple, symmetrical, trachea midline, no adenopathy;    thyroid:  no enlargement/tenderness/nodules; no carotid   bruit or JVD  Back:     Symmetric, no curvature, ROM normal, no CVA tenderness  Lungs:     Distress - no , Wheeze no, Barrell Chest - no, Purse lip breathing - no, Crackles - no   Chest Wall:    No tenderness or deformity. Scars in chest no   Heart:    Regular rate and rhythm, S1 and S2 normal, no murmur, rub   or gallop  Breast Exam:    NOT DONE  Abdomen:     Soft, non-tender, bowel sounds active all four quadrants,    no masses, no organomegaly  Genitalia:   NOT DONE  Rectal:   NOT DONE  Extremities:   Extremities normal, atraumatic, Clubbing - no, Edema - no  Pulses:   2+ and symmetric all extremities  Skin:   Stigmata of Connective Tissue Disease - no  Lymph nodes:   Cervical, supraclavicular, and axillary nodes normal  Psychiatric:  Neurologic:   plesant CNII-XII intact, normal strength, sensation  throughout           Assessment:       ICD-10-CM   1. Dyspnea on exertion R06.09   2. Encounter for immunization Z23 Flu vaccine HIGH DOSE PF  3. Obesity (BMI 30-39.9) E66.9   4. Diastolic dysfunction E33.29   5. Preop pulmonary/respiratory exam Z01.811   6. Multiple lung nodules on CT R91.8        Plan:     Dyspnea on exertion Obesity (BMI 30-39.9) Diastolic dysfunction  - glad beter with lasix  - no evidence of ILD on HRCT aug 2019 - dyspnea from weight and stiff heart muscle   Preop pulmonary/respiratory exam  - ok for knee surgery with low mderate risk for pulmonary complication - prevention of DVT and early mobilization key  Multiple lung nodules on CT - small nodules aug 2019  - repeat CT chest without contrast sep 2020  Encounter for immunization - Plan: Flu vaccine HIGH DOSE PF       Throast tightness  - unclear cause  - ENT referrral     SIGNATURE    Dr. Brand Males, M.D., F.C.C.P,  Pulmonary and Critical Care Medicine Staff Physician, [place] Director - Interstitial Lung Disease  Program  [facility] [place], [place], [address]  Pager: [phone], If no answer or between  15:00h - 7:00h: call [phone] Telephone: [phone]  11:08 AM 05/06/2018

## 2018-05-06 NOTE — Patient Instructions (Addendum)
Dyspnea on exertion Obesity (BMI 30-39.9) Diastolic dysfunction  - glad beter with lasix  - no evidence of ILD on HRCT aug 2019 - dyspnea from weight and stiff heart muscle   Preop pulmonary/respiratory exam  - ok for knee surgery with low mderate risk for pulmonary complication - prevention of DVT and early mobilization key  Multiple lung nodules on CT - small nodules aug 2019  - repeat CT chest without contrast sep 2020   Throast tightness  - unclear cause  - ENT referrral  Encounter for immunization - Plan: Flu vaccine HIGH DOSE PF  Followup 3 months or sooner if needed

## 2018-05-07 ENCOUNTER — Ambulatory Visit: Payer: Medicare Other | Admitting: Internal Medicine

## 2018-05-11 DIAGNOSIS — K219 Gastro-esophageal reflux disease without esophagitis: Secondary | ICD-10-CM | POA: Diagnosis not present

## 2018-05-11 DIAGNOSIS — R1314 Dysphagia, pharyngoesophageal phase: Secondary | ICD-10-CM | POA: Insufficient documentation

## 2018-05-12 DIAGNOSIS — M47816 Spondylosis without myelopathy or radiculopathy, lumbar region: Secondary | ICD-10-CM | POA: Diagnosis not present

## 2018-05-12 DIAGNOSIS — Z6836 Body mass index (BMI) 36.0-36.9, adult: Secondary | ICD-10-CM | POA: Diagnosis not present

## 2018-05-17 NOTE — Progress Notes (Signed)
05-06-18 (Epic) Pulmonary clearance   04-05-18 (Epic) CT Chest  02-08-18 Surgical clearance on chart  01-18-18 (Epic) ECHO  11-23-17 (Epic) EKG

## 2018-05-17 NOTE — Patient Instructions (Addendum)
KASEY HANSELL  05/17/2018   Your procedure is scheduled on: 05-26-18     Report to [facility] Main  Entrance    Report to Admitting at 9:15 AM    Call this number if you have problems the morning of surgery [phone]     Remember: Do not eat food or drink liquids :After Midnight.    BRUSH YOUR TEETH MORNING OF SURGERY AND RINSE YOUR MOUTH OUT, NO CHEWING GUM CANDY OR MINTS.     Take these medicines the morning of surgery with A SIP OF WATER: Pantoprazole (Protonix), and Synthroid. You may also bring and use your inhaler as needed.                                You may not have any metal on your body including hair pins and              piercings  Do not wear jewelry, make-up, lotions, powders or perfumes, deodorant             Do not wear nail polish.  Do not shave  48 hours prior to surgery.              Do not bring valuables to the hospital. [place].  Contacts, dentures or bridgework may not be worn into surgery.  Leave suitcase in the car. After surgery it may be brought to your room.    :  Special Instructions: [name]              Please read over the following fact sheets you were given: _____________________________________________________________________  How to Manage Your Diabetes Before and After Surgery  Why is it important to control my blood sugar before and after surgery? . Improving blood sugar levels before and after surgery helps healing and can limit problems. . A way of improving blood sugar control is eating a healthy diet by: o  Eating less sugar and carbohydrates o  Increasing activity/exercise o  Talking with your doctor about reaching your blood sugar goals . High blood sugars (greater than 180 mg/dL) can raise your risk of infections and slow your recovery, so you will need to focus on controlling your diabetes during the weeks before surgery. . Make sure that the  doctor who takes care of your diabetes knows about your planned surgery including the date and location.  How do I manage my blood sugar before surgery? . Check your blood sugar at least 4 times a day, starting 2 days before surgery, to make sure that the level is not too high or low. o Check your blood sugar the morning of your surgery when you wake up and every 2 hours until you get to the Short Stay unit. . If your blood sugar is less than 70 mg/dL, you will need to treat for low blood sugar: o Do not take insulin. o Treat a low blood sugar (less than 70 mg/dL) with  cup of clear juice (cranberry or apple), 4 glucose tablets, OR glucose gel. o Recheck blood sugar in 15 minutes after treatment (to make sure it is greater than 70 mg/dL). If your blood sugar is not greater than 70 mg/dL on recheck, call [phone] for  further instructions. . Report your blood sugar to the short stay nurse when you get to Short Stay.  . If you are admitted to the hospital after surgery: o Your blood sugar will be checked by the staff and you will probably be given insulin after surgery (instead of oral diabetes medicines) to make sure you have good blood sugar levels. o The goal for blood sugar control after surgery is 80-180 mg/dL.   WHAT DO I DO ABOUT MY DIABETES MEDICATION?  Marland Kitchen Do not take oral diabetes medicines (pills) the morning of surgery.  . THE DAY BEFORE SURGERY, take your usual dose of [place] - Preparing for Surgery Before surgery, you can play an important role.  Because skin is not sterile, your skin needs to be as free of germs as possible.  You can reduce the number of germs on your skin by washing with CHG (chlorahexidine gluconate) soap before surgery.  CHG is an antiseptic cleaner which kills germs and bonds with the skin to continue killing germs even after washing. Please DO NOT use if you have an allergy to CHG or antibacterial soaps.  If your skin  becomes reddened/irritated stop using the CHG and inform your nurse when you arrive at Short Stay. Do not shave (including legs and underarms) for at least 48 hours prior to the first CHG shower.  You may shave your face/neck. Please follow these instructions carefully:  1.  Shower with CHG Soap the night before surgery and the  morning of Surgery.  2.  If you choose to wash your hair, wash your hair first as usual with your  normal  shampoo.  3.  After you shampoo, rinse your hair and body thoroughly to remove the  shampoo.                           4.  Use CHG as you would any other liquid soap.  You can apply chg directly  to the skin and wash                       Gently with a scrungie or clean washcloth.  5.  Apply the CHG Soap to your body ONLY FROM THE NECK DOWN.   Do not use on face/ open                           Wound or open sores. Avoid contact with eyes, ears mouth and genitals (private parts).                       Wash face,  Genitals (private parts) with your normal soap.             6.  Wash thoroughly, paying special attention to the area where your surgery  will be performed.  7.  Thoroughly rinse your body with warm water from the neck down.  8.  DO NOT shower/wash with your normal soap after using and rinsing off  the CHG Soap.                9.  Pat yourself dry with a clean towel.            10.  Wear clean pajamas.  11.  Place clean sheets on your bed the night of your first shower and do not  sleep with pets. Day of Surgery : Do not apply any lotions/deodorants the morning of surgery.  Please wear clean clothes to the hospital/surgery center.  FAILURE TO FOLLOW THESE INSTRUCTIONS MAY RESULT IN THE CANCELLATION OF YOUR SURGERY PATIENT SIGNATURE_________________________________  NURSE SIGNATURE__________________________________  ________________________________________________________________________   Adam Phenix  An incentive spirometer is a  tool that can help keep your lungs clear and active. This tool measures how well you are filling your lungs with each breath. Taking long deep breaths may help reverse or decrease the chance of developing breathing (pulmonary) problems (especially infection) following:  A long period of time when you are unable to move or be active. BEFORE THE PROCEDURE   If the spirometer includes an indicator to show your best effort, your nurse or respiratory therapist will set it to a desired goal.  If possible, sit up straight or lean slightly forward. Try not to slouch.  Hold the incentive spirometer in an upright position. INSTRUCTIONS FOR USE  1. Sit on the edge of your bed if possible, or sit up as far as you can in bed or on a chair. 2. Hold the incentive spirometer in an upright position. 3. Breathe out normally. 4. Place the mouthpiece in your mouth and seal your lips tightly around it. 5. Breathe in slowly and as deeply as possible, raising the piston or the ball toward the top of the column. 6. Hold your breath for 3-5 seconds or for as long as possible. Allow the piston or ball to fall to the bottom of the column. 7. Remove the mouthpiece from your mouth and breathe out normally. 8. Rest for a few seconds and repeat Steps 1 through 7 at least 10 times every 1-2 hours when you are awake. Take your time and take a few normal breaths between deep breaths. 9. The spirometer may include an indicator to show your best effort. Use the indicator as a goal to work toward during each repetition. 10. After each set of 10 deep breaths, practice coughing to be sure your lungs are clear. If you have an incision (the cut made at the time of surgery), support your incision when coughing by placing a pillow or rolled up towels firmly against it. Once you are able to get out of bed, walk around indoors and cough well. You may stop using the incentive spirometer when instructed by your caregiver.  RISKS AND  COMPLICATIONS  Take your time so you do not get dizzy or light-headed.  If you are in pain, you may need to take or ask for pain medication before doing incentive spirometry. It is harder to take a deep breath if you are having pain. AFTER USE  Rest and breathe slowly and easily.  It can be helpful to keep track of a log of your progress. Your caregiver can provide you with a simple table to help with this. If you are using the spirometer at home, follow these instructions: [place] IF:   You are having difficultly using the spirometer.  You have trouble using the spirometer as often as instructed.  Your pain medication is not giving enough relief while using the spirometer.  You develop fever of 100.5 F (38.1 C) or higher. SEEK IMMEDIATE MEDICAL CARE IF:   You cough up bloody sputum that had not been present before.  You develop fever of 102 F (38.9 C)  or greater.  You develop worsening pain at or near the incision site. MAKE SURE YOU:   Understand these instructions.  Will watch your condition.  Will get help right away if you are not doing well or get worse. Document Released: 12/29/2006 Document Revised: 11/10/2011 Document Reviewed: 03/01/2007 Exit[employer] Patient Information 2014 Exit[employer], [place].   ________________________________________________________________________  WHAT IS A BLOOD TRANSFUSION? Blood Transfusion Information  A transfusion is the replacement of blood or some of its parts. Blood is made up of multiple cells which provide different functions.  Red blood cells carry oxygen and are used for blood loss replacement.  White blood cells fight against infection.  Platelets control bleeding.  Plasma helps clot blood.  Other blood products are available for specialized needs, such as hemophilia or other clotting disorders. BEFORE THE TRANSFUSION  Who gives blood for transfusions?   Healthy volunteers who are fully evaluated to make sure  their blood is safe. This is blood bank blood. Transfusion therapy is the safest it has ever been in the practice of medicine. Before blood is taken from a donor, a complete history is taken to make sure that person has no history of diseases nor engages in risky social behavior (examples are intravenous drug use or sexual activity with multiple partners). The donor's travel history is screened to minimize risk of transmitting infections, such as malaria. The donated blood is tested for signs of infectious diseases, such as HIV and hepatitis. The blood is then tested to be sure it is compatible with you in order to minimize the chance of a transfusion reaction. If you or a relative donates blood, this is often done in anticipation of surgery and is not appropriate for emergency situations. It takes many days to process the donated blood. RISKS AND COMPLICATIONS Although transfusion therapy is very safe and saves many lives, the main dangers of transfusion include:   Getting an infectious disease.  Developing a transfusion reaction. This is an allergic reaction to something in the blood you were given. Every precaution is taken to prevent this. The decision to have a blood transfusion has been considered carefully by your caregiver before blood is given. Blood is not given unless the benefits outweigh the risks. AFTER THE TRANSFUSION  Right after receiving a blood transfusion, you will usually feel much better and more energetic. This is especially true if your red blood cells have gotten low (anemic). The transfusion raises the level of the red blood cells which carry oxygen, and this usually causes an energy increase.  The nurse administering the transfusion will monitor you carefully for complications. HOME CARE INSTRUCTIONS  No special instructions are needed after a transfusion. You may find your energy is better. Speak with your caregiver about any limitations on activity for underlying diseases  you may have. SEEK MEDICAL CARE IF:   Your condition is not improving after your transfusion.  You develop redness or irritation at the intravenous (IV) site. SEEK IMMEDIATE MEDICAL CARE IF:  Any of the following symptoms occur over the next 12 hours:  Shaking chills.  You have a temperature by mouth above 102 F (38.9 C), not controlled by medicine.  Chest, back, or muscle pain.  People around you feel you are not acting correctly or are confused.  Shortness of breath or difficulty breathing.  Dizziness and fainting.  You get a rash or develop hives.  You have a decrease in urine output.  Your urine turns a dark color or changes to pink, red,  or brown. Any of the following symptoms occur over the next 10 days:  You have a temperature by mouth above 102 F (38.9 C), not controlled by medicine.  Shortness of breath.  Weakness after normal activity.  The white part of the eye turns yellow (jaundice).  You have a decrease in the amount of urine or are urinating less often.  Your urine turns a dark color or changes to pink, red, or brown. Document Released: 08/15/2000 Document Revised: 11/10/2011 Document Reviewed: 04/03/2008 [facility] Patient Information 2014 [place], [place].  _______________________________________________________________________

## 2018-05-19 ENCOUNTER — Encounter (HOSPITAL_COMMUNITY)
Admission: RE | Admit: 2018-05-19 | Discharge: 2018-05-19 | Disposition: A | Discharge status: Home / Self Care | Payer: Medicare Other | Source: Ambulatory Visit | Attending: Orthopedic Surgery | Admitting: Orthopedic Surgery

## 2018-05-19 ENCOUNTER — Encounter (HOSPITAL_COMMUNITY): Payer: Self-pay

## 2018-05-19 ENCOUNTER — Other Ambulatory Visit: Payer: Self-pay

## 2018-05-19 DIAGNOSIS — Z8739 Personal history of other diseases of the musculoskeletal system and connective tissue: Secondary | ICD-10-CM | POA: Insufficient documentation

## 2018-05-19 DIAGNOSIS — I10 Essential (primary) hypertension: Secondary | ICD-10-CM | POA: Diagnosis not present

## 2018-05-19 DIAGNOSIS — Z7984 Long term (current) use of oral hypoglycemic drugs: Secondary | ICD-10-CM | POA: Diagnosis not present

## 2018-05-19 DIAGNOSIS — Z7982 Long term (current) use of aspirin: Secondary | ICD-10-CM | POA: Diagnosis not present

## 2018-05-19 DIAGNOSIS — Z01812 Encounter for preprocedural laboratory examination: Secondary | ICD-10-CM | POA: Diagnosis not present

## 2018-05-19 DIAGNOSIS — Z79890 Hormone replacement therapy (postmenopausal): Secondary | ICD-10-CM | POA: Insufficient documentation

## 2018-05-19 DIAGNOSIS — Z791 Long term (current) use of non-steroidal anti-inflammatories (NSAID): Secondary | ICD-10-CM | POA: Diagnosis not present

## 2018-05-19 DIAGNOSIS — Z833 Family history of diabetes mellitus: Secondary | ICD-10-CM | POA: Diagnosis not present

## 2018-05-19 DIAGNOSIS — Z8249 Family history of ischemic heart disease and other diseases of the circulatory system: Secondary | ICD-10-CM | POA: Diagnosis not present

## 2018-05-19 DIAGNOSIS — Z96652 Presence of left artificial knee joint: Secondary | ICD-10-CM | POA: Diagnosis not present

## 2018-05-19 DIAGNOSIS — K589 Irritable bowel syndrome without diarrhea: Secondary | ICD-10-CM | POA: Diagnosis not present

## 2018-05-19 DIAGNOSIS — E7800 Pure hypercholesterolemia, unspecified: Secondary | ICD-10-CM | POA: Diagnosis not present

## 2018-05-19 DIAGNOSIS — Z6836 Body mass index (BMI) 36.0-36.9, adult: Secondary | ICD-10-CM | POA: Insufficient documentation

## 2018-05-19 DIAGNOSIS — Z79899 Other long term (current) drug therapy: Secondary | ICD-10-CM | POA: Diagnosis not present

## 2018-05-19 DIAGNOSIS — E669 Obesity, unspecified: Secondary | ICD-10-CM | POA: Diagnosis not present

## 2018-05-19 DIAGNOSIS — E039 Hypothyroidism, unspecified: Secondary | ICD-10-CM | POA: Insufficient documentation

## 2018-05-19 LAB — COMPREHENSIVE METABOLIC PANEL
ALT: 16 U/L (ref 0–44)
AST: 20 U/L (ref 15–41)
Albumin: 4.4 g/dL (ref 3.5–5.0)
Alkaline Phosphatase: 77 U/L (ref 38–126)
Anion gap: 9 (ref 5–15)
BUN: 22 mg/dL (ref 8–23)
CO2: 28 mmol/L (ref 22–32)
Calcium: 10.3 mg/dL (ref 8.9–10.3)
Chloride: 105 mmol/L (ref 98–111)
Creatinine, Ser: 0.92 mg/dL (ref 0.44–1.00)
GFR calc Af Amer: >60 mL/min (ref >60)
GFR calc non Af Amer: >60 mL/min (ref >60)
Glucose, Bld: 141 mg/dL — ABNORMAL HIGH (ref 70–99)
Potassium: 5 mmol/L (ref 3.5–5.1)
Sodium: 142 mmol/L (ref 135–145)
Total Bilirubin: 0.5 mg/dL (ref 0.3–1.2)
Total Protein: 7.4 g/dL (ref 6.5–8.1)

## 2018-05-19 LAB — CBC
HCT: 38.5 % (ref 36.0–46.0)
Hemoglobin: 12.5 g/dL (ref 12.0–15.0)
MCH: 28.5 pg (ref 26.0–34.0)
MCHC: 32.5 g/dL (ref 30.0–36.0)
MCV: 87.7 fL (ref 78.0–100.0)
Platelets: 455 10*3/uL — ABNORMAL HIGH (ref 150–400)
RBC: 4.39 MIL/uL (ref 3.87–5.11)
RDW: 15.9 % — ABNORMAL HIGH (ref 11.5–15.5)
WBC: 9.5 10*3/uL (ref 4.0–10.5)

## 2018-05-19 LAB — PROTIME-INR
INR: 0.94
Prothrombin Time: 12.5 seconds (ref 11.4–15.2)

## 2018-05-19 LAB — SURGICAL PCR SCREEN
MRSA, PCR: NEGATIVE
Staphylococcus aureus: NEGATIVE

## 2018-05-19 LAB — APTT: aPTT: 31 seconds (ref 24–36)

## 2018-05-19 LAB — GLUCOSE, CAPILLARY: Glucose-Capillary: 118 mg/dL — ABNORMAL HIGH (ref 70–99)

## 2018-05-20 LAB — HEMOGLOBIN A1C
Hgb A1c MFr Bld: 6.2 % — ABNORMAL HIGH (ref 4.8–5.6)
Mean Plasma Glucose: 131 mg/dL

## 2018-05-25 MED ORDER — BUPIVACAINE LIPOSOME 1.3 % IJ SUSP
20.0000 mL | INTRAMUSCULAR | Status: DC
  Filled 2018-05-25: qty 20

## 2018-05-26 ENCOUNTER — Inpatient Hospital Stay (HOSPITAL_COMMUNITY): Payer: Medicare Other | Admitting: Anesthesiology

## 2018-05-26 ENCOUNTER — Encounter (HOSPITAL_COMMUNITY): Payer: Self-pay | Admitting: *Deleted

## 2018-05-26 ENCOUNTER — Other Ambulatory Visit: Payer: Self-pay

## 2018-05-26 ENCOUNTER — Encounter (HOSPITAL_COMMUNITY)
Admission: RE | Disposition: A | Discharge status: Home Health | Payer: Self-pay | Source: Ambulatory Visit | Attending: Orthopedic Surgery

## 2018-05-26 ENCOUNTER — Inpatient Hospital Stay (HOSPITAL_COMMUNITY)
Admission: RE | Admit: 2018-05-26 | Discharge: 2018-05-28 | DRG: 468 | Disposition: A | Discharge status: Home Health | Payer: Medicare Other | Source: Ambulatory Visit | Attending: Orthopedic Surgery | Admitting: Orthopedic Surgery

## 2018-05-26 DIAGNOSIS — J449 Chronic obstructive pulmonary disease, unspecified: Secondary | ICD-10-CM | POA: Diagnosis present

## 2018-05-26 DIAGNOSIS — Z4733 Aftercare following explantation of knee joint prosthesis: Secondary | ICD-10-CM | POA: Diagnosis not present

## 2018-05-26 DIAGNOSIS — M5030 Other cervical disc degeneration, unspecified cervical region: Secondary | ICD-10-CM | POA: Diagnosis present

## 2018-05-26 DIAGNOSIS — Z6837 Body mass index (BMI) 37.0-37.9, adult: Secondary | ICD-10-CM | POA: Diagnosis not present

## 2018-05-26 DIAGNOSIS — I10 Essential (primary) hypertension: Secondary | ICD-10-CM | POA: Diagnosis present

## 2018-05-26 DIAGNOSIS — G8918 Other acute postprocedural pain: Secondary | ICD-10-CM | POA: Diagnosis not present

## 2018-05-26 DIAGNOSIS — E119 Type 2 diabetes mellitus without complications: Secondary | ICD-10-CM | POA: Diagnosis present

## 2018-05-26 DIAGNOSIS — M1711 Unilateral primary osteoarthritis, right knee: Secondary | ICD-10-CM | POA: Diagnosis present

## 2018-05-26 DIAGNOSIS — T8453XA Infection and inflammatory reaction due to internal right knee prosthesis, initial encounter: Principal | ICD-10-CM | POA: Diagnosis present

## 2018-05-26 DIAGNOSIS — Z7984 Long term (current) use of oral hypoglycemic drugs: Secondary | ICD-10-CM | POA: Diagnosis not present

## 2018-05-26 DIAGNOSIS — K219 Gastro-esophageal reflux disease without esophagitis: Secondary | ICD-10-CM | POA: Diagnosis present

## 2018-05-26 DIAGNOSIS — E039 Hypothyroidism, unspecified: Secondary | ICD-10-CM | POA: Diagnosis present

## 2018-05-26 DIAGNOSIS — M6588 Other synovitis and tenosynovitis, other site: Secondary | ICD-10-CM | POA: Diagnosis not present

## 2018-05-26 DIAGNOSIS — E669 Obesity, unspecified: Secondary | ICD-10-CM | POA: Diagnosis present

## 2018-05-26 DIAGNOSIS — Z7982 Long term (current) use of aspirin: Secondary | ICD-10-CM | POA: Diagnosis not present

## 2018-05-26 DIAGNOSIS — Z79899 Other long term (current) drug therapy: Secondary | ICD-10-CM | POA: Diagnosis not present

## 2018-05-26 DIAGNOSIS — Z96652 Presence of left artificial knee joint: Secondary | ICD-10-CM | POA: Diagnosis present

## 2018-05-26 DIAGNOSIS — Z791 Long term (current) use of non-steroidal anti-inflammatories (NSAID): Secondary | ICD-10-CM

## 2018-05-26 DIAGNOSIS — M009 Pyogenic arthritis, unspecified: Secondary | ICD-10-CM

## 2018-05-26 DIAGNOSIS — Y831 Surgical operation with implant of artificial internal device as the cause of abnormal reaction of the patient, or of later complication, without mention of misadventure at the time of the procedure: Secondary | ICD-10-CM | POA: Diagnosis present

## 2018-05-26 DIAGNOSIS — T849XXA Unspecified complication of internal orthopedic prosthetic device, implant and graft, initial encounter: Secondary | ICD-10-CM | POA: Diagnosis not present

## 2018-05-26 HISTORY — DX: Pyogenic arthritis, unspecified: M00.9

## 2018-05-26 HISTORY — PX: REIMPLANTATION OF TOTAL KNEE: SHX6052

## 2018-05-26 LAB — TYPE AND SCREEN
ABO/RH(D): A POS
Antibody Screen: NEGATIVE

## 2018-05-26 LAB — GLUCOSE, CAPILLARY
Glucose-Capillary: 144 mg/dL — ABNORMAL HIGH (ref 70–99)
Glucose-Capillary: 130 mg/dL — ABNORMAL HIGH (ref 70–99)
Glucose-Capillary: 209 mg/dL — ABNORMAL HIGH (ref 70–99)

## 2018-05-26 LAB — GRAM STAIN: Gram Stain: NONE SEEN

## 2018-05-26 SURGERY — REVISION, TOTAL ARTHROPLASTY, KNEE
Anesthesia: Spinal | Site: Knee | Laterality: Right

## 2018-05-26 MED ORDER — SODIUM CHLORIDE 0.9 % IV SOLN
INTRAVENOUS | Status: DC | PRN
  Administered 2018-05-26: 20 ug/min via INTRAVENOUS

## 2018-05-26 MED ORDER — HYDROMORPHONE HCL 1 MG/ML IJ SOLN
INTRAMUSCULAR | Status: AC
  Administered 2018-05-26: 0.5 mg via INTRAVENOUS
  Filled 2018-05-26: qty 1

## 2018-05-26 MED ORDER — METOCLOPRAMIDE HCL 5 MG/ML IJ SOLN: 5.0000 mg | Freq: Three times a day (TID) | INTRAMUSCULAR | Status: DC | PRN

## 2018-05-26 MED ORDER — SODIUM CHLORIDE 0.9 % IJ SOLN
INTRAMUSCULAR | Status: DC | PRN
  Administered 2018-05-26: 30 mL

## 2018-05-26 MED ORDER — MEPERIDINE HCL 50 MG/ML IJ SOLN: 6.2500 mg | INTRAMUSCULAR | Status: DC | PRN

## 2018-05-26 MED ORDER — METHOCARBAMOL 500 MG PO TABS
500.0000 mg | ORAL_TABLET | Freq: Four times a day (QID) | ORAL | Status: DC | PRN
  Administered 2018-05-26 – 2018-05-28 (×5): 500 mg via ORAL
  Filled 2018-05-26 (×5): qty 1

## 2018-05-26 MED ORDER — MENTHOL 3 MG MT LOZG: 1.0000 | LOZENGE | OROMUCOSAL | Status: DC | PRN

## 2018-05-26 MED ORDER — ONDANSETRON HCL 4 MG/2ML IJ SOLN
INTRAMUSCULAR | Status: DC | PRN
  Administered 2018-05-26: 4 mg via INTRAVENOUS

## 2018-05-26 MED ORDER — ROPIVACAINE HCL 7.5 MG/ML IJ SOLN
INTRAMUSCULAR | Status: DC | PRN
  Administered 2018-05-26: 20 mL via PERINEURAL

## 2018-05-26 MED ORDER — PROPOFOL 10 MG/ML IV BOLUS
INTRAVENOUS | Status: AC
  Filled 2018-05-26: qty 40

## 2018-05-26 MED ORDER — ACETAMINOPHEN 500 MG PO TABS
1000.0000 mg | ORAL_TABLET | Freq: Four times a day (QID) | ORAL | Status: AC
  Administered 2018-05-26 – 2018-05-27 (×4): 1000 mg via ORAL
  Filled 2018-05-26 (×4): qty 2

## 2018-05-26 MED ORDER — BUPIVACAINE IN DEXTROSE 0.75-8.25 % IT SOLN
INTRATHECAL | Status: DC | PRN
  Administered 2018-05-26: 13.5 mg via INTRATHECAL

## 2018-05-26 MED ORDER — MIDAZOLAM HCL 2 MG/2ML IJ SOLN
1.0000 mg | INTRAMUSCULAR | Status: DC
  Administered 2018-05-26: 1 mg via INTRAVENOUS
  Filled 2018-05-26: qty 2

## 2018-05-26 MED ORDER — OXYCODONE HCL 5 MG PO TABS
10.0000 mg | ORAL_TABLET | ORAL | Status: DC | PRN
  Administered 2018-05-26 – 2018-05-28 (×8): 15 mg via ORAL
  Filled 2018-05-26 (×8): qty 3

## 2018-05-26 MED ORDER — ASPIRIN EC 325 MG PO TBEC
325.0000 mg | DELAYED_RELEASE_TABLET | Freq: Two times a day (BID) | ORAL | Status: DC
  Administered 2018-05-27 – 2018-05-28 (×3): 325 mg via ORAL
  Filled 2018-05-26 (×3): qty 1

## 2018-05-26 MED ORDER — MIDAZOLAM HCL 5 MG/5ML IJ SOLN
INTRAMUSCULAR | Status: DC | PRN
  Administered 2018-05-26 (×2): 1 mg via INTRAVENOUS

## 2018-05-26 MED ORDER — DEXAMETHASONE SODIUM PHOSPHATE 10 MG/ML IJ SOLN
INTRAMUSCULAR | Status: DC | PRN
  Administered 2018-05-26: 10 mg via INTRAVENOUS

## 2018-05-26 MED ORDER — MIDAZOLAM HCL 2 MG/2ML IJ SOLN
INTRAMUSCULAR | Status: AC
  Filled 2018-05-26: qty 2

## 2018-05-26 MED ORDER — LINAGLIPTIN 5 MG PO TABS
5.0000 mg | ORAL_TABLET | Freq: Every day | ORAL | Status: DC
  Administered 2018-05-27 – 2018-05-28 (×2): 5 mg via ORAL
  Filled 2018-05-26 (×2): qty 1

## 2018-05-26 MED ORDER — PROMETHAZINE HCL 25 MG/ML IJ SOLN: 6.2500 mg | INTRAMUSCULAR | Status: DC | PRN

## 2018-05-26 MED ORDER — PHENYLEPHRINE 40 MCG/ML (10ML) SYRINGE FOR IV PUSH (FOR BLOOD PRESSURE SUPPORT)
PREFILLED_SYRINGE | INTRAVENOUS | Status: DC | PRN
  Administered 2018-05-26 (×5): 80 ug via INTRAVENOUS

## 2018-05-26 MED ORDER — ONDANSETRON HCL 4 MG/2ML IJ SOLN: 4.0000 mg | Freq: Four times a day (QID) | INTRAMUSCULAR | Status: DC | PRN

## 2018-05-26 MED ORDER — METHOCARBAMOL 500 MG IVPB - SIMPLE MED
INTRAVENOUS | Status: AC
  Administered 2018-05-26: 500 mg via INTRAVENOUS
  Filled 2018-05-26: qty 50

## 2018-05-26 MED ORDER — PHENOL 1.4 % MT LIQD: 1.0000 | OROMUCOSAL | Status: DC | PRN

## 2018-05-26 MED ORDER — OXYCODONE HCL 5 MG PO TABS
ORAL_TABLET | ORAL | Status: AC
  Filled 2018-05-26: qty 1

## 2018-05-26 MED ORDER — CEFAZOLIN SODIUM-DEXTROSE 2-4 GM/100ML-% IV SOLN
2.0000 g | INTRAVENOUS | Status: AC
  Administered 2018-05-26: 2 g via INTRAVENOUS
  Filled 2018-05-26: qty 100

## 2018-05-26 MED ORDER — PHENYLEPHRINE HCL 10 MG/ML IJ SOLN
INTRAMUSCULAR | Status: AC
  Filled 2018-05-26: qty 1

## 2018-05-26 MED ORDER — OXYCODONE HCL 5 MG PO TABS
5.0000 mg | ORAL_TABLET | ORAL | Status: DC | PRN
  Administered 2018-05-26 (×2): 5 mg via ORAL
  Filled 2018-05-26: qty 1

## 2018-05-26 MED ORDER — PROPOFOL 10 MG/ML IV BOLUS
INTRAVENOUS | Status: AC
  Filled 2018-05-26: qty 20

## 2018-05-26 MED ORDER — TRANEXAMIC ACID 1000 MG/10ML IV SOLN
1000.0000 mg | INTRAVENOUS | Status: AC
  Administered 2018-05-26: 1000 mg via INTRAVENOUS
  Filled 2018-05-26: qty 10

## 2018-05-26 MED ORDER — BISACODYL 10 MG RE SUPP: 10.0000 mg | Freq: Every day | RECTAL | Status: DC | PRN

## 2018-05-26 MED ORDER — DEXAMETHASONE SODIUM PHOSPHATE 10 MG/ML IJ SOLN: 8.0000 mg | Freq: Once | INTRAMUSCULAR | Status: DC

## 2018-05-26 MED ORDER — LEVOTHYROXINE SODIUM 125 MCG PO TABS
125.0000 ug | ORAL_TABLET | Freq: Every day | ORAL | Status: DC
  Administered 2018-05-27 – 2018-05-28 (×2): 125 ug via ORAL
  Filled 2018-05-26 (×2): qty 1

## 2018-05-26 MED ORDER — ONDANSETRON HCL 4 MG PO TABS: 4.0000 mg | ORAL_TABLET | Freq: Four times a day (QID) | ORAL | Status: DC | PRN

## 2018-05-26 MED ORDER — SODIUM CHLORIDE 0.9 % IR SOLN
Status: DC | PRN
  Administered 2018-05-26: 1000 mL

## 2018-05-26 MED ORDER — DEXAMETHASONE SODIUM PHOSPHATE 10 MG/ML IJ SOLN
10.0000 mg | Freq: Once | INTRAMUSCULAR | Status: AC
  Administered 2018-05-27: 10 mg via INTRAVENOUS
  Filled 2018-05-26: qty 1

## 2018-05-26 MED ORDER — METOCLOPRAMIDE HCL 5 MG PO TABS: 5.0000 mg | ORAL_TABLET | Freq: Three times a day (TID) | ORAL | Status: DC | PRN

## 2018-05-26 MED ORDER — HYDROMORPHONE HCL 1 MG/ML IJ SOLN
0.2500 mg | INTRAMUSCULAR | Status: DC | PRN
  Administered 2018-05-26 (×3): 0.5 mg via INTRAVENOUS

## 2018-05-26 MED ORDER — TRANEXAMIC ACID 1000 MG/10ML IV SOLN
1000.0000 mg | Freq: Once | INTRAVENOUS | Status: AC
  Administered 2018-05-26: 1000 mg via INTRAVENOUS
  Filled 2018-05-26: qty 1000

## 2018-05-26 MED ORDER — METHOCARBAMOL 500 MG IVPB - SIMPLE MED
500.0000 mg | Freq: Four times a day (QID) | INTRAVENOUS | Status: DC | PRN
  Administered 2018-05-26: 500 mg via INTRAVENOUS
  Filled 2018-05-26: qty 50

## 2018-05-26 MED ORDER — SODIUM CHLORIDE 0.9 % IJ SOLN
INTRAMUSCULAR | Status: AC
  Filled 2018-05-26: qty 50

## 2018-05-26 MED ORDER — LACTATED RINGERS IV SOLN
INTRAVENOUS | Status: DC
  Administered 2018-05-26 (×2): via INTRAVENOUS

## 2018-05-26 MED ORDER — FLEET ENEMA 7-19 GM/118ML RE ENEM: 1.0000 | ENEMA | Freq: Once | RECTAL | Status: DC | PRN

## 2018-05-26 MED ORDER — PANTOPRAZOLE SODIUM 40 MG PO TBEC
40.0000 mg | DELAYED_RELEASE_TABLET | Freq: Every day | ORAL | Status: DC
  Administered 2018-05-27 – 2018-05-28 (×2): 40 mg via ORAL
  Filled 2018-05-26 (×2): qty 1

## 2018-05-26 MED ORDER — PHENYLEPHRINE 40 MCG/ML (10ML) SYRINGE FOR IV PUSH (FOR BLOOD PRESSURE SUPPORT)
PREFILLED_SYRINGE | INTRAVENOUS | Status: AC
  Filled 2018-05-26: qty 10

## 2018-05-26 MED ORDER — FENTANYL CITRATE (PF) 100 MCG/2ML IJ SOLN
50.0000 ug | INTRAMUSCULAR | Status: DC
  Administered 2018-05-26: 50 ug via INTRAVENOUS
  Filled 2018-05-26: qty 2

## 2018-05-26 MED ORDER — ACETAMINOPHEN 10 MG/ML IV SOLN
1000.0000 mg | Freq: Four times a day (QID) | INTRAVENOUS | Status: DC
  Administered 2018-05-26: 1000 mg via INTRAVENOUS
  Filled 2018-05-26: qty 100

## 2018-05-26 MED ORDER — MIDAZOLAM HCL 2 MG/2ML IJ SOLN: 0.5000 mg | Freq: Once | INTRAMUSCULAR | Status: DC | PRN

## 2018-05-26 MED ORDER — BUPIVACAINE LIPOSOME 1.3 % IJ SUSP
INTRAMUSCULAR | Status: DC | PRN
  Administered 2018-05-26: 20 mL

## 2018-05-26 MED ORDER — CEFAZOLIN SODIUM-DEXTROSE 2-4 GM/100ML-% IV SOLN
2.0000 g | Freq: Four times a day (QID) | INTRAVENOUS | Status: AC
  Administered 2018-05-26 (×2): 2 g via INTRAVENOUS
  Filled 2018-05-26 (×2): qty 100

## 2018-05-26 MED ORDER — CHLORHEXIDINE GLUCONATE 4 % EX LIQD: 60.0000 mL | Freq: Once | CUTANEOUS | Status: DC

## 2018-05-26 MED ORDER — INSULIN ASPART 100 UNIT/ML ~~LOC~~ SOLN
0.0000 [IU] | Freq: Three times a day (TID) | SUBCUTANEOUS | Status: DC
  Administered 2018-05-27: 2 [IU] via SUBCUTANEOUS
  Administered 2018-05-27 (×2): 3 [IU] via SUBCUTANEOUS

## 2018-05-26 MED ORDER — STERILE WATER FOR IRRIGATION IR SOLN
Status: DC | PRN
  Administered 2018-05-26: 2000 mL

## 2018-05-26 MED ORDER — HYDROMORPHONE HCL 1 MG/ML IJ SOLN
0.5000 mg | INTRAMUSCULAR | Status: DC | PRN
  Administered 2018-05-26 – 2018-05-27 (×3): 1 mg via INTRAVENOUS
  Filled 2018-05-26 (×3): qty 1

## 2018-05-26 MED ORDER — DIPHENHYDRAMINE HCL 12.5 MG/5ML PO ELIX: 12.5000 mg | ORAL_SOLUTION | ORAL | Status: DC | PRN

## 2018-05-26 MED ORDER — SODIUM CHLORIDE 0.9 % IV SOLN
INTRAVENOUS | Status: DC
  Administered 2018-05-26: 18:00:00 via INTRAVENOUS

## 2018-05-26 MED ORDER — GABAPENTIN 300 MG PO CAPS
300.0000 mg | ORAL_CAPSULE | Freq: Once | ORAL | Status: AC
  Administered 2018-05-26: 300 mg via ORAL
  Filled 2018-05-26: qty 1

## 2018-05-26 MED ORDER — PROPOFOL 500 MG/50ML IV EMUL
INTRAVENOUS | Status: DC | PRN
  Administered 2018-05-26: 100 ug/kg/min via INTRAVENOUS

## 2018-05-26 MED ORDER — TRAMADOL HCL 50 MG PO TABS
50.0000 mg | ORAL_TABLET | Freq: Four times a day (QID) | ORAL | Status: DC | PRN
  Administered 2018-05-26 – 2018-05-28 (×4): 100 mg via ORAL
  Filled 2018-05-26 (×4): qty 2

## 2018-05-26 MED ORDER — FUROSEMIDE 20 MG PO TABS
20.0000 mg | ORAL_TABLET | Freq: Every day | ORAL | Status: DC
  Administered 2018-05-27: 20 mg via ORAL
  Filled 2018-05-26 (×2): qty 1

## 2018-05-26 MED ORDER — DOCUSATE SODIUM 100 MG PO CAPS
100.0000 mg | ORAL_CAPSULE | Freq: Two times a day (BID) | ORAL | Status: DC
  Administered 2018-05-26 – 2018-05-28 (×4): 100 mg via ORAL
  Filled 2018-05-26 (×4): qty 1

## 2018-05-26 MED ORDER — POLYETHYLENE GLYCOL 3350 17 G PO PACK
17.0000 g | PACK | Freq: Every day | ORAL | Status: DC | PRN
  Administered 2018-05-27: 17 g via ORAL
  Filled 2018-05-26: qty 1

## 2018-05-26 SURGICAL SUPPLY — 68 items
ADAPTER BOLT FEMORAL +2/-2 (Knees) ×1 IMPLANT
ADPR FEM +2/-2 OFST BOLT (Knees) ×1 IMPLANT
ADPR FEM 5D STRL KN PFC SGM (Orthopedic Implant) ×1 IMPLANT
AUG FEM SZ3 4 CMB POST STRL LF (Knees) ×2 IMPLANT
AUG TIB SZ3 5 REV STP WDG STRL (Knees) ×2 IMPLANT
BAG SPEC THK2 15X12 ZIP CLS (MISCELLANEOUS) ×1
BAG ZIPLOCK 12X15 (MISCELLANEOUS) ×2 IMPLANT
BANDAGE ACE 6X5 VEL STRL LF (GAUZE/BANDAGES/DRESSINGS) ×2 IMPLANT
BLADE SAG 18X100X1.27 (BLADE) ×2 IMPLANT
BLADE SAW SGTL 11.0X1.19X90.0M (BLADE) ×2 IMPLANT
BONE CEMENT GENTAMICIN (Cement) ×6 IMPLANT
CEMENT BONE GENTAMICIN 40 (Cement) ×3 IMPLANT
CEMENT RESTRICTOR DEPUY SZ 4 (Cement) ×3 IMPLANT
CLSR STERI-STRIP ANTIMIC 1/2X4 (GAUZE/BANDAGES/DRESSINGS) ×1 IMPLANT
COMP FEM CEM RT SZ3 (Orthopedic Implant) ×2 IMPLANT
COMPONENT FEM CEM RT SZ3 (Orthopedic Implant) IMPLANT
COVER SURGICAL LIGHT HANDLE (MISCELLANEOUS) ×2 IMPLANT
CUFF TOURN SGL QUICK 34 (TOURNIQUET CUFF) ×2
CUFF TRNQT CYL 34X4X40X1 (TOURNIQUET CUFF) ×1 IMPLANT
DRAPE U-SHAPE 47X51 STRL (DRAPES) ×2 IMPLANT
DRSG ADAPTIC 3X8 NADH LF (GAUZE/BANDAGES/DRESSINGS) ×2 IMPLANT
DRSG PAD ABDOMINAL 8X10 ST (GAUZE/BANDAGES/DRESSINGS) ×2 IMPLANT
DURAPREP 26ML APPLICATOR (WOUND CARE) ×2 IMPLANT
ELECT REM PT RETURN 15FT ADLT (MISCELLANEOUS) ×2 IMPLANT
EVACUATOR 1/8 PVC DRAIN (DRAIN) ×2 IMPLANT
FEMORAL ADAPTER (Orthopedic Implant) ×1 IMPLANT
GAUZE SPONGE 4X4 12PLY STRL (GAUZE/BANDAGES/DRESSINGS) ×2 IMPLANT
GLOVE BIO SURGEON STRL SZ7.5 (GLOVE) ×1 IMPLANT
GLOVE BIO SURGEON STRL SZ8 (GLOVE) ×2 IMPLANT
GLOVE BIOGEL PI IND STRL 7.0 (GLOVE) ×1 IMPLANT
GLOVE BIOGEL PI IND STRL 8 (GLOVE) ×3 IMPLANT
GLOVE BIOGEL PI INDICATOR 7.0 (GLOVE) ×1
GLOVE BIOGEL PI INDICATOR 8 (GLOVE) ×3
GLOVE SURG SS PI 7.0 STRL IVOR (GLOVE) ×2 IMPLANT
GOWN STRL REUS W/TWL LRG LVL3 (GOWN DISPOSABLE) ×6 IMPLANT
GOWN STRL REUS W/TWL XL LVL3 (GOWN DISPOSABLE) ×2 IMPLANT
HANDPIECE INTERPULSE COAX TIP (DISPOSABLE) ×2
IMMOBILIZER KNEE 20 (SOFTGOODS) ×3 IMPLANT
IMMOBILIZER KNEE 20 THIGH 36 (SOFTGOODS) ×1 IMPLANT
INSERT TC3 RP TIBIAL SZ 3.0 (Knees) ×1 IMPLANT
MANIFOLD NEPTUNE II (INSTRUMENTS) ×2 IMPLANT
NS IRRIG 1000ML POUR BTL (IV SOLUTION) ×1 IMPLANT
PAD ABD 8X10 STRL (GAUZE/BANDAGES/DRESSINGS) ×1 IMPLANT
PADDING CAST COTTON 6X4 STRL (CAST SUPPLIES) ×4 IMPLANT
PATELLA DOME PFC 38MM (Knees) ×1 IMPLANT
PIN STEINMAN FIXATION KNEE (PIN) ×1 IMPLANT
POSITIONER SURGICAL ARM (MISCELLANEOUS) ×2 IMPLANT
POST AVE PFC 4MM (Knees) ×2 IMPLANT
SET HNDPC FAN SPRY TIP SCT (DISPOSABLE) ×1 IMPLANT
STEM TIBIA PFC 13X30MM (Stem) ×1 IMPLANT
STEM UNIVERSAL REVISION 75X18 (Stem) ×1 IMPLANT
SUT MNCRL AB 4-0 PS2 18 (SUTURE) ×1 IMPLANT
SUT PDS AB 1 CT1 27 (SUTURE) ×6 IMPLANT
SUT STRATAFIX 0 PDS 27 VIOLET (SUTURE) ×2
SUT VIC AB 2-0 CT1 27 (SUTURE) ×6
SUT VIC AB 2-0 CT1 TAPERPNT 27 (SUTURE) ×3 IMPLANT
SUTURE STRATFX 0 PDS 27 VIOLET (SUTURE) ×1 IMPLANT
SWAB COLLECTION DEVICE MRSA (MISCELLANEOUS) ×2 IMPLANT
SWAB CULTURE ESWAB REG 1ML (MISCELLANEOUS) ×2 IMPLANT
SYR 50ML LL SCALE MARK (SYRINGE) ×1 IMPLANT
TOWER CARTRIDGE SMART MIX (DISPOSABLE) ×2 IMPLANT
TRAY FOLEY MTR SLVR 16FR STAT (SET/KITS/TRAYS/PACK) ×2 IMPLANT
TRAY REVISION SZ 3 (Knees) ×1 IMPLANT
TRAY SLEEVE CEM ML (Knees) ×1 IMPLANT
WATER STERILE IRR 1000ML POUR (IV SOLUTION) ×1 IMPLANT
WEDGE SIZE 3 5MM (Knees) ×2 IMPLANT
WRAP KNEE MAXI GEL POST OP (GAUZE/BANDAGES/DRESSINGS) ×3 IMPLANT
YANKAUER SUCT BULB TIP 10FT TU (MISCELLANEOUS) ×1 IMPLANT

## 2018-05-26 NOTE — Transfer of Care (Signed)
Immediate Anesthesia Transfer of Care Note  Patient: Sheri [name]  Procedure(s) Performed: Right total knee arthroplasty reimplantation (Right Knee)  Patient Location: PACU  Anesthesia Type:Regional and Spinal  Level of Consciousness: sedated  Airway & Oxygen Therapy: Patient Spontanous Breathing and Patient connected to face mask oxygen  Post-op Assessment: Report given to RN and Post -op Vital signs reviewed and stable  Post vital signs: Reviewed and stable  Last Vitals:  Vitals Value Taken Time  BP 136/79 05/26/2018  1:41 PM  Temp    Pulse 69 05/26/2018  1:42 PM  Resp 17 05/26/2018  1:42 PM  SpO2 100 % 05/26/2018  1:42 PM  Vitals shown include unvalidated device data.  Last Pain:  Vitals:   05/26/18 0940  TempSrc: Oral         Complications: No apparent anesthesia complications

## 2018-05-26 NOTE — Anesthesia Procedure Notes (Signed)
Anesthesia Regional Block: Adductor canal block   Pre-Anesthetic Checklist: ,, timeout performed, Correct Patient, Correct Site, Correct Laterality, Correct Procedure, Correct Position, site marked, Risks and benefits discussed,  Surgical consent,  Pre-op evaluation,  At surgeon's request and post-op pain management  Laterality: Right and Lower  Prep: chloraprep       Needles:  Injection technique: Single-shot  Needle Type: Echogenic Needle     Needle Length: 9cm  Needle Gauge: 21     Additional Needles:   Procedures:,,,, ultrasound used (permanent image in chart),,,,  Narrative:  Start time: 05/26/2018 10:07 AM End time: 05/26/2018 10:14 AM Injection made incrementally with aspirations every 5 mL. Anesthesiologist: Annye Asa, MD  Additional Notes: Pt identified in Holding room.  Monitors applied. Working IV access confirmed. Sterile prep R thigh.  #21ga ECHOgenic needle into adductor canal with US guidance.  20cc 0.75% Ropivacaine injected incrementally after negative test dose.  Patient asymptomatic, VSS, no heme aspirated, tolerated well.  Jenita Seashore, MD

## 2018-05-26 NOTE — Progress Notes (Signed)
AssistedDr. Carswell Jackson with right, ultrasound guided, adductor canal block. Side rails up, monitors on throughout procedure. See vital signs in flow sheet. Tolerated Procedure well.  

## 2018-05-26 NOTE — Interval H&P Note (Signed)
History and Physical Interval Note:  05/26/2018 9:43 AM  Sheri [name]  has presented today for surgery, with the diagnosis of Right knee resection arthroplasty  The various methods of treatment have been discussed with the patient and family. After consideration of risks, benefits and other options for treatment, the patient has consented to  Procedure(s): Right total knee arthroplasty reimplantation (Right) as a surgical intervention .  The patient's history has been reviewed, patient examined, no change in status, stable for surgery.  I have reviewed the patient's chart and labs.  Questions were answered to the patient's satisfaction.     Sheri [name]

## 2018-05-26 NOTE — Brief Op Note (Signed)
05/26/2018  1:11 PM  PATIENT:  Sheri [name]  67 y.o. female  PRE-OPERATIVE DIAGNOSIS:  Right knee resection arthoplasty  POST-OPERATIVE DIAGNOSIS:  Right knee resection arthoplasty  PROCEDURE:  Procedure(s): Right total knee arthroplasty reimplantation (Right)  SURGEON:  Surgeon(s) and Role:    Gaynelle Arabian, MD - Primary  PHYSICIAN ASSISTANT:   ASSISTANTS: Theresa Duty, PA-C   ANESTHESIA:   Adductor canal block and spinal  EBL:  20 mL   BLOOD ADMINISTERED:none  DRAINS: (Medium) Hemovact drain(s) in the right knee with  Suction Open   LOCAL MEDICATIONS USED:  OTHER Exparel  COUNTS:  YES  TOURNIQUET:   Total Tourniquet Time Documented: Thigh (Right) - 75 minutes Total: Thigh (Right) - 75 minutes   DICTATION: .Other Dictation: Dictation Number [id number]  PLAN OF CARE: Admit to inpatient   PATIENT DISPOSITION:  PACU - hemodynamically stable.

## 2018-05-26 NOTE — Anesthesia Preprocedure Evaluation (Addendum)
Anesthesia Evaluation  Patient identified by MRN, date of birth, ID band Patient awake    Reviewed: Allergy & Precautions, NPO status , Patient's Chart, lab work & pertinent test results  History of Anesthesia Complications (+) PONV  Airway Mallampati: I  TM Distance: >3 FB Neck ROM: Full    Dental  (+) Dental Advisory Given, Caps   Pulmonary COPD (does not use inhalers),    breath sounds clear to auscultation       Cardiovascular hypertension, Pt. on medications  Rhythm:Regular Rate:Normal  5/19 ECHO: EF 50-55%, valves ok   Neuro/Psych negative neurological ROS     GI/Hepatic Neg liver ROS, GERD  Medicated and Controlled,  Endo/Other  diabetes (glu 144), Oral Hypoglycemic AgentsHypothyroidism   Renal/GU negative Renal ROS     Musculoskeletal  (+) Arthritis , Osteoarthritis,    Abdominal (+) + obese,   Peds  Hematology negative hematology ROS (+)   Anesthesia Other Findings   Reproductive/Obstetrics                            Anesthesia Physical Anesthesia Plan  ASA: III  Anesthesia Plan: Spinal   Post-op Pain Management:  Regional for Post-op pain   Induction:   PONV Risk Score and Plan: 3 and Ondansetron, Dexamethasone and Treatment may vary due to age or medical condition  Airway Management Planned: Natural Airway and Nasal Cannula  Additional Equipment:   Intra-op Plan:   Post-operative Plan:   Informed Consent: I have reviewed the patients History and Physical, chart, labs and discussed the procedure including the risks, benefits and alternatives for the proposed anesthesia with the patient or authorized representative who has indicated his/her understanding and acceptance.   Dental advisory given  Plan Discussed with: CRNA and Surgeon  Anesthesia Plan Comments: (Plan routine monitors, SAB with adductor canal block for post op analgesia)        Anesthesia  Quick Evaluation

## 2018-05-26 NOTE — Op Note (Signed)
NAME: Sheri [name], Sheri [name] MEDICAL RECORD XN:[id number] ACCOUNT [id number] DATE OF BIRTH:1950/09/04 FACILITY: [facility] LOCATION: [id number] PHYSICIAN:Artrice Kraker Zella Ball, MD  OPERATIVE REPORT  DATE OF PROCEDURE:  05/26/2018  PREOPERATIVE DIAGNOSIS:  Right knee resection arthroplasty.  POSTOPERATIVE DIAGNOSIS:  Right knee resection arthroplasty.  PROCEDURE:  Right total knee arthroplasty reimplantation.  SURGEON:  Dione Plover. Claude Swendsen, MD  ASSISTANT:  Theresa Duty, PA-C  ANESTHESIA:  Adductor canal block and spinal.  ESTIMATED BLOOD LOSS:  20 mL.  DRAINS:  Hemovac x1.  TOURNIQUET TIME:  75 minutes at 300 mmHg.  COMPLICATIONS:  None.  CONDITION:  Stable to recovery.  BRIEF CLINICAL NOTE:  The patient is a 67 year old female who had a resection arthroplasty of her right knee approximately 3 months ago secondary to periprosthetic joint infection.  She has had IV antibiotics and oral antibiotics.  Lab work has come back  normal.  She presents now for total knee arthroplasty reimplantation.  PROCEDURE IN DETAIL:  After successful administration of adductor canal block, then spinal anesthetic, a tourniquet was placed high on her right thigh and right lower extremity prepped and draped in the usual sterile fashion.  Extremity was wrapped in  Esmarch and tourniquet inflated to 300 mmHg.  Midline incision made with a 10 blade through subcutaneous tissue to the level of the extensor mechanism.  A fresh blade was used to make a medial parapatellar arthrotomy.  There was minimal fluid in the  joint.  It was sent for stat Gram stain, which came back rare white blood cells, no organisms.  We also took a section of synovium was sent for frozen section, no acute inflammation was noted.  I removed the scar tissue from the joint and then soft  tissue at the proximal medial tibia.  I did subperiosteally elevate the joint line with a knife and into the semimembranosus bursa with a Cobb elevator.  Soft tissue  laterally was elevated with attention being paid to avoid the patellar tendon on the  tibial tubercle.  Patella was everted, knee flexed 90 degrees.  I was able to sublux the tibia forward to remove the tibial insert.  She had an articulating spacer and there was a tibial insert, cemented into the tibia.  This was removed and then I  carefully removed all the cement from the tibia.  We then reamed the canal up to 13 mm for a 13 mm stem.  The extramedullary tibial alignment guide was placed referencing proximally at the medial aspect of tibial tubercle and distally along the second  metatarsal axis and tibial crest.  Block was pinned to remove about 2 mm off the previously cut bone surface.  Resection was made with an oscillating saw.  Size 3 was the most appropriate tibial component.  Proximal tibia was prepared a modular drill  then modular drill +13 by 30 stem extension for the size 3.  I then prepared proximally with a 29 mm broach for placement of a cemented 29 mm sleeve.  Femur was then addressed.  The osteotome was used to disrupt the interface between the femoral component and bone and the component was removed without any bone loss.  I then started irrigating the femoral canal and reamed the femoral canal up to 18 mm,  which had excellent fit and the stem was left in place to serve as our intramedullary alignment rod.  The distal femoral cutting block is placed and resected about 2 mm off each side.  This was in 5 degrees of valgus.  A size 3 was the most appropriate  femoral component and a size 3 cutting block is placed with the rotation corresponding with the patient's epicondylar axis was also confirmed by creating a symmetric flexion gap at 90 degrees of flexion with the spacer intact.  The block was pinned in  that position.  We got essentially no bone anterior to posterior with minimal bone and chamfers.  I wanted to place 4 mm posterior augments to try and effectively translate the construct  posteriorly so that the anterior flange of the femoral component  would rest on the anterior cortex of the femur.  The trials were then put together on the tibial side.  It is a size 3 MBT revision tray with a 29 sleeve, 5 mm medial and lateral augments and 13 x 30 stem extension.  It is impacted into the tibial canal  with good fit.  On the femoral side, there is a size 3 TC3 femur with  4 mm posterior augments medial and lateral, an 18 x 75 stem extension.  We did not need the sleeve was reading distal augments.  Trials were placed and I got up to a 17.5 mm spacer.  With the 17.5 full extension was achieved with excellent varus valgus  and AP balance throughout full range of motion.  I everted the patella again and patellar thickness was about 12 mm.  I resected about 10 mm, but there was significant amount of bone loss laterally.  I drilled the lug holes for a 38 patella, but only 2  lug holes were in decent bone and the third one was not even in bone.  I did place a trial patella, but it did not hold well.  I was going to try cemented patellar component just to see if would be stable.  The components were assembled on the back table.  The trials were removed and the bone surfaces prepared with pulsatile lavage.  Three batches of gentamicin impregnated cement are mixed and once ready for implantation, the cement was injected into the  tibial canal and on the cut bone surface.  Tibial components cemented into place.  It is impacted and all extruded cement was removed.  On the femoral side, we press fit the stem and then just cemented distally.  The components were cemented into place,  impacted and all extruded cement removed.  Trial 17.5 mm insert was placed.  Knee held in full extension and the rest of the cement was removed.  I did attempt to cement in the 38 mm patella.  I held it with a clamp.  When the cement was fully hardened,  the knee permanent 17.5 mm TC3 rotating platform insert was placed in  the tibial tray.  The knee was reduced with excellent stability.  When I removed the patellar clamp.  I felt that the patellar component was not stable and in fact was easily removed.   I removed all cement and we essentially treated this is a patelloplasty.  As I resected off all the potentially impinging edges of it.  There was enough soft tissue in the area to create a buffer over the cut bone.  The wound was then copiously  irrigated with saline solution and 20 mL of Exparel mixed with 60 mL of saline injected into the extensor mechanism, subcutaneous tissues and periosteum of the femur.  The arthrotomy was closed over a Hemovac drain with a running 0 Stratafix suture.   Flexion against gravity was 130  degrees and the patella tracks normally.  The tourniquet was released for a total time of 75 minutes.  Minor bleeding stopped with cautery.  A second drain was placed in the subcutaneous tissue and the subcu closed with  interrupted 2-0 Vicryl subcuticular running 4-0 Monocryl.  The incision was cleaned and dried and Steri-Strips and a bulky sterile dressing applied.    She was then placed into a knee immobilizer, awakened and transported to recovery in stable condition.  AN/NUANCE  D:05/26/2018 T:05/26/2018 JOB:[id number]

## 2018-05-26 NOTE — Anesthesia Procedure Notes (Signed)
Spinal  Patient location during procedure: OR End time: 05/26/2018 11:24 AM Staffing Anesthesiologist: Annye Asa, MD Performed: anesthesiologist  Preanesthetic Checklist Completed: patient identified, site marked, surgical consent, pre-op evaluation, timeout performed, IV checked, risks and benefits discussed and monitors and equipment checked Spinal Block Patient position: sitting Prep: site prepped and draped and DuraPrep Patient monitoring: blood pressure, continuous pulse ox, cardiac monitor and heart rate Approach: midline Location: L3-4 Injection technique: single-shot Needle Needle type: Quincke  Needle gauge: 22 G Needle length: 9 cm Assessment Events: paresthesia and R leg paresthesia Additional Notes Pt identified in Operating room.  Monitors applied. Working IV access confirmed. Sterile prep, drape lumbar spine.  1% lido local L 3,4. Unable to enter CSF with #24ga Pencan at L 3,4 or 2,3.  #25ga Quincke into clear CSF L 3,4 (transient R leg paresthesia) but unable to aspirate CSF despite CSF flow.  Repeat L3,4 #22ga Quincke and again CSF flow, but unable to aspirate. 13.5 mg 0.75% Bupivacaine with dextrose injected, unable asp CSF beginning or end of injection.  Patient asymptomatic, VSS, no heme aspirated, tolerated well.  Jenita Seashore, MD

## 2018-05-26 NOTE — Anesthesia Postprocedure Evaluation (Signed)
Anesthesia Post Note  Patient: Sheri [name]  Procedure(s) Performed: Right total knee arthroplasty reimplantation (Right Knee)     Patient location during evaluation: PACU Anesthesia Type: Spinal Level of consciousness: awake and alert, oriented and patient cooperative Pain management: pain level controlled Vital Signs Assessment: post-procedure vital signs reviewed and stable Respiratory status: spontaneous breathing, nonlabored ventilation, respiratory function stable and patient connected to nasal cannula oxygen Cardiovascular status: blood pressure returned to baseline and stable Postop Assessment: spinal receding, patient able to bend at knees, no apparent nausea or vomiting and adequate PO intake Anesthetic complications: no    Last Vitals:  Vitals:   05/26/18 1445 05/26/18 1500  BP: (!) 141/77 (!) 145/81  Pulse: 66 74  Resp: 15 14  Temp:    SpO2: 100% 99%    Last Pain:  Vitals:   05/26/18 1500  TempSrc:   PainSc: 5                  Arienne Gartin,E. Shannan Garfinkel

## 2018-05-27 ENCOUNTER — Encounter (HOSPITAL_COMMUNITY): Payer: Self-pay | Admitting: Orthopedic Surgery

## 2018-05-27 LAB — CBC
HCT: 36.6 % (ref 36.0–46.0)
Hemoglobin: 11.7 g/dL — ABNORMAL LOW (ref 12.0–15.0)
MCH: 28.3 pg (ref 26.0–34.0)
MCHC: 32 g/dL (ref 30.0–36.0)
MCV: 88.4 fL (ref 78.0–100.0)
Platelets: 387 10*3/uL (ref 150–400)
RBC: 4.14 MIL/uL (ref 3.87–5.11)
RDW: 15.4 % (ref 11.5–15.5)
WBC: 14.3 10*3/uL — ABNORMAL HIGH (ref 4.0–10.5)

## 2018-05-27 LAB — GLUCOSE, CAPILLARY
Glucose-Capillary: 149 mg/dL — ABNORMAL HIGH (ref 70–99)
Glucose-Capillary: 192 mg/dL — ABNORMAL HIGH (ref 70–99)
Glucose-Capillary: 186 mg/dL — ABNORMAL HIGH (ref 70–99)
Glucose-Capillary: 179 mg/dL — ABNORMAL HIGH (ref 70–99)

## 2018-05-27 LAB — BASIC METABOLIC PANEL
Anion gap: 10 (ref 5–15)
BUN: 16 mg/dL (ref 8–23)
CO2: 27 mmol/L (ref 22–32)
Calcium: 9.7 mg/dL (ref 8.9–10.3)
Chloride: 103 mmol/L (ref 98–111)
Creatinine, Ser: 0.77 mg/dL (ref 0.44–1.00)
GFR calc Af Amer: >60 mL/min (ref >60)
GFR calc non Af Amer: >60 mL/min (ref >60)
Glucose, Bld: 167 mg/dL — ABNORMAL HIGH (ref 70–99)
Potassium: 5.1 mmol/L (ref 3.5–5.1)
Sodium: 140 mmol/L (ref 135–145)

## 2018-05-27 MED ORDER — AMLODIPINE BESYLATE 5 MG PO TABS
5.0000 mg | ORAL_TABLET | Freq: Every day | ORAL | Status: DC
  Administered 2018-05-27 – 2018-05-28 (×2): 5 mg via ORAL
  Filled 2018-05-27 (×2): qty 1

## 2018-05-27 MED ORDER — HYDROMORPHONE HCL 1 MG/ML IJ SOLN
0.5000 mg | INTRAMUSCULAR | Status: DC | PRN
  Administered 2018-05-28: 0.5 mg via INTRAVENOUS
  Filled 2018-05-27: qty 1

## 2018-05-27 MED ORDER — HYDROMORPHONE HCL 1 MG/ML IJ SOLN
0.5000 mg | INTRAMUSCULAR | Status: DC | PRN
  Administered 2018-05-27 (×2): 0.5 mg via INTRAVENOUS
  Filled 2018-05-27 (×2): qty 0.5

## 2018-05-27 NOTE — Discharge Instructions (Signed)
° °Dr. Frank Aluisio °Total Joint Specialist °[facility] °[address] °[place], [place] [address] °[phone] ° °TOTAL KNEE REPLACEMENT POSTOPERATIVE DIRECTIONS ° °Knee Rehabilitation, Guidelines Following Surgery  °Results after knee surgery are often greatly improved when you follow the exercise, range of motion and muscle strengthening exercises prescribed by your doctor. Safety measures are also important to protect the knee from further injury. Any time any of these exercises cause you to have increased pain or swelling in your knee joint, decrease the amount until you are comfortable again and slowly increase them. If you have problems or questions, call your caregiver or physical therapist for advice.  ° °HOME CARE INSTRUCTIONS  °• Remove items at home which could result in a fall. This includes throw rugs or furniture in walking pathways.  °· ICE to the affected knee every three hours for 30 minutes at a time and then as needed for pain and swelling.  Continue to use ice on the knee for pain and swelling from surgery. You may notice swelling that will progress down to the foot and ankle.  This is normal after surgery.  Elevate the leg when you are not up walking on it.   °· Continue to use the breathing machine which will help keep your temperature down.  It is common for your temperature to cycle up and down following surgery, especially at night when you are not up moving around and exerting yourself.  The breathing machine keeps your lungs expanded and your temperature down. °· Do not place pillow under knee, focus on keeping the knee straight while resting ° °DIET °You may resume your previous home diet once your are discharged from the hospital. ° °DRESSING / WOUND CARE / SHOWERING °You may shower 3 days after surgery, but keep the wounds dry during showering.  You may use an occlusive plastic wrap (Press'n Seal for example), NO SOAKING/SUBMERGING IN THE BATHTUB.  If the bandage  gets wet, change with a clean dry gauze.  If the incision gets wet, pat the wound dry with a clean towel. °You may start showering once you are discharged home but do not submerge the incision under water. Just pat the incision dry and apply a dry gauze dressing on daily. °Change the surgical dressing daily and reapply a dry dressing each time. ° °ACTIVITY °Walk with your walker as instructed. °Use walker as long as suggested by your caregivers. °Avoid periods of inactivity such as sitting longer than an hour when not asleep. This helps prevent blood clots.  °You may resume a sexual relationship in one month or when given the OK by your doctor.  °You may return to work once you are cleared by your doctor.  °Do not drive a car for 6 weeks or until released by you surgeon.  °Do not drive while taking narcotics. ° °WEIGHT BEARING °Weight bearing as tolerated with assist device (walker, cane, etc) as directed, use it as long as suggested by your surgeon or therapist, typically at least 4-6 weeks. ° °POSTOPERATIVE CONSTIPATION PROTOCOL °Constipation - defined medically as fewer than three stools per week and severe constipation as less than one stool per week. ° °One of the most common issues patients have following surgery is constipation.  Even if you have a regular bowel pattern at home, your normal regimen is likely to be disrupted due to multiple reasons following surgery.  Combination of anesthesia, postoperative narcotics, change in appetite and fluid intake all can affect your bowels.    In order to avoid complications following surgery, here are some recommendations in order to help you during your recovery period. ° °Colace (docusate) - Pick up an over-the-counter form of Colace or another stool softener and take twice a day as long as you are requiring postoperative pain medications.  Take with a full glass of water daily.  If you experience loose stools or diarrhea, hold the colace until you stool forms back  up.  If your symptoms do not get better within 1 week or if they get worse, check with your doctor. ° °Dulcolax (bisacodyl) - Pick up over-the-counter and take as directed by the product packaging as needed to assist with the movement of your bowels.  Take with a full glass of water.  Use this product as needed if not relieved by Colace only.  ° °MiraLax (polyethylene glycol) - Pick up over-the-counter to have on hand.  MiraLax is a solution that will increase the amount of water in your bowels to assist with bowel movements.  Take as directed and can mix with a glass of water, juice, soda, coffee, or tea.  Take if you go more than two days without a movement. °Do not use MiraLax more than once per day. Call your doctor if you are still constipated or irregular after using this medication for 7 days in a row. ° °If you continue to have problems with postoperative constipation, please contact the office for further assistance and recommendations.  If you experience "the worst abdominal pain ever" or develop nausea or vomiting, please contact the office immediatly for further recommendations for treatment. ° °ITCHING ° If you experience itching with your medications, try taking only a single pain pill, or even half a pain pill at a time.  You can also use Benadryl over the counter for itching or also to help with sleep.  ° °TED HOSE STOCKINGS °Wear the elastic stockings on both legs for three weeks following surgery during the day but you may remove then at night for sleeping. ° °MEDICATIONS °See your medication summary on the “After Visit Summary” that the nursing staff will review with you prior to discharge.  You may have some home medications which will be placed on hold until you complete the course of blood thinner medication.  It is important for you to complete the blood thinner medication as prescribed by your surgeon.  Continue your approved medications as instructed at time of discharge. ° °PRECAUTIONS °If  you experience chest pain or shortness of breath - call 911 immediately for transfer to the hospital emergency department.  °If you develop a fever greater that 101 F, purulent drainage from wound, increased redness or drainage from wound, foul odor from the wound/dressing, or calf pain - CONTACT YOUR SURGEON.   °                                                °FOLLOW-UP APPOINTMENTS °Make sure you keep all of your appointments after your operation with your surgeon and caregivers. You should call the office at the above phone number and make an appointment for approximately two weeks after the date of your surgery or on the date instructed by your surgeon outlined in the "After Visit Summary". ° ° °RANGE OF MOTION AND STRENGTHENING EXERCISES  °Rehabilitation of the knee is important following a knee injury or   an operation. After just a few days of immobilization, the muscles of the thigh which control the knee become weakened and shrink (atrophy). Knee exercises are designed to build up the tone and strength of the thigh muscles and to improve knee motion. Often times heat used for twenty to thirty minutes before working out will loosen up your tissues and help with improving the range of motion but do not use heat for the first two weeks following surgery. These exercises can be done on a training (exercise) mat, on the floor, on a table or on a bed. Use what ever works the best and is most comfortable for you Knee exercises include:  °• Leg Lifts - While your knee is still immobilized in a splint or cast, you can do straight leg raises. Lift the leg to 60 degrees, hold for 3 sec, and slowly lower the leg. Repeat 10-20 times 2-3 times daily. Perform this exercise against resistance later as your knee gets better.  °• Quad and Hamstring Sets - Tighten up the muscle on the front of the thigh (Quad) and hold for 5-10 sec. Repeat this 10-20 times hourly. Hamstring sets are done by pushing the foot backward against an  object and holding for 5-10 sec. Repeat as with quad sets.  °· Leg Slides: Lying on your back, slowly slide your foot toward your buttocks, bending your knee up off the floor (only go as far as is comfortable). Then slowly slide your foot back down until your leg is flat on the floor again. °· Angel Wings: Lying on your back spread your legs to the side as far apart as you can without causing discomfort.  °A rehabilitation program following serious knee injuries can speed recovery and prevent re-injury in the future due to weakened muscles. Contact your doctor or a physical therapist for more information on knee rehabilitation.  ° °IF YOU ARE TRANSFERRED TO A SKILLED REHAB FACILITY °If the patient is transferred to a skilled rehab facility following release from the hospital, a list of the current medications will be sent to the facility for the patient to continue.  When discharged from the skilled rehab facility, please have the facility set up the patient's Home Health Physical Therapy prior to being released. Also, the skilled facility will be responsible for providing the patient with their medications at time of release from the facility to include their pain medication, the muscle relaxants, and their blood thinner medication. If the patient is still at the rehab facility at time of the two week follow up appointment, the skilled rehab facility will also need to assist the patient in arranging follow up appointment in our office and any transportation needs. ° °MAKE SURE YOU:  °• Understand these instructions.  °• Get help right away if you are not doing well or get worse.  ° ° °Pick up stool softner and laxative for home use following surgery while on pain medications. °Do not submerge incision under water. °Please use good hand washing techniques while changing dressing each day. °May shower starting three days after surgery. °Please use a clean towel to pat the incision dry following showers. °Continue to  use ice for pain and swelling after surgery. °Do not use any lotions or creams on the incision until instructed by your surgeon. ° °

## 2018-05-27 NOTE — Evaluation (Signed)
Physical Therapy Evaluation Patient Details Name: Sheri [name] MRN: [id number] DOB: May 22, 1951 Today's Date: 05/27/2018   History of Present Illness  S/P reimplantation R TKA.  Clinical Impression  The  Patient  Has been mobilizing in room. Ambulated x 90'. Plans DC tomorrow. Pt admitted with above diagnosis. Pt currently with functional limitations due to the deficits listed below (see PT Problem List). Pt will benefit from skilled PT to increase their independence and safety with mobility to allow discharge to the venue listed below.        Follow Up Recommendations Follow surgeon's recommendation for DC plan and follow-up therapies;Home health PT    Equipment Recommendations  None recommended by PT    Recommendations for Other Services       Precautions / Restrictions Precautions Precautions: Knee;Fall Precaution Comments: did not wear KI      Mobility  Bed Mobility Overal bed mobility: Modified Independent                Transfers Overall transfer level: Needs assistance Equipment used: Rolling walker (2 wheeled) Transfers: Sit to/from Stand Sit to Stand: Supervision            Ambulation/Gait Ambulation/Gait assistance: Supervision Gait Distance (Feet): 90 Feet Assistive device: Rolling walker (2 wheeled) Gait Pattern/deviations: Step-to pattern;Step-through pattern     General Gait Details: no buckling without KI  Stairs            Wheelchair Mobility    Modified Rankin (Stroke Patients Only)       Balance                                             Pertinent Vitals/Pain Pain Assessment: 0-10 Pain Score: 7  Pain Location: right knee Pain Descriptors / Indicators: Aching;Discomfort;Grimacing Pain Intervention(s): Premedicated before session;Monitored during session;Limited activity within patient's tolerance;Repositioned;Ice applied    Home Living Family/patient expects to be discharged to:: Private  residence Living Arrangements: Spouse/significant other Available Help at Discharge: Family Type of Home: House Home Access: Stairs to enter   [employer] of Steps: 1 small step Home Layout: Two level;Able to live on main level with bedroom/bathroom Home Equipment: Kasandra Knudsen - single point;Bedside commode;Tub bench;Walker - 4 wheels;Walker - 2 wheels      Prior Function Level of Independence: Independent with assistive device(s)         Comments: at times used cane     Hand Dominance        Extremity/Trunk Assessment   Upper Extremity Assessment Upper Extremity Assessment: Overall WFL for tasks assessed    Lower Extremity Assessment Lower Extremity Assessment: RLE deficits/detail RLE Deficits / Details: + SLR, knee flexion 10-50     Cervical / Trunk Assessment Cervical / Trunk Assessment: Normal  Communication   Communication: No difficulties  Cognition Arousal/Alertness: Awake/alert Behavior During Therapy: WFL for tasks assessed/performed Overall Cognitive Status: Within Functional Limits for tasks assessed                                        General Comments      Exercises Total Joint Exercises Ankle Circles/Pumps: AROM;Both;10 reps Quad Sets: AROM;10 reps;Both Heel Slides: AAROM;Right;10 reps Hip ABduction/ADduction: AAROM;Right;10 reps Straight Leg Raises: AAROM;Right;5 reps   Assessment/Plan    PT Assessment Patient  needs continued PT services  PT Problem List Decreased strength;Decreased range of motion;Decreased knowledge of use of DME;Decreased activity tolerance;Decreased safety awareness;Decreased knowledge of precautions;Decreased mobility;Pain       PT Treatment Interventions DME instruction;Gait training;Functional mobility training;Therapeutic activities;Patient/family education    PT Goals (Current goals can be found in the Care Plan section)  Acute Rehab PT Goals Patient Stated Goal: to go home PT Goal  Formulation: With patient Time For Goal Achievement: 05/29/18 Potential to Achieve Goals: Good    Frequency 7X/week   Barriers to discharge        Co-evaluation               AM-PAC PT "6 Clicks" Daily Activity  Outcome Measure Difficulty turning over in bed (including adjusting bedclothes, sheets and blankets)?: None Difficulty moving from lying on back to sitting on the side of the bed? : None Difficulty sitting down on and standing up from a chair with arms (e.g., wheelchair, bedside commode, etc,.)?: A Little Help needed moving to and from a bed to chair (including a wheelchair)?: A Little Help needed walking in hospital room?: A Lot Help needed climbing 3-5 steps with a railing? : Total 6 Click Score: 17    End of Session   Activity Tolerance: Patient tolerated treatment well Patient left: in bed;with call bell/phone within reach;with family/visitor present Nurse Communication: Mobility status PT Visit Diagnosis: Unsteadiness on feet (R26.81);Pain Pain - Right/Left: Right Pain - part of body: Knee    Time: 4734-0370 PT Time Calculation (min) (ACUTE ONLY): 31 min   Charges:   PT Evaluation $PT Eval Low Complexity: 1 Low PT Treatments $Gait Training: 8-22 mins        Tresa Endo PT Acute Rehabilitation Services Pager [phone] Office [phone]   Claretha Cooper 05/27/2018, 3:21 PM

## 2018-05-27 NOTE — Progress Notes (Signed)
PT Cancellation Note  Patient Details Name: LYNDZIE ZENTZ MRN: [id number] DOB: 1951-05-30   Cancelled Treatment:    Reason Eval/Treat Not Completed: Pain limiting ability to participate, attempted x 3 for PTevaluation. Patient reports pain is limiting. Have scheduled PT at 1;00 hopeful medication is on board. [place] Pager [phone] Office [phone]  Claretha Cooper 05/27/2018, 12:06 PM

## 2018-05-27 NOTE — Progress Notes (Signed)
   Subjective: 1 Day Post-Op Procedure(s) (LRB): Right total knee arthroplasty reimplantation (Right) Patient reports pain as moderate.   Patient seen in rounds with Dr. Aluisio. Patient is well, and has had no acute complaints or problems other than pain in the right knee. No issues overnight. Foley catheter removed this AM. Denies chest pain, SOB or calf pain. We will start therapy today.   Objective: Vital signs in last 24 hours: Temp:  [98.2 F (36.8 C)-99.2 F (37.3 C)] 98.3 F (36.8 C) (09/26 0514) Pulse Rate:  [62-93] 65 (09/26 0514) Resp:  [9-27] 16 (09/26 0514) BP: (114-165)/(71-96) 135/75 (09/26 0514) SpO2:  [96 %-100 %] 100 % (09/26 0514) Weight:  [98.7 kg] 98.7 kg (09/25 0940)  Intake/Output from previous day:  Intake/Output Summary (Last 24 hours) at 05/27/2018 0706 Last data filed at 05/27/2018 0600 Gross per 24 hour  Intake 2796.3 ml  Output 2095 ml  Net 701.3 ml     Labs: Recent Labs    05/27/18 0443  HGB 11.7*   Recent Labs    05/27/18 0443  WBC 14.3*  RBC 4.14  HCT 36.6  PLT 387   Recent Labs    05/27/18 0443  NA 140  K 5.1  CL 103  CO2 27  BUN 16  CREATININE 0.77  GLUCOSE 167*  CALCIUM 9.7   Exam: General - Patient is Alert and Oriented Extremity - Neurologically intact Neurovascular intact Sensation intact distally Dorsiflexion/Plantar flexion intact Dressing - dressing C/D/I Motor Function - intact, moving foot and toes well on exam.   Past Medical History:  Diagnosis Date  . Allergic rhinitis   . Degenerative disc disease, cervical   . Diabetes mellitus without complication (HCC)    type two  . Diarrhea 04/2012   colon nl, BX NPD; no response to cholestyramin 03-2012 no response to align; transient resp to sucralfate 12-13  . H/O CT scan of abdomen 08/2012   neg; egd bile reflux gastritisand linear eros; transient resp to sucralfate   . H/O ulcer disease   . Heart murmur    hx of " comes and goes"   . History of blood  transfusion   . Hyperlipidemia   . Hyperplastic polyps of stomach   . Hypertension   . Hypothyroidism   . IBS (irritable bowel syndrome)   . Kissing, osteophytes    Dr Roy  . Menopause   . Mild anemia 06/2011   14% iron sat,ferritin 14- from Metformin - anemia resolved   . Obesity   . Osteoarthritis   . PONV (postoperative nausea and vomiting)    years ago     Assessment/Plan: 1 Day Post-Op Procedure(s) (LRB): Right total knee arthroplasty reimplantation (Right) Principal Problem:   Infection of total right knee replacement (HCC) Active Problems:   Septic arthritis of knee, right (HCC)  Estimated body mass index is 37.33 kg/m as calculated from the following:   Height as of this encounter: 5' 4" (1.626 m).   Weight as of this encounter: 98.7 kg. Advance diet Up with therapy  DVT Prophylaxis - Aspirin Weight bearing as tolerated. D/C O2 and pulse ox and try on room air. Hemovac drains pulled without difficulty, will begin therapy today.  Plan is to go Home after hospital stay. Plan for discharge tomorrow if meeting goals with therapy.  Kristie Edmisten, PA-C Orthopedic Surgery 05/27/2018, 7:06 AM  

## 2018-05-27 NOTE — Care Management Note (Signed)
Case Management Note  Patient Details  Name: Sheri [name] MRN: [id number] Date of Birth: 04-25-1951  Subjective/Objective:     Discharge planning, spoke with patient and sister at beside.  Chose [facility] for [facility] services, PT to eval and treat, requesting Stanton Kidney as therapist.         Action/Plan: Contacted [facility] for referral, they have accepted. Has RW and 3-n-1. [phone]      Expected Discharge Date:                  Expected Discharge Plan:  [place]  In-House Referral:  NA  Discharge planning Services  CM Consult  Post Acute Care Choice:  Durable Medical Equipment, Home Health Choice offered to:  Patient  DME Arranged:  [name] DME Agency:  NA  HH Arranged:  PT HH Agency:  [facility]  Status of Service:  Completed, signed off  If discussed at [place] of Stay Meetings, dates discussed:    Additional Comments:  Guadalupe Maple, RN 05/27/2018, 10:32 AM

## 2018-05-28 LAB — CBC
HCT: 36.2 % (ref 36.0–46.0)
Hemoglobin: 11.5 g/dL — ABNORMAL LOW (ref 12.0–15.0)
MCH: 28.1 pg (ref 26.0–34.0)
MCHC: 31.8 g/dL (ref 30.0–36.0)
MCV: 88.5 fL (ref 78.0–100.0)
Platelets: 420 10*3/uL — ABNORMAL HIGH (ref 150–400)
RBC: 4.09 MIL/uL (ref 3.87–5.11)
RDW: 15.6 % — ABNORMAL HIGH (ref 11.5–15.5)
WBC: 12.7 10*3/uL — ABNORMAL HIGH (ref 4.0–10.5)

## 2018-05-28 LAB — BASIC METABOLIC PANEL
Anion gap: 11 (ref 5–15)
BUN: 18 mg/dL (ref 8–23)
CO2: 28 mmol/L (ref 22–32)
Calcium: 9.9 mg/dL (ref 8.9–10.3)
Chloride: 103 mmol/L (ref 98–111)
Creatinine, Ser: 0.72 mg/dL (ref 0.44–1.00)
GFR calc Af Amer: >60 mL/min (ref >60)
GFR calc non Af Amer: >60 mL/min (ref >60)
Glucose, Bld: 142 mg/dL — ABNORMAL HIGH (ref 70–99)
Potassium: 3.7 mmol/L (ref 3.5–5.1)
Sodium: 142 mmol/L (ref 135–145)

## 2018-05-28 LAB — GLUCOSE, CAPILLARY: Glucose-Capillary: 103 mg/dL — ABNORMAL HIGH (ref 70–99)

## 2018-05-28 MED ORDER — METHOCARBAMOL 500 MG PO TABS: 500.0000 mg | ORAL_TABLET | Freq: Four times a day (QID) | ORAL | 0 refills | Status: DC | PRN

## 2018-05-28 MED ORDER — OXYCODONE HCL 5 MG PO TABS: 5.0000 mg | ORAL_TABLET | Freq: Four times a day (QID) | ORAL | 0 refills | Status: DC | PRN

## 2018-05-28 MED ORDER — ASPIRIN 325 MG PO TBEC: 325.0000 mg | DELAYED_RELEASE_TABLET | Freq: Two times a day (BID) | ORAL | 0 refills | Status: AC

## 2018-05-28 MED ORDER — TRAMADOL HCL 50 MG PO TABS: 50.0000 mg | ORAL_TABLET | Freq: Four times a day (QID) | ORAL | 0 refills | Status: AC | PRN

## 2018-05-28 NOTE — Progress Notes (Signed)
   Subjective: 2 Days Post-Op Procedure(s) (LRB): Right total knee arthroplasty reimplantation (Right) Patient reports pain as moderate.   Patient seen in rounds for [name]. Patient is well, and has had no acute complaints or problems other than pain in the right knee. States she is ready to go home. Voiding without difficulty and positive flatus. Denies chest pain, SOB or calf pain. Plan is to go Home after hospital stay.  Objective: Vital signs in last 24 hours: Temp:  [98.3 F (36.8 C)-98.8 F (37.1 C)] 98.3 F (36.8 C) (09/27 0445) Pulse Rate:  [66-79] 76 (09/27 0445) Resp:  [16-18] 16 (09/27 0445) BP: (137-155)/(73-85) 155/85 (09/27 0445) SpO2:  [98 %-100 %] 99 % (09/27 0445)  Intake/Output from previous day:  Intake/Output Summary (Last 24 hours) at 05/28/2018 0747 Last data filed at 05/28/2018 0437 Gross per 24 hour  Intake 1480 ml  Output 550 ml  Net 930 ml    Labs: Recent Labs    05/27/18 0443 05/28/18 0454  HGB 11.7* 11.5*   Recent Labs    05/27/18 0443 05/28/18 0454  WBC 14.3* 12.7*  RBC 4.14 4.09  HCT 36.6 36.2  PLT 387 420*   Recent Labs    05/27/18 0443 05/28/18 0454  NA 140 142  K 5.1 3.7  CL 103 103  CO2 27 28  BUN 16 18  CREATININE 0.77 0.72  GLUCOSE 167* 142*  CALCIUM 9.7 9.9   Exam: General - Patient is Alert and Oriented Extremity - Neurologically intact Neurovascular intact Sensation intact distally Dorsiflexion/Plantar flexion intact Dressing/Incision - clean, dry, no drainage Motor Function - intact, moving foot and toes well on exam.   Past Medical History:  Diagnosis Date  . Allergic rhinitis   . Degenerative disc disease, cervical   . Diabetes mellitus without complication ([place])    type two  . Diarrhea 04/2012   colon nl, BX NPD; no response to cholestyramin 03-2012 no response to align; transient resp to sucralfate 12-13  . H/O CT scan of abdomen 08/2012   neg; egd bile reflux gastritisand linear eros; transient  resp to sucralfate   . H/O ulcer disease   . Heart murmur    hx of " comes and goes"   . History of blood transfusion   . Hyperlipidemia   . Hyperplastic polyps of stomach   . Hypertension   . Hypothyroidism   . IBS (irritable bowel syndrome)   . Kissing, osteophytes    Dr Carloyn Manner  . Menopause   . Mild anemia 06/2011   14% iron sat,ferritin 14- from Metformin - anemia resolved   . Obesity   . Osteoarthritis   . PONV (postoperative nausea and vomiting)    years ago     Assessment/Plan: 2 Days Post-Op Procedure(s) (LRB): Right total knee arthroplasty reimplantation (Right) Principal Problem:   Infection of total right knee replacement (HCC) Active Problems:   Septic arthritis of knee, right (HCC)  Estimated body mass index is 37.33 kg/m as calculated from the following:   Height as of this encounter: '5\' 4"'$  (1.626 m).   Weight as of this encounter: 98.7 kg. Up with therapy D/C IV fluids  DVT Prophylaxis - Aspirin Weight-bearing as tolerated  Plan for discharge to home today with HHPT. Follow-up in the office in 2 weeks with [name].  Sheri Duty, PA-C Orthopedic Surgery 05/28/2018, 7:47 AM

## 2018-05-28 NOTE — Progress Notes (Signed)
Physical Therapy Treatment Patient Details Name: Sheri [name] MRN: [id number] DOB: 08/26/1951 Today's Date: 05/28/2018    History of Present Illness S/P reimplantation R TKA.  Patient is progressing well. Ready for DC.  PT Comments       Follow Up Recommendations   HHPT     Equipment Recommendations       Recommendations for Other Services       Precautions / Restrictions Precautions Precautions: Knee;Fall Precaution Comments: did not wear KI    Mobility  Bed Mobility Overal bed mobility: Modified Independent                Transfers Overall transfer level: Modified independent                  Ambulation/Gait Ambulation/Gait assistance: Supervision Gait Distance (Feet): 90 Feet Assistive device: Rolling walker (2 wheeled) Gait Pattern/deviations: Step-to pattern     General Gait Details: no buckling without KI   Stairs             Wheelchair Mobility    Modified Rankin (Stroke Patients Only)       Balance                                            Cognition Arousal/Alertness: Awake/alert                                            Exercises      General Comments        Pertinent Vitals/Pain Pain Score: 5  Pain Location: right knee Pain Descriptors / Indicators: Aching;Discomfort;Grimacing Pain Intervention(s): Monitored during session;Premedicated before session;Ice applied    Home Living                      Prior Function            PT Goals (current goals can now be found in the care plan section) Progress towards PT goals: Progressing toward goals    Frequency    7X/week      PT Plan Current plan remains appropriate    Co-evaluation              AM-PAC PT "6 Clicks" Daily Activity  Outcome Measure  Difficulty turning over in bed (including adjusting bedclothes, sheets and blankets)?: None Difficulty moving from lying on back to sitting  on the side of the bed? : None Difficulty sitting down on and standing up from a chair with arms (e.g., wheelchair, bedside commode, etc,.)?: A Little Help needed moving to and from a bed to chair (including a wheelchair)?: A Little Help needed walking in hospital room?: A Little Help needed climbing 3-5 steps with a railing? : A Lot 6 Click Score: 19    End of Session   Activity Tolerance: Patient tolerated treatment well Patient left: in bed;with call bell/phone within reach;with family/visitor present Nurse Communication: Mobility status Pain - Right/Left: Right Pain - part of body: Knee     Time: 8469-6295 PT Time Calculation (min) (ACUTE ONLY): 15 min  Charges:  $Gait Training: 8-22 mins                     [place] Pager  [place]    Claretha Cooper 05/28/2018, 2:09 PM

## 2018-05-29 DIAGNOSIS — E039 Hypothyroidism, unspecified: Secondary | ICD-10-CM | POA: Diagnosis not present

## 2018-05-29 DIAGNOSIS — M1990 Unspecified osteoarthritis, unspecified site: Secondary | ICD-10-CM | POA: Diagnosis not present

## 2018-05-29 DIAGNOSIS — Z4733 Aftercare following explantation of knee joint prosthesis: Secondary | ICD-10-CM | POA: Diagnosis not present

## 2018-05-29 DIAGNOSIS — E7800 Pure hypercholesterolemia, unspecified: Secondary | ICD-10-CM | POA: Diagnosis not present

## 2018-05-29 DIAGNOSIS — Z6837 Body mass index (BMI) 37.0-37.9, adult: Secondary | ICD-10-CM | POA: Diagnosis not present

## 2018-05-29 DIAGNOSIS — Z96651 Presence of right artificial knee joint: Secondary | ICD-10-CM | POA: Diagnosis not present

## 2018-05-29 DIAGNOSIS — D649 Anemia, unspecified: Secondary | ICD-10-CM | POA: Diagnosis not present

## 2018-05-29 DIAGNOSIS — Z96652 Presence of left artificial knee joint: Secondary | ICD-10-CM | POA: Diagnosis not present

## 2018-05-29 DIAGNOSIS — K589 Irritable bowel syndrome without diarrhea: Secondary | ICD-10-CM | POA: Diagnosis not present

## 2018-05-29 DIAGNOSIS — Z79891 Long term (current) use of opiate analgesic: Secondary | ICD-10-CM | POA: Diagnosis not present

## 2018-05-29 DIAGNOSIS — I10 Essential (primary) hypertension: Secondary | ICD-10-CM | POA: Diagnosis not present

## 2018-05-29 DIAGNOSIS — E119 Type 2 diabetes mellitus without complications: Secondary | ICD-10-CM | POA: Diagnosis not present

## 2018-05-29 DIAGNOSIS — M5030 Other cervical disc degeneration, unspecified cervical region: Secondary | ICD-10-CM | POA: Diagnosis not present

## 2018-05-29 DIAGNOSIS — E669 Obesity, unspecified: Secondary | ICD-10-CM | POA: Diagnosis not present

## 2018-05-31 NOTE — Discharge Summary (Signed)
Physician Discharge Summary   [name] MRN: [id number] DOB/AGE: [birth year] 67 y.o.  Admit date: 05/26/2018 Discharge date: 05/28/2018  Primary Diagnosis: Right knee resection arthroplasty   Admission Diagnoses:  Past Medical History:  Diagnosis Date  . Allergic rhinitis   . Degenerative disc disease, cervical   . Diabetes mellitus without complication ([place])    type two  . Diarrhea 04/2012   colon nl, BX NPD; no response to cholestyramin 03-2012 no response to align; transient resp to sucralfate 12-13  . H/O CT scan of abdomen 08/2012   neg; egd bile reflux gastritisand linear eros; transient resp to sucralfate   . H/O ulcer disease   . Heart murmur    hx of " comes and goes"   . History of blood transfusion   . Hyperlipidemia   . Hyperplastic polyps of stomach   . Hypertension   . Hypothyroidism   . IBS (irritable bowel syndrome)   . Kissing, osteophytes    Dr Carloyn Manner  . Menopause   . Mild anemia 06/2011   14% iron sat,ferritin 14- from Metformin - anemia resolved   . Obesity   . Osteoarthritis   . PONV (postoperative nausea and vomiting)    years ago    Discharge Diagnoses:   Principal Problem:   Infection of total right knee replacement (HCC) Active Problems:   Septic arthritis of knee, right (HCC)  Estimated body mass index is 37.33 kg/m as calculated from the following:   Height as of this encounter: 5' 4" (1.626 m).   Weight as of this encounter: 98.7 kg.  Procedure:  Procedure(s) (LRB): Right total knee arthroplasty reimplantation (Right)   Consults: None  HPI: The patient is a 67 year old female who had a resection arthroplasty of her right knee approximately 3 months ago secondary to periprosthetic joint infection. She has had IV antibiotics and oral antibiotics. Lab work has come back normal. She presents now for total knee arthroplasty reimplantation.  Laboratory Data: Admission on 05/26/2018, Discharged on 05/28/2018  Component  Date Value Ref Range Status  . Glucose-Capillary 05/26/2018 144* 70 - 99 mg/dL Final  . Specimen Description 05/26/2018 SYNOVIAL RIGHT KNEE   Final  . Special Requests 05/26/2018 NONE   Final  . Gram Stain 05/26/2018    Final                   Value:NO WBC SEEN NO ORGANISMS SEEN Gram Stain Report Called to,Read Back By and Verified With: NOTIFIED ROSE,J RN _0  ON 05/26/18 JACKSON,K Performed at [facility], [place] [address] [place], [place] [address]   . Report Status 05/26/2018 05/26/2018 FINAL   Final  . Glucose-Capillary 05/26/2018 130* 70 - 99 mg/dL Final  . WBC 05/27/2018 14.3* 4.0 - 10.5 K/uL Final  . RBC 05/27/2018 4.14  3.87 - 5.11 MIL/uL Final  . Hemoglobin 05/27/2018 11.7* 12.0 - 15.0 g/dL Final  . HCT 05/27/2018 36.6  36.0 - 46.0 % Final  . MCV 05/27/2018 88.4  78.0 - 100.0 fL Final  . MCH 05/27/2018 28.3  26.0 - 34.0 pg Final  . MCHC 05/27/2018 32.0  30.0 - 36.0 g/dL Final  . RDW 05/27/2018 15.4  11.5 - 15.5 % Final  . Platelets 05/27/2018 387  150 - 400 K/uL Final   Performed at [facility], [place] [address], [place], [place] [address]  . Sodium 05/27/2018 140  135 - 145 mmol/L Final  . Potassium 05/27/2018 5.1  3.5 -  5.1 mmol/L Final  . Chloride 05/27/2018 103  98 - 111 mmol/L Final  . CO2 05/27/2018 27  22 - 32 mmol/L Final  . Glucose, Bld 05/27/2018 167* 70 - 99 mg/dL Final  . BUN 05/27/2018 16  8 - 23 mg/dL Final  . Creatinine, Ser 05/27/2018 0.77  0.44 - 1.00 mg/dL Final  . Calcium 05/27/2018 9.7  8.9 - 10.3 mg/dL Final  . GFR calc non Af Amer 05/27/2018 >60  >60 mL/min Final  . GFR calc Af Amer 05/27/2018 >60  >60 mL/min Final   Comment: (NOTE) The eGFR has been calculated using the CKD EPI equation. This calculation has not been validated in all clinical situations. eGFR's persistently <60 mL/min signify possible Chronic Kidney Disease.   Georgiann Hahn gap 05/27/2018 10  5 - 15 Final   Performed at [facility], [place] [address] [place], [place] [address]  . Glucose-Capillary 05/26/2018 209* 70 - 99 mg/dL Final  . Glucose-Capillary 05/27/2018 149* 70 - 99 mg/dL Final  . Glucose-Capillary 05/27/2018 192* 70 - 99 mg/dL Final  . WBC 05/28/2018 12.7* 4.0 - 10.5 K/uL Final  . RBC 05/28/2018 4.09  3.87 - 5.11 MIL/uL Final  . Hemoglobin 05/28/2018 11.5* 12.0 - 15.0 g/dL Final  . HCT 05/28/2018 36.2  36.0 - 46.0 % Final  . MCV 05/28/2018 88.5  78.0 - 100.0 fL Final  . MCH 05/28/2018 28.1  26.0 - 34.0 pg Final  . MCHC 05/28/2018 31.8  30.0 - 36.0 g/dL Final  . RDW 05/28/2018 15.6* 11.5 - 15.5 % Final  . Platelets 05/28/2018 420* 150 - 400 K/uL Final   Performed at [facility], [place] [address] [place], [place] [address]  . Sodium 05/28/2018 142  135 - 145 mmol/L Final  . Potassium 05/28/2018 3.7  3.5 - 5.1 mmol/L Final   Comment: REPEATED TO VERIFY DELTA CHECK NOTED   . Chloride 05/28/2018 103  98 - 111 mmol/L Final  . CO2 05/28/2018 28  22 - 32 mmol/L Final  . Glucose, Bld 05/28/2018 142* 70 - 99 mg/dL Final  . BUN 05/28/2018 18  8 - 23 mg/dL Final  . Creatinine, Ser 05/28/2018 0.72  0.44 - 1.00 mg/dL Final  . Calcium 05/28/2018 9.9  8.9 - 10.3 mg/dL Final  . GFR calc non Af Amer 05/28/2018 >60  >60 mL/min Final  . GFR calc Af Amer 05/28/2018 >60  >60 mL/min Final   Comment: (NOTE) The eGFR has been calculated using the CKD EPI equation. This calculation has not been validated in all clinical situations. eGFR's persistently <60 mL/min signify possible Chronic Kidney Disease.   Georgiann Hahn gap 05/28/2018 11  5 - 15 Final   Performed at [facility], [place] [address], [place], [place] [address]  . Glucose-Capillary 05/27/2018 186* 70 - 99 mg/dL Final  . Glucose-Capillary 05/27/2018 179* 70 - 99 mg/dL Final  . Glucose-Capillary 05/28/2018 103* 70 - 99 mg/dL Final  Hospital Outpatient Visit on 05/19/2018  Component Date Value Ref Range Status  . aPTT  05/19/2018 31  24 - 36 seconds Final   Performed at [facility], [place] [address], [place], [place] [address]  . WBC 05/19/2018 9.5  4.0 - 10.5 K/uL Final  . RBC 05/19/2018 4.39  3.87 - 5.11 MIL/uL Final  . Hemoglobin 05/19/2018 12.5  12.0 - 15.0 g/dL Final  . HCT 05/19/2018 38.5  36.0 - 46.0 % Final  . MCV 05/19/2018 87.7  78.0 - 100.0 fL Final  . MCH 05/19/2018 28.5  26.0 - 34.0 pg Final  . MCHC 05/19/2018 32.5  30.0 - 36.0 g/dL Final  . RDW 05/19/2018 15.9* 11.5 - 15.5 % Final  . Platelets 05/19/2018 455* 150 - 400 K/uL Final   Performed at [facility], [place] [address], [place], [place] [address]  . Sodium 05/19/2018 142  135 - 145 mmol/L Final  . Potassium 05/19/2018 5.0  3.5 - 5.1 mmol/L Final  . Chloride 05/19/2018 105  98 - 111 mmol/L Final  . CO2 05/19/2018 28  22 - 32 mmol/L Final  . Glucose, Bld 05/19/2018 141* 70 - 99 mg/dL Final  . BUN 05/19/2018 22  8 - 23 mg/dL Final  . Creatinine, Ser 05/19/2018 0.92  0.44 - 1.00 mg/dL Final  . Calcium 05/19/2018 10.3  8.9 - 10.3 mg/dL Final  . Total Protein 05/19/2018 7.4  6.5 - 8.1 g/dL Final  . Albumin 05/19/2018 4.4  3.5 - 5.0 g/dL Final  . AST 05/19/2018 20  15 - 41 U/L Final  . ALT 05/19/2018 16  0 - 44 U/L Final  . Alkaline Phosphatase 05/19/2018 77  38 - 126 U/L Final  . Total Bilirubin 05/19/2018 0.5  0.3 - 1.2 mg/dL Final  . GFR calc non Af Amer 05/19/2018 >60  >60 mL/min Final  . GFR calc Af Amer 05/19/2018 >60  >60 mL/min Final   Comment: (NOTE) The eGFR has been calculated using the CKD EPI equation. This calculation has not been validated in all clinical situations. eGFR's persistently <60 mL/min signify possible Chronic Kidney Disease.   Georgiann Hahn gap 05/19/2018 9  5 - 15 Final   Performed at [facility], [place] [address] [place], [place] [address]  . Prothrombin Time 05/19/2018 12.5  11.4 - 15.2 seconds Final  . INR 05/19/2018 0.94   Final   Performed at [facility], [place] [address] [place], [place] [address]  . ABO/RH(D) 05/19/2018 A POS   Final  . Antibody Screen 05/19/2018 NEG   Final  . Sample Expiration 05/19/2018 05/29/2018   Final  . Extend sample reason 05/19/2018    Final                   Value:NO TRANSFUSIONS OR PREGNANCY IN THE PAST 3 MONTHS Performed at [facility], [place] [address] [place], [place] [address]   . Hgb A1c MFr Bld 05/19/2018 6.2* 4.8 - 5.6 % Final   Comment: (NOTE)         Prediabetes: 5.7 - 6.4         Diabetes: >6.4         Glycemic control for adults with diabetes: <7.0   . Mean Plasma Glucose 05/19/2018 131  mg/dL Final   Comment: (NOTE) Performed At: [facility] [place], [place] [id number] Rush Farmer MD FO:[id number]   . MRSA, PCR 05/19/2018 NEGATIVE  NEGATIVE Final  . Staphylococcus aureus 05/19/2018 NEGATIVE  NEGATIVE Final   Comment: (NOTE) The Xpert SA Assay (FDA approved for NASAL specimens in patients 60 years of age and older), is one component of a comprehensive surveillance program. It is not intended to diagnose infection nor to guide or monitor treatment. Performed at [facility], [place] [address] [place], [place] [address]   . Glucose-Capillary 05/19/2018 118* 70 - 99 mg/dL Final  Office Visit on 04/19/2018  Component Date Value Ref Range Status  .  CRP 04/19/2018 5.3  <8.0 mg/L Final  . Sed Rate 04/19/2018 14  0 - 30 mm/h Final     X-Rays:No results found.  EKG: Orders placed or performed during the hospital encounter of 11/23/17  . EKG 12 lead  . EKG 12 lead  . EKG 12-Lead  . EKG 12-Lead     Hospital Course: Sheri [name] is a 67 y.o. who was admitted to [facility]. They were brought to the operating room on 05/26/2018 and underwent Procedure(s): Right total knee arthroplasty reimplantation.  Patient tolerated the procedure well and was later transferred to the recovery room and  then to the orthopaedic floor for postoperative care. They were given PO and IV analgesics for pain control following their surgery. They were given 24 hours of postoperative antibiotics of  Anti-infectives (From admission, onward)   Start     Dose/Rate Route Frequency Ordered Stop   05/26/18 1730  ceFAZolin (ANCEF) IVPB 2g/100 mL premix     2 g 200 mL/hr over 30 Minutes Intravenous Every 6 hours 05/26/18 1618 05/27/18 0004   05/26/18 0930  ceFAZolin (ANCEF) IVPB 2g/100 mL premix     2 g 200 mL/hr over 30 Minutes Intravenous On call to O.R. 05/26/18 5027 05/26/18 1125     and started on DVT prophylaxis in the form of Aspirin.   PT and OT were ordered for total joint protocol. Discharge planning consulted to help with postop disposition and equipment needs. Patient had a decent night on the evening of surgery. They started to get up OOB with therapy on POD #1. Hemovac drain was pulled without difficulty on day one. Continued to work with therapy into POD #2. Pt was seen during rounds on day two and was ready to go home pending progress with therapy. Dressing was changed and the incision was clean, dry and intact with no drainage. Pt worked with therapy for one additional session and was meeting their goals. She was discharged to home later that day in stable condition.  Diet: Diabetic diet Activity: WBAT Follow-up: in 2 weeks with [name] Disposition: Home with HHPT Discharged Condition: stable   Discharge Instructions    Call MD / Call 911   Complete by:  As directed    If you experience chest pain or shortness of breath, CALL 911 and be transported to the hospital emergency room.  If you develope a fever above 101 F, pus (white drainage) or increased drainage or redness at the wound, or calf pain, call your surgeon's office.   Change dressing   Complete by:  As directed    Change the dressing daily with sterile 4 x 4 inch gauze dressing and apply TED hose.   Constipation Prevention    Complete by:  As directed    Drink plenty of fluids.  Prune juice may be helpful.  You may use a stool softener, such as Colace (over the counter) 100 mg twice a day.  Use MiraLax (over the counter) for constipation as needed.   Diet - low sodium heart healthy   Complete by:  As directed    Discharge instructions   Complete by:  As directed    Dr. Gaynelle Arabian Total Joint Specialist [facility] [address] [place], [place] [address] [phone]  TOTAL KNEE REPLACEMENT POSTOPERATIVE DIRECTIONS  Knee Rehabilitation, Guidelines Following Surgery  Results after knee surgery are often greatly improved when you follow the exercise, range of motion and muscle strengthening exercises prescribed  by your doctor. Safety measures are also important to protect the knee from further injury. Any time any of these exercises cause you to have increased pain or swelling in your knee joint, decrease the amount until you are comfortable again and slowly increase them. If you have problems or questions, call your caregiver or physical therapist for advice.   HOME CARE INSTRUCTIONS  Remove items at home which could result in a fall. This includes throw rugs or furniture in walking pathways.  ICE to the affected knee every three hours for 30 minutes at a time and then as needed for pain and swelling.  Continue to use ice on the knee for pain and swelling from surgery. You may notice swelling that will progress down to the foot and ankle.  This is normal after surgery.  Elevate the leg when you are not up walking on it.   Continue to use the breathing machine which will help keep your temperature down.  It is common for your temperature to cycle up and down following surgery, especially at night when you are not up moving around and exerting yourself.  The breathing machine keeps your lungs expanded and your temperature down. Do not place pillow under knee, focus on keeping the knee straight while  resting   DIET You may resume your previous home diet once your are discharged from the hospital.  DRESSING / WOUND CARE / SHOWERING You may shower 3 days after surgery, but keep the wounds dry during showering.  You may use an occlusive plastic wrap (Press'n Seal for example), NO SOAKING/SUBMERGING IN THE BATHTUB.  If the bandage gets wet, change with a clean dry gauze.  If the incision gets wet, pat the wound dry with a clean towel. You may start showering once you are discharged home but do not submerge the incision under water. Just pat the incision dry and apply a dry gauze dressing on daily. Change the surgical dressing daily and reapply a dry dressing each time.  ACTIVITY Walk with your walker as instructed. Use walker as long as suggested by your caregivers. Avoid periods of inactivity such as sitting longer than an hour when not asleep. This helps prevent blood clots.  You may resume a sexual relationship in one month or when given the OK by your doctor.  You may return to work once you are cleared by your doctor.  Do not drive a car for 6 weeks or until released by you surgeon.  Do not drive while taking narcotics.  WEIGHT BEARING Weight bearing as tolerated with assist device (walker, cane, etc) as directed, use it as long as suggested by your surgeon or therapist, typically at least 4-6 weeks.  POSTOPERATIVE CONSTIPATION PROTOCOL Constipation - defined medically as fewer than three stools per week and severe constipation as less than one stool per week.  One of the most common issues patients have following surgery is constipation.  Even if you have a regular bowel pattern at home, your normal regimen is likely to be disrupted due to multiple reasons following surgery.  Combination of anesthesia, postoperative narcotics, change in appetite and fluid intake all can affect your bowels.  In order to avoid complications following surgery, here are some recommendations in order to  help you during your recovery period.  Colace (docusate) - Pick up an over-the-counter form of Colace or another stool softener and take twice a day as long as you are requiring postoperative pain medications.  Take with a full  glass of water daily.  If you experience loose stools or diarrhea, hold the colace until you stool forms back up.  If your symptoms do not get better within 1 week or if they get worse, check with your doctor.  Dulcolax (bisacodyl) - Pick up over-the-counter and take as directed by the product packaging as needed to assist with the movement of your bowels.  Take with a full glass of water.  Use this product as needed if not relieved by Colace only.   MiraLax (polyethylene glycol) - Pick up over-the-counter to have on hand.  MiraLax is a solution that will increase the amount of water in your bowels to assist with bowel movements.  Take as directed and can mix with a glass of water, juice, soda, coffee, or tea.  Take if you go more than two days without a movement. Do not use MiraLax more than once per day. Call your doctor if you are still constipated or irregular after using this medication for 7 days in a row.  If you continue to have problems with postoperative constipation, please contact the office for further assistance and recommendations.  If you experience "the worst abdominal pain ever" or develop nausea or vomiting, please contact the office immediatly for further recommendations for treatment.  ITCHING  If you experience itching with your medications, try taking only a single pain pill, or even half a pain pill at a time.  You can also use Benadryl over the counter for itching or also to help with sleep.   TED HOSE STOCKINGS Wear the elastic stockings on both legs for three weeks following surgery during the day but you may remove then at night for sleeping.  MEDICATIONS See your medication summary on the "After Visit Summary" that the nursing staff will review  with you prior to discharge.  You may have some home medications which will be placed on hold until you complete the course of blood thinner medication.  It is important for you to complete the blood thinner medication as prescribed by your surgeon.  Continue your approved medications as instructed at time of discharge.  PRECAUTIONS If you experience chest pain or shortness of breath - call 911 immediately for transfer to the hospital emergency department.  If you develop a fever greater that 101 F, purulent drainage from wound, increased redness or drainage from wound, foul odor from the wound/dressing, or calf pain - CONTACT YOUR SURGEON.                                                   FOLLOW-UP APPOINTMENTS Make sure you keep all of your appointments after your operation with your surgeon and caregivers. You should call the office at the above phone number and make an appointment for approximately two weeks after the date of your surgery or on the date instructed by your surgeon outlined in the "After Visit Summary".   RANGE OF MOTION AND STRENGTHENING EXERCISES  Rehabilitation of the knee is important following a knee injury or an operation. After just a few days of immobilization, the muscles of the thigh which control the knee become weakened and shrink (atrophy). Knee exercises are designed to build up the tone and strength of the thigh muscles and to improve knee motion. Often times heat used for twenty to thirty minutes before working out  will loosen up your tissues and help with improving the range of motion but do not use heat for the first two weeks following surgery. These exercises can be done on a training (exercise) mat, on the floor, on a table or on a bed. Use what ever works the best and is most comfortable for you Knee exercises include:  Leg Lifts - While your knee is still immobilized in a splint or cast, you can do straight leg raises. Lift the leg to 60 degrees, hold for 3 sec,  and slowly lower the leg. Repeat 10-20 times 2-3 times daily. Perform this exercise against resistance later as your knee gets better.  Quad and Hamstring Sets - Tighten up the muscle on the front of the thigh (Quad) and hold for 5-10 sec. Repeat this 10-20 times hourly. Hamstring sets are done by pushing the foot backward against an object and holding for 5-10 sec. Repeat as with quad sets.  Leg Slides: Lying on your back, slowly slide your foot toward your buttocks, bending your knee up off the floor (only go as far as is comfortable). Then slowly slide your foot back down until your leg is flat on the floor again. Angel Wings: Lying on your back spread your legs to the side as far apart as you can without causing discomfort.  A rehabilitation program following serious knee injuries can speed recovery and prevent re-injury in the future due to weakened muscles. Contact your doctor or a physical therapist for more information on knee rehabilitation.   IF YOU ARE TRANSFERRED TO A SKILLED REHAB FACILITY If the patient is transferred to a skilled rehab facility following release from the hospital, a list of the current medications will be sent to the facility for the patient to continue.  When discharged from the skilled rehab facility, please have the facility set up the patient's [place] prior to being released. Also, the skilled facility will be responsible for providing the patient with their medications at time of release from the facility to include their pain medication, the muscle relaxants, and their blood thinner medication. If the patient is still at the rehab facility at time of the two week follow up appointment, the skilled rehab facility will also need to assist the patient in arranging follow up appointment in our office and any transportation needs.  MAKE SURE YOU:  Understand these instructions.  Get help right away if you are not doing well or get worse.    Pick up  stool softner and laxative for home use following surgery while on pain medications. Do not submerge incision under water. Please use good hand washing techniques while changing dressing each day. May shower starting three days after surgery. Please use a clean towel to pat the incision dry following showers. Continue to use ice for pain and swelling after surgery. Do not use any lotions or creams on the incision until instructed by your surgeon.   Do not put a pillow under the knee. Place it under the heel.   Complete by:  As directed    Driving restrictions   Complete by:  As directed    No driving for two weeks   TED hose   Complete by:  As directed    Use stockings (TED hose) for three weeks on both leg(s).  You may remove them at night for sleeping.   Weight bearing as tolerated   Complete by:  As directed      Allergies  as of 05/28/2018   No Known Allergies     Medication List    STOP taking these medications   aspirin 81 MG chewable tablet Replaced by:  aspirin 325 MG EC tablet   meloxicam 15 MG tablet Commonly known as:  MOBIC     TAKE these medications   acetaminophen 650 MG CR tablet Commonly known as:  TYLENOL Take 1,300 mg by mouth 2 (two) times daily.   albuterol 108 (90 Base) MCG/ACT inhaler Commonly known as:  PROVENTIL HFA;VENTOLIN HFA Inhale 2 puffs into the lungs every 6 (six) hours as needed for wheezing or shortness of breath.   amLODipine-benazepril 5-20 MG capsule Commonly known as:  LOTREL Take 1 capsule by mouth every morning.   aspirin 325 MG EC tablet Take 1 tablet (325 mg total) by mouth 2 (two) times daily for 19 days. Then resume one 81 mg aspirin once a day. Replaces:  aspirin 81 MG chewable tablet   furosemide 20 MG tablet Commonly known as:  LASIX Take 1 tablet (20 mg total) by mouth daily.   methocarbamol 500 MG tablet Commonly known as:  ROBAXIN Take 1 tablet (500 mg total) by mouth every 6 (six) hours as needed for muscle  spasms.   oxyCODONE 5 MG immediate release tablet Commonly known as:  Oxy IR/ROXICODONE Take 1-3 tablets (5-15 mg total) by mouth every 6 (six) hours as needed for moderate pain (pain score 4-6).   pantoprazole 40 MG tablet Commonly known as:  PROTONIX Take 40 mg by mouth daily.   sitaGLIPtin 100 MG tablet Commonly known as:  JANUVIA Take 100 mg by mouth daily.   SYNTHROID 125 MCG tablet Generic drug:  levothyroxine Take 125 mcg by mouth daily before breakfast.   traMADol 50 MG tablet Commonly known as:  ULTRAM Take 1-2 tablets (50-100 mg total) by mouth every 6 (six) hours as needed for moderate pain (if oxycodone not effective). What changed:  reasons to take this            Discharge Care Instructions  (From admission, onward)         Start     Ordered   05/28/18 0000  Weight bearing as tolerated     05/28/18 0750   05/28/18 0000  Change dressing    Comments:  Change the dressing daily with sterile 4 x 4 inch gauze dressing and apply TED hose.   05/28/18 0750         Follow-up Information    Gaynelle Arabian, MD. Schedule an appointment as soon as possible for a visit on 06/08/2018.   Specialty:  Orthopedic Surgery Contact information: [address] [place] [address] [phone]        [facility], [facility].   Specialty:  [place] Why:  physical therapy Contact information: [place] [address] [phone]           Signed: Theresa Duty, PA-C Orthopedic Surgery 05/31/2018, 9:12 AM

## 2018-06-01 DIAGNOSIS — M5030 Other cervical disc degeneration, unspecified cervical region: Secondary | ICD-10-CM | POA: Diagnosis not present

## 2018-06-01 DIAGNOSIS — E119 Type 2 diabetes mellitus without complications: Secondary | ICD-10-CM | POA: Diagnosis not present

## 2018-06-01 DIAGNOSIS — Z4733 Aftercare following explantation of knee joint prosthesis: Secondary | ICD-10-CM | POA: Diagnosis not present

## 2018-06-01 DIAGNOSIS — M1990 Unspecified osteoarthritis, unspecified site: Secondary | ICD-10-CM | POA: Diagnosis not present

## 2018-06-01 DIAGNOSIS — Z96651 Presence of right artificial knee joint: Secondary | ICD-10-CM | POA: Diagnosis not present

## 2018-06-01 DIAGNOSIS — D649 Anemia, unspecified: Secondary | ICD-10-CM | POA: Diagnosis not present

## 2018-06-02 DIAGNOSIS — M5030 Other cervical disc degeneration, unspecified cervical region: Secondary | ICD-10-CM | POA: Diagnosis not present

## 2018-06-02 DIAGNOSIS — Z4733 Aftercare following explantation of knee joint prosthesis: Secondary | ICD-10-CM | POA: Diagnosis not present

## 2018-06-02 DIAGNOSIS — E119 Type 2 diabetes mellitus without complications: Secondary | ICD-10-CM | POA: Diagnosis not present

## 2018-06-02 DIAGNOSIS — D649 Anemia, unspecified: Secondary | ICD-10-CM | POA: Diagnosis not present

## 2018-06-02 DIAGNOSIS — M1990 Unspecified osteoarthritis, unspecified site: Secondary | ICD-10-CM | POA: Diagnosis not present

## 2018-06-02 DIAGNOSIS — Z96651 Presence of right artificial knee joint: Secondary | ICD-10-CM | POA: Diagnosis not present

## 2018-06-03 DIAGNOSIS — D649 Anemia, unspecified: Secondary | ICD-10-CM | POA: Diagnosis not present

## 2018-06-03 DIAGNOSIS — Z4733 Aftercare following explantation of knee joint prosthesis: Secondary | ICD-10-CM | POA: Diagnosis not present

## 2018-06-03 DIAGNOSIS — M1990 Unspecified osteoarthritis, unspecified site: Secondary | ICD-10-CM | POA: Diagnosis not present

## 2018-06-03 DIAGNOSIS — M5030 Other cervical disc degeneration, unspecified cervical region: Secondary | ICD-10-CM | POA: Diagnosis not present

## 2018-06-03 DIAGNOSIS — Z96651 Presence of right artificial knee joint: Secondary | ICD-10-CM | POA: Diagnosis not present

## 2018-06-03 DIAGNOSIS — E119 Type 2 diabetes mellitus without complications: Secondary | ICD-10-CM | POA: Diagnosis not present

## 2018-06-07 DIAGNOSIS — M1990 Unspecified osteoarthritis, unspecified site: Secondary | ICD-10-CM | POA: Diagnosis not present

## 2018-06-07 DIAGNOSIS — Z96651 Presence of right artificial knee joint: Secondary | ICD-10-CM | POA: Diagnosis not present

## 2018-06-07 DIAGNOSIS — Z4733 Aftercare following explantation of knee joint prosthesis: Secondary | ICD-10-CM | POA: Diagnosis not present

## 2018-06-07 DIAGNOSIS — E119 Type 2 diabetes mellitus without complications: Secondary | ICD-10-CM | POA: Diagnosis not present

## 2018-06-07 DIAGNOSIS — D649 Anemia, unspecified: Secondary | ICD-10-CM | POA: Diagnosis not present

## 2018-06-07 DIAGNOSIS — M5030 Other cervical disc degeneration, unspecified cervical region: Secondary | ICD-10-CM | POA: Diagnosis not present

## 2018-06-09 DIAGNOSIS — Z96651 Presence of right artificial knee joint: Secondary | ICD-10-CM | POA: Diagnosis not present

## 2018-06-09 DIAGNOSIS — M5030 Other cervical disc degeneration, unspecified cervical region: Secondary | ICD-10-CM | POA: Diagnosis not present

## 2018-06-09 DIAGNOSIS — Z4733 Aftercare following explantation of knee joint prosthesis: Secondary | ICD-10-CM | POA: Diagnosis not present

## 2018-06-09 DIAGNOSIS — D649 Anemia, unspecified: Secondary | ICD-10-CM | POA: Diagnosis not present

## 2018-06-09 DIAGNOSIS — M1990 Unspecified osteoarthritis, unspecified site: Secondary | ICD-10-CM | POA: Diagnosis not present

## 2018-06-09 DIAGNOSIS — E119 Type 2 diabetes mellitus without complications: Secondary | ICD-10-CM | POA: Diagnosis not present

## 2018-06-10 ENCOUNTER — Other Ambulatory Visit: Payer: Self-pay | Admitting: Internal Medicine

## 2018-06-29 DIAGNOSIS — Z96651 Presence of right artificial knee joint: Secondary | ICD-10-CM | POA: Diagnosis not present

## 2018-06-29 DIAGNOSIS — M1711 Unilateral primary osteoarthritis, right knee: Secondary | ICD-10-CM | POA: Diagnosis not present

## 2018-06-29 DIAGNOSIS — T8453XD Infection and inflammatory reaction due to internal right knee prosthesis, subsequent encounter: Secondary | ICD-10-CM | POA: Diagnosis not present

## 2018-06-29 DIAGNOSIS — Z471 Aftercare following joint replacement surgery: Secondary | ICD-10-CM | POA: Diagnosis not present

## 2018-07-22 ENCOUNTER — Encounter (HOSPITAL_COMMUNITY): Payer: Self-pay

## 2018-07-22 ENCOUNTER — Encounter (HOSPITAL_COMMUNITY): Payer: Medicare Other

## 2018-07-22 ENCOUNTER — Ambulatory Visit (HOSPITAL_COMMUNITY): Payer: Medicare Other

## 2018-07-26 ENCOUNTER — Other Ambulatory Visit (HOSPITAL_COMMUNITY): Payer: Self-pay | Admitting: Otolaryngology

## 2018-07-26 DIAGNOSIS — R1310 Dysphagia, unspecified: Secondary | ICD-10-CM

## 2018-08-10 ENCOUNTER — Ambulatory Visit (HOSPITAL_COMMUNITY)
Admission: RE | Admit: 2018-08-10 | Discharge: 2018-08-10 | Disposition: A | Discharge status: Home / Self Care | Payer: Medicare Other | Source: Ambulatory Visit | Attending: Otolaryngology | Admitting: Otolaryngology

## 2018-08-10 DIAGNOSIS — R1310 Dysphagia, unspecified: Secondary | ICD-10-CM | POA: Insufficient documentation

## 2018-08-10 DIAGNOSIS — R070 Pain in throat: Secondary | ICD-10-CM | POA: Diagnosis not present

## 2018-08-17 DIAGNOSIS — Z6836 Body mass index (BMI) 36.0-36.9, adult: Secondary | ICD-10-CM | POA: Diagnosis not present

## 2018-08-17 DIAGNOSIS — M47816 Spondylosis without myelopathy or radiculopathy, lumbar region: Secondary | ICD-10-CM | POA: Diagnosis not present

## 2018-09-20 DIAGNOSIS — Z7984 Long term (current) use of oral hypoglycemic drugs: Secondary | ICD-10-CM | POA: Diagnosis not present

## 2018-09-20 DIAGNOSIS — E039 Hypothyroidism, unspecified: Secondary | ICD-10-CM | POA: Diagnosis not present

## 2018-09-20 DIAGNOSIS — E1169 Type 2 diabetes mellitus with other specified complication: Secondary | ICD-10-CM | POA: Diagnosis not present

## 2018-09-20 DIAGNOSIS — I10 Essential (primary) hypertension: Secondary | ICD-10-CM | POA: Diagnosis not present

## 2018-09-20 DIAGNOSIS — E785 Hyperlipidemia, unspecified: Secondary | ICD-10-CM | POA: Diagnosis not present

## 2018-09-20 DIAGNOSIS — K219 Gastro-esophageal reflux disease without esophagitis: Secondary | ICD-10-CM | POA: Diagnosis not present

## 2018-11-11 DIAGNOSIS — Z96651 Presence of right artificial knee joint: Secondary | ICD-10-CM | POA: Diagnosis not present

## 2018-11-11 DIAGNOSIS — Z471 Aftercare following joint replacement surgery: Secondary | ICD-10-CM | POA: Diagnosis not present

## 2018-11-22 DIAGNOSIS — M47816 Spondylosis without myelopathy or radiculopathy, lumbar region: Secondary | ICD-10-CM | POA: Diagnosis not present

## 2018-11-22 DIAGNOSIS — Z6836 Body mass index (BMI) 36.0-36.9, adult: Secondary | ICD-10-CM | POA: Diagnosis not present

## 2018-11-24 DIAGNOSIS — I10 Essential (primary) hypertension: Secondary | ICD-10-CM | POA: Diagnosis not present

## 2018-12-03 DIAGNOSIS — M5416 Radiculopathy, lumbar region: Secondary | ICD-10-CM | POA: Diagnosis not present

## 2018-12-20 DIAGNOSIS — E1169 Type 2 diabetes mellitus with other specified complication: Secondary | ICD-10-CM | POA: Diagnosis not present

## 2018-12-20 DIAGNOSIS — Z0000 Encounter for general adult medical examination without abnormal findings: Secondary | ICD-10-CM | POA: Diagnosis not present

## 2018-12-20 DIAGNOSIS — Z7984 Long term (current) use of oral hypoglycemic drugs: Secondary | ICD-10-CM | POA: Diagnosis not present

## 2018-12-20 DIAGNOSIS — K219 Gastro-esophageal reflux disease without esophagitis: Secondary | ICD-10-CM | POA: Diagnosis not present

## 2018-12-20 DIAGNOSIS — Z1389 Encounter for screening for other disorder: Secondary | ICD-10-CM | POA: Diagnosis not present

## 2018-12-20 DIAGNOSIS — I10 Essential (primary) hypertension: Secondary | ICD-10-CM | POA: Diagnosis not present

## 2018-12-20 DIAGNOSIS — G4700 Insomnia, unspecified: Secondary | ICD-10-CM | POA: Diagnosis not present

## 2018-12-20 DIAGNOSIS — E039 Hypothyroidism, unspecified: Secondary | ICD-10-CM | POA: Diagnosis not present

## 2019-02-05 ENCOUNTER — Other Ambulatory Visit: Payer: Self-pay | Admitting: Internal Medicine

## 2019-02-07 ENCOUNTER — Other Ambulatory Visit: Payer: Self-pay | Admitting: Internal Medicine

## 2019-02-14 DIAGNOSIS — M47817 Spondylosis without myelopathy or radiculopathy, lumbosacral region: Secondary | ICD-10-CM | POA: Diagnosis not present

## 2019-02-14 DIAGNOSIS — M4125 Other idiopathic scoliosis, thoracolumbar region: Secondary | ICD-10-CM | POA: Diagnosis not present

## 2019-02-14 DIAGNOSIS — G894 Chronic pain syndrome: Secondary | ICD-10-CM | POA: Diagnosis not present

## 2019-02-14 DIAGNOSIS — M25561 Pain in right knee: Secondary | ICD-10-CM | POA: Diagnosis not present

## 2019-03-28 DIAGNOSIS — N390 Urinary tract infection, site not specified: Secondary | ICD-10-CM | POA: Diagnosis not present

## 2019-03-28 DIAGNOSIS — M4125 Other idiopathic scoliosis, thoracolumbar region: Secondary | ICD-10-CM | POA: Diagnosis not present

## 2019-03-28 DIAGNOSIS — G894 Chronic pain syndrome: Secondary | ICD-10-CM | POA: Diagnosis not present

## 2019-03-28 DIAGNOSIS — M25561 Pain in right knee: Secondary | ICD-10-CM | POA: Diagnosis not present

## 2019-03-28 DIAGNOSIS — M47817 Spondylosis without myelopathy or radiculopathy, lumbosacral region: Secondary | ICD-10-CM | POA: Diagnosis not present

## 2019-04-01 DIAGNOSIS — E119 Type 2 diabetes mellitus without complications: Secondary | ICD-10-CM | POA: Diagnosis not present

## 2019-04-01 DIAGNOSIS — N362 Urethral caruncle: Secondary | ICD-10-CM | POA: Diagnosis not present

## 2019-04-01 DIAGNOSIS — Z87440 Personal history of urinary (tract) infections: Secondary | ICD-10-CM | POA: Diagnosis not present

## 2019-04-01 DIAGNOSIS — N952 Postmenopausal atrophic vaginitis: Secondary | ICD-10-CM | POA: Diagnosis not present

## 2019-04-01 DIAGNOSIS — N3000 Acute cystitis without hematuria: Secondary | ICD-10-CM | POA: Diagnosis not present

## 2019-04-26 ENCOUNTER — Other Ambulatory Visit: Payer: Medicare Other

## 2019-05-06 ENCOUNTER — Other Ambulatory Visit: Payer: Medicare Other

## 2019-05-25 DIAGNOSIS — Z23 Encounter for immunization: Secondary | ICD-10-CM | POA: Diagnosis not present

## 2019-05-27 ENCOUNTER — Ambulatory Visit
Admission: RE | Admit: 2019-05-27 | Discharge: 2019-05-27 | Disposition: A | Discharge status: Home / Self Care | Payer: Medicare Other | Source: Ambulatory Visit | Attending: Internal Medicine | Admitting: Internal Medicine

## 2019-05-27 ENCOUNTER — Other Ambulatory Visit: Payer: Self-pay

## 2019-05-27 DIAGNOSIS — M47817 Spondylosis without myelopathy or radiculopathy, lumbosacral region: Secondary | ICD-10-CM | POA: Diagnosis not present

## 2019-05-27 DIAGNOSIS — M25561 Pain in right knee: Secondary | ICD-10-CM | POA: Diagnosis not present

## 2019-05-27 DIAGNOSIS — R918 Other nonspecific abnormal finding of lung field: Secondary | ICD-10-CM | POA: Diagnosis not present

## 2019-05-27 DIAGNOSIS — G894 Chronic pain syndrome: Secondary | ICD-10-CM | POA: Diagnosis not present

## 2019-05-27 DIAGNOSIS — M4125 Other idiopathic scoliosis, thoracolumbar region: Secondary | ICD-10-CM | POA: Diagnosis not present

## 2019-06-02 ENCOUNTER — Telehealth: Payer: Self-pay | Admitting: Internal Medicine

## 2019-06-02 NOTE — Telephone Encounter (Signed)
Please let Sheri [name] know that noduels are stable compared to last year aug 2019 and no further followup for it is recommended     IMPRESSION:CT chest 1. No change in the appearance of multiple small bilateral pulmonary nodules. These are compatible with a benign process and no further follow-up is indicated at this time. This recommendation follows the consensus statement: Guidelines for Management of Small Pulmonary Nodules Detected on CT Images: From the Fleischner Society 2017; Radiology 2017; 284:228-243. 2.  Aortic Atherosclerosis (ICD10-I70.0). 3. Coronary artery calcifications.   Electronically Signed   By: Kerby Moors M.D.   On: 05/28/2019 12:50

## 2019-06-03 NOTE — Telephone Encounter (Signed)
Called and spoke to patient. Relayed results per Dr. [name].  Patient verbalized understanding and thanked staff for calling with good news. Nothing further needed at this time.

## 2019-06-17 DIAGNOSIS — Z96651 Presence of right artificial knee joint: Secondary | ICD-10-CM | POA: Diagnosis not present

## 2019-06-17 DIAGNOSIS — Z471 Aftercare following joint replacement surgery: Secondary | ICD-10-CM | POA: Diagnosis not present

## 2019-07-04 DIAGNOSIS — I10 Essential (primary) hypertension: Secondary | ICD-10-CM | POA: Diagnosis not present

## 2019-07-04 DIAGNOSIS — G4700 Insomnia, unspecified: Secondary | ICD-10-CM | POA: Diagnosis not present

## 2019-07-04 DIAGNOSIS — E785 Hyperlipidemia, unspecified: Secondary | ICD-10-CM | POA: Diagnosis not present

## 2019-07-04 DIAGNOSIS — N3000 Acute cystitis without hematuria: Secondary | ICD-10-CM | POA: Diagnosis not present

## 2019-07-04 DIAGNOSIS — E039 Hypothyroidism, unspecified: Secondary | ICD-10-CM | POA: Diagnosis not present

## 2019-07-04 DIAGNOSIS — E1169 Type 2 diabetes mellitus with other specified complication: Secondary | ICD-10-CM | POA: Diagnosis not present

## 2019-07-04 DIAGNOSIS — K219 Gastro-esophageal reflux disease without esophagitis: Secondary | ICD-10-CM | POA: Diagnosis not present

## 2019-07-07 ENCOUNTER — Other Ambulatory Visit: Payer: Self-pay

## 2019-07-11 DIAGNOSIS — Z6834 Body mass index (BMI) 34.0-34.9, adult: Secondary | ICD-10-CM | POA: Diagnosis not present

## 2019-07-11 DIAGNOSIS — B373 Candidiasis of vulva and vagina: Secondary | ICD-10-CM | POA: Diagnosis not present

## 2019-07-11 DIAGNOSIS — N390 Urinary tract infection, site not specified: Secondary | ICD-10-CM | POA: Diagnosis not present

## 2019-07-22 DIAGNOSIS — E785 Hyperlipidemia, unspecified: Secondary | ICD-10-CM | POA: Diagnosis not present

## 2019-07-22 DIAGNOSIS — E1169 Type 2 diabetes mellitus with other specified complication: Secondary | ICD-10-CM | POA: Diagnosis not present

## 2019-08-15 DIAGNOSIS — N3000 Acute cystitis without hematuria: Secondary | ICD-10-CM | POA: Diagnosis not present

## 2019-08-15 DIAGNOSIS — R339 Retention of urine, unspecified: Secondary | ICD-10-CM | POA: Diagnosis not present

## 2019-08-15 DIAGNOSIS — Z6836 Body mass index (BMI) 36.0-36.9, adult: Secondary | ICD-10-CM | POA: Diagnosis not present

## 2019-11-23 DIAGNOSIS — N390 Urinary tract infection, site not specified: Secondary | ICD-10-CM | POA: Diagnosis not present

## 2019-12-12 ENCOUNTER — Other Ambulatory Visit: Payer: Self-pay | Admitting: Internal Medicine

## 2019-12-22 DIAGNOSIS — N952 Postmenopausal atrophic vaginitis: Secondary | ICD-10-CM | POA: Diagnosis not present

## 2019-12-22 DIAGNOSIS — N390 Urinary tract infection, site not specified: Secondary | ICD-10-CM | POA: Diagnosis not present

## 2019-12-22 DIAGNOSIS — Z6836 Body mass index (BMI) 36.0-36.9, adult: Secondary | ICD-10-CM | POA: Diagnosis not present

## 2019-12-27 DIAGNOSIS — E1169 Type 2 diabetes mellitus with other specified complication: Secondary | ICD-10-CM | POA: Diagnosis not present

## 2019-12-27 DIAGNOSIS — Z1389 Encounter for screening for other disorder: Secondary | ICD-10-CM | POA: Diagnosis not present

## 2019-12-27 DIAGNOSIS — I10 Essential (primary) hypertension: Secondary | ICD-10-CM | POA: Diagnosis not present

## 2019-12-27 DIAGNOSIS — M1990 Unspecified osteoarthritis, unspecified site: Secondary | ICD-10-CM | POA: Diagnosis not present

## 2019-12-27 DIAGNOSIS — E785 Hyperlipidemia, unspecified: Secondary | ICD-10-CM | POA: Diagnosis not present

## 2019-12-27 DIAGNOSIS — E039 Hypothyroidism, unspecified: Secondary | ICD-10-CM | POA: Diagnosis not present

## 2019-12-27 DIAGNOSIS — K219 Gastro-esophageal reflux disease without esophagitis: Secondary | ICD-10-CM | POA: Diagnosis not present

## 2019-12-27 DIAGNOSIS — Z0000 Encounter for general adult medical examination without abnormal findings: Secondary | ICD-10-CM | POA: Diagnosis not present

## 2019-12-27 DIAGNOSIS — E669 Obesity, unspecified: Secondary | ICD-10-CM | POA: Diagnosis not present

## 2019-12-27 DIAGNOSIS — G4700 Insomnia, unspecified: Secondary | ICD-10-CM | POA: Diagnosis not present

## 2020-03-22 DIAGNOSIS — E785 Hyperlipidemia, unspecified: Secondary | ICD-10-CM | POA: Diagnosis not present

## 2020-03-22 DIAGNOSIS — E039 Hypothyroidism, unspecified: Secondary | ICD-10-CM | POA: Diagnosis not present

## 2020-03-22 DIAGNOSIS — M1990 Unspecified osteoarthritis, unspecified site: Secondary | ICD-10-CM | POA: Diagnosis not present

## 2020-03-22 DIAGNOSIS — E1169 Type 2 diabetes mellitus with other specified complication: Secondary | ICD-10-CM | POA: Diagnosis not present

## 2020-03-22 DIAGNOSIS — Z7984 Long term (current) use of oral hypoglycemic drugs: Secondary | ICD-10-CM | POA: Diagnosis not present

## 2020-03-22 DIAGNOSIS — I10 Essential (primary) hypertension: Secondary | ICD-10-CM | POA: Diagnosis not present

## 2020-03-23 DIAGNOSIS — M4125 Other idiopathic scoliosis, thoracolumbar region: Secondary | ICD-10-CM | POA: Diagnosis not present

## 2020-03-23 DIAGNOSIS — M25561 Pain in right knee: Secondary | ICD-10-CM | POA: Diagnosis not present

## 2020-03-23 DIAGNOSIS — G894 Chronic pain syndrome: Secondary | ICD-10-CM | POA: Diagnosis not present

## 2020-03-23 DIAGNOSIS — M47817 Spondylosis without myelopathy or radiculopathy, lumbosacral region: Secondary | ICD-10-CM | POA: Diagnosis not present

## 2020-05-28 DIAGNOSIS — Z23 Encounter for immunization: Secondary | ICD-10-CM | POA: Diagnosis not present

## 2020-06-13 DIAGNOSIS — Z23 Encounter for immunization: Secondary | ICD-10-CM | POA: Diagnosis not present

## 2020-06-13 DIAGNOSIS — G894 Chronic pain syndrome: Secondary | ICD-10-CM | POA: Diagnosis not present

## 2020-06-13 DIAGNOSIS — M4125 Other idiopathic scoliosis, thoracolumbar region: Secondary | ICD-10-CM | POA: Diagnosis not present

## 2020-06-13 DIAGNOSIS — M47817 Spondylosis without myelopathy or radiculopathy, lumbosacral region: Secondary | ICD-10-CM | POA: Diagnosis not present

## 2020-06-13 DIAGNOSIS — M25561 Pain in right knee: Secondary | ICD-10-CM | POA: Diagnosis not present

## 2020-06-27 DIAGNOSIS — K219 Gastro-esophageal reflux disease without esophagitis: Secondary | ICD-10-CM | POA: Diagnosis not present

## 2020-06-27 DIAGNOSIS — E1169 Type 2 diabetes mellitus with other specified complication: Secondary | ICD-10-CM | POA: Diagnosis not present

## 2020-06-27 DIAGNOSIS — E785 Hyperlipidemia, unspecified: Secondary | ICD-10-CM | POA: Diagnosis not present

## 2020-06-27 DIAGNOSIS — Z7984 Long term (current) use of oral hypoglycemic drugs: Secondary | ICD-10-CM | POA: Diagnosis not present

## 2020-06-27 DIAGNOSIS — E039 Hypothyroidism, unspecified: Secondary | ICD-10-CM | POA: Diagnosis not present

## 2020-06-27 DIAGNOSIS — I10 Essential (primary) hypertension: Secondary | ICD-10-CM | POA: Diagnosis not present

## 2020-06-27 DIAGNOSIS — G4700 Insomnia, unspecified: Secondary | ICD-10-CM | POA: Diagnosis not present

## 2020-07-02 HISTORY — PX: OTHER SURGICAL HISTORY: SHX169

## 2020-07-04 DIAGNOSIS — R0789 Other chest pain: Secondary | ICD-10-CM | POA: Diagnosis not present

## 2020-07-04 DIAGNOSIS — I208 Other forms of angina pectoris: Secondary | ICD-10-CM | POA: Diagnosis not present

## 2020-07-04 DIAGNOSIS — R079 Chest pain, unspecified: Secondary | ICD-10-CM | POA: Insufficient documentation

## 2020-07-04 DIAGNOSIS — Z4682 Encounter for fitting and adjustment of non-vascular catheter: Secondary | ICD-10-CM | POA: Diagnosis not present

## 2020-07-04 DIAGNOSIS — K219 Gastro-esophageal reflux disease without esophagitis: Secondary | ICD-10-CM | POA: Diagnosis present

## 2020-07-04 DIAGNOSIS — Z87440 Personal history of urinary (tract) infections: Secondary | ICD-10-CM | POA: Diagnosis not present

## 2020-07-04 DIAGNOSIS — Z8261 Family history of arthritis: Secondary | ICD-10-CM | POA: Diagnosis not present

## 2020-07-04 DIAGNOSIS — Z79899 Other long term (current) drug therapy: Secondary | ICD-10-CM | POA: Diagnosis not present

## 2020-07-04 DIAGNOSIS — I25118 Atherosclerotic heart disease of native coronary artery with other forms of angina pectoris: Secondary | ICD-10-CM | POA: Diagnosis not present

## 2020-07-04 DIAGNOSIS — I051 Rheumatic mitral insufficiency: Secondary | ICD-10-CM | POA: Diagnosis not present

## 2020-07-04 DIAGNOSIS — I200 Unstable angina: Secondary | ICD-10-CM | POA: Diagnosis not present

## 2020-07-04 DIAGNOSIS — T8453XA Infection and inflammatory reaction due to internal right knee prosthesis, initial encounter: Secondary | ICD-10-CM | POA: Diagnosis not present

## 2020-07-04 DIAGNOSIS — I2510 Atherosclerotic heart disease of native coronary artery without angina pectoris: Secondary | ICD-10-CM | POA: Diagnosis not present

## 2020-07-04 DIAGNOSIS — Z743 Need for continuous supervision: Secondary | ICD-10-CM | POA: Diagnosis not present

## 2020-07-04 DIAGNOSIS — G8918 Other acute postprocedural pain: Secondary | ICD-10-CM | POA: Diagnosis not present

## 2020-07-04 DIAGNOSIS — I10 Essential (primary) hypertension: Secondary | ICD-10-CM | POA: Diagnosis not present

## 2020-07-04 DIAGNOSIS — J9811 Atelectasis: Secondary | ICD-10-CM | POA: Diagnosis not present

## 2020-07-04 DIAGNOSIS — Z7982 Long term (current) use of aspirin: Secondary | ICD-10-CM | POA: Diagnosis not present

## 2020-07-04 DIAGNOSIS — E669 Obesity, unspecified: Secondary | ICD-10-CM | POA: Diagnosis present

## 2020-07-04 DIAGNOSIS — I480 Paroxysmal atrial fibrillation: Secondary | ICD-10-CM | POA: Diagnosis not present

## 2020-07-04 DIAGNOSIS — E7800 Pure hypercholesterolemia, unspecified: Secondary | ICD-10-CM | POA: Diagnosis not present

## 2020-07-04 DIAGNOSIS — E119 Type 2 diabetes mellitus without complications: Secondary | ICD-10-CM | POA: Diagnosis not present

## 2020-07-04 DIAGNOSIS — Z96651 Presence of right artificial knee joint: Secondary | ICD-10-CM | POA: Diagnosis present

## 2020-07-04 DIAGNOSIS — Z452 Encounter for adjustment and management of vascular access device: Secondary | ICD-10-CM | POA: Diagnosis not present

## 2020-07-04 DIAGNOSIS — Z20822 Contact with and (suspected) exposure to covid-19: Secondary | ICD-10-CM | POA: Diagnosis present

## 2020-07-04 DIAGNOSIS — I495 Sick sinus syndrome: Secondary | ICD-10-CM | POA: Diagnosis not present

## 2020-07-04 DIAGNOSIS — Z4889 Encounter for other specified surgical aftercare: Secondary | ICD-10-CM | POA: Diagnosis not present

## 2020-07-04 DIAGNOSIS — E039 Hypothyroidism, unspecified: Secondary | ICD-10-CM | POA: Diagnosis not present

## 2020-07-04 DIAGNOSIS — I214 Non-ST elevation (NSTEMI) myocardial infarction: Secondary | ICD-10-CM | POA: Diagnosis not present

## 2020-07-04 DIAGNOSIS — Z01810 Encounter for preprocedural cardiovascular examination: Secondary | ICD-10-CM | POA: Diagnosis not present

## 2020-07-04 DIAGNOSIS — R918 Other nonspecific abnormal finding of lung field: Secondary | ICD-10-CM | POA: Diagnosis not present

## 2020-07-04 DIAGNOSIS — Z48812 Encounter for surgical aftercare following surgery on the circulatory system: Secondary | ICD-10-CM | POA: Diagnosis not present

## 2020-07-04 DIAGNOSIS — E785 Hyperlipidemia, unspecified: Secondary | ICD-10-CM | POA: Diagnosis not present

## 2020-07-04 DIAGNOSIS — I25119 Atherosclerotic heart disease of native coronary artery with unspecified angina pectoris: Secondary | ICD-10-CM | POA: Diagnosis not present

## 2020-07-04 DIAGNOSIS — R072 Precordial pain: Secondary | ICD-10-CM | POA: Diagnosis not present

## 2020-07-04 DIAGNOSIS — I25110 Atherosclerotic heart disease of native coronary artery with unstable angina pectoris: Secondary | ICD-10-CM | POA: Diagnosis not present

## 2020-07-04 DIAGNOSIS — R7989 Other specified abnormal findings of blood chemistry: Secondary | ICD-10-CM | POA: Diagnosis not present

## 2020-07-04 DIAGNOSIS — D62 Acute posthemorrhagic anemia: Secondary | ICD-10-CM | POA: Diagnosis not present

## 2020-07-04 DIAGNOSIS — I160 Hypertensive urgency: Secondary | ICD-10-CM | POA: Diagnosis not present

## 2020-07-10 DIAGNOSIS — I2510 Atherosclerotic heart disease of native coronary artery without angina pectoris: Secondary | ICD-10-CM | POA: Insufficient documentation

## 2020-07-14 DIAGNOSIS — I480 Paroxysmal atrial fibrillation: Secondary | ICD-10-CM | POA: Insufficient documentation

## 2020-07-17 DIAGNOSIS — E119 Type 2 diabetes mellitus without complications: Secondary | ICD-10-CM | POA: Diagnosis not present

## 2020-07-17 DIAGNOSIS — I10 Essential (primary) hypertension: Secondary | ICD-10-CM | POA: Diagnosis not present

## 2020-07-17 DIAGNOSIS — Z7982 Long term (current) use of aspirin: Secondary | ICD-10-CM | POA: Diagnosis not present

## 2020-07-17 DIAGNOSIS — Z79899 Other long term (current) drug therapy: Secondary | ICD-10-CM | POA: Diagnosis not present

## 2020-07-17 DIAGNOSIS — I480 Paroxysmal atrial fibrillation: Secondary | ICD-10-CM | POA: Diagnosis not present

## 2020-07-17 DIAGNOSIS — I214 Non-ST elevation (NSTEMI) myocardial infarction: Secondary | ICD-10-CM | POA: Diagnosis not present

## 2020-07-17 DIAGNOSIS — E785 Hyperlipidemia, unspecified: Secondary | ICD-10-CM | POA: Diagnosis not present

## 2020-07-17 DIAGNOSIS — I2510 Atherosclerotic heart disease of native coronary artery without angina pectoris: Secondary | ICD-10-CM | POA: Diagnosis not present

## 2020-07-17 DIAGNOSIS — K219 Gastro-esophageal reflux disease without esophagitis: Secondary | ICD-10-CM | POA: Diagnosis not present

## 2020-07-17 DIAGNOSIS — E039 Hypothyroidism, unspecified: Secondary | ICD-10-CM | POA: Diagnosis not present

## 2020-07-17 DIAGNOSIS — Z48812 Encounter for surgical aftercare following surgery on the circulatory system: Secondary | ICD-10-CM | POA: Diagnosis not present

## 2020-07-18 DIAGNOSIS — E785 Hyperlipidemia, unspecified: Secondary | ICD-10-CM | POA: Diagnosis not present

## 2020-07-18 DIAGNOSIS — I2510 Atherosclerotic heart disease of native coronary artery without angina pectoris: Secondary | ICD-10-CM | POA: Diagnosis not present

## 2020-07-18 DIAGNOSIS — I480 Paroxysmal atrial fibrillation: Secondary | ICD-10-CM | POA: Diagnosis not present

## 2020-07-18 DIAGNOSIS — I214 Non-ST elevation (NSTEMI) myocardial infarction: Secondary | ICD-10-CM | POA: Diagnosis not present

## 2020-07-18 DIAGNOSIS — Z48812 Encounter for surgical aftercare following surgery on the circulatory system: Secondary | ICD-10-CM | POA: Diagnosis not present

## 2020-07-18 DIAGNOSIS — I10 Essential (primary) hypertension: Secondary | ICD-10-CM | POA: Diagnosis not present

## 2020-07-20 DIAGNOSIS — I214 Non-ST elevation (NSTEMI) myocardial infarction: Secondary | ICD-10-CM | POA: Diagnosis not present

## 2020-07-20 DIAGNOSIS — I10 Essential (primary) hypertension: Secondary | ICD-10-CM | POA: Diagnosis not present

## 2020-07-20 DIAGNOSIS — I2510 Atherosclerotic heart disease of native coronary artery without angina pectoris: Secondary | ICD-10-CM | POA: Diagnosis not present

## 2020-07-20 DIAGNOSIS — I480 Paroxysmal atrial fibrillation: Secondary | ICD-10-CM | POA: Diagnosis not present

## 2020-07-20 DIAGNOSIS — E785 Hyperlipidemia, unspecified: Secondary | ICD-10-CM | POA: Diagnosis not present

## 2020-07-20 DIAGNOSIS — Z48812 Encounter for surgical aftercare following surgery on the circulatory system: Secondary | ICD-10-CM | POA: Diagnosis not present

## 2020-07-23 DIAGNOSIS — I10 Essential (primary) hypertension: Secondary | ICD-10-CM | POA: Diagnosis not present

## 2020-07-23 DIAGNOSIS — I2510 Atherosclerotic heart disease of native coronary artery without angina pectoris: Secondary | ICD-10-CM | POA: Diagnosis not present

## 2020-07-23 DIAGNOSIS — I480 Paroxysmal atrial fibrillation: Secondary | ICD-10-CM | POA: Diagnosis not present

## 2020-07-23 DIAGNOSIS — E785 Hyperlipidemia, unspecified: Secondary | ICD-10-CM | POA: Diagnosis not present

## 2020-07-23 DIAGNOSIS — Z48812 Encounter for surgical aftercare following surgery on the circulatory system: Secondary | ICD-10-CM | POA: Diagnosis not present

## 2020-07-23 DIAGNOSIS — I214 Non-ST elevation (NSTEMI) myocardial infarction: Secondary | ICD-10-CM | POA: Diagnosis not present

## 2020-07-24 DIAGNOSIS — E785 Hyperlipidemia, unspecified: Secondary | ICD-10-CM | POA: Diagnosis not present

## 2020-07-24 DIAGNOSIS — Z48812 Encounter for surgical aftercare following surgery on the circulatory system: Secondary | ICD-10-CM | POA: Diagnosis not present

## 2020-07-24 DIAGNOSIS — I10 Essential (primary) hypertension: Secondary | ICD-10-CM | POA: Diagnosis not present

## 2020-07-24 DIAGNOSIS — I480 Paroxysmal atrial fibrillation: Secondary | ICD-10-CM | POA: Diagnosis not present

## 2020-07-24 DIAGNOSIS — I214 Non-ST elevation (NSTEMI) myocardial infarction: Secondary | ICD-10-CM | POA: Diagnosis not present

## 2020-07-24 DIAGNOSIS — I2510 Atherosclerotic heart disease of native coronary artery without angina pectoris: Secondary | ICD-10-CM | POA: Diagnosis not present

## 2020-07-25 DIAGNOSIS — E785 Hyperlipidemia, unspecified: Secondary | ICD-10-CM | POA: Diagnosis not present

## 2020-07-25 DIAGNOSIS — I2510 Atherosclerotic heart disease of native coronary artery without angina pectoris: Secondary | ICD-10-CM | POA: Diagnosis not present

## 2020-07-25 DIAGNOSIS — I10 Essential (primary) hypertension: Secondary | ICD-10-CM | POA: Diagnosis not present

## 2020-07-25 DIAGNOSIS — I480 Paroxysmal atrial fibrillation: Secondary | ICD-10-CM | POA: Diagnosis not present

## 2020-07-25 DIAGNOSIS — Z48812 Encounter for surgical aftercare following surgery on the circulatory system: Secondary | ICD-10-CM | POA: Diagnosis not present

## 2020-07-25 DIAGNOSIS — I214 Non-ST elevation (NSTEMI) myocardial infarction: Secondary | ICD-10-CM | POA: Diagnosis not present

## 2020-07-27 DIAGNOSIS — I214 Non-ST elevation (NSTEMI) myocardial infarction: Secondary | ICD-10-CM | POA: Diagnosis not present

## 2020-07-27 DIAGNOSIS — I10 Essential (primary) hypertension: Secondary | ICD-10-CM | POA: Diagnosis not present

## 2020-07-27 DIAGNOSIS — I2510 Atherosclerotic heart disease of native coronary artery without angina pectoris: Secondary | ICD-10-CM | POA: Diagnosis not present

## 2020-07-27 DIAGNOSIS — E785 Hyperlipidemia, unspecified: Secondary | ICD-10-CM | POA: Diagnosis not present

## 2020-07-27 DIAGNOSIS — I480 Paroxysmal atrial fibrillation: Secondary | ICD-10-CM | POA: Diagnosis not present

## 2020-07-27 DIAGNOSIS — Z48812 Encounter for surgical aftercare following surgery on the circulatory system: Secondary | ICD-10-CM | POA: Diagnosis not present

## 2020-07-31 DIAGNOSIS — I214 Non-ST elevation (NSTEMI) myocardial infarction: Secondary | ICD-10-CM | POA: Diagnosis not present

## 2020-07-31 DIAGNOSIS — I480 Paroxysmal atrial fibrillation: Secondary | ICD-10-CM | POA: Diagnosis not present

## 2020-07-31 DIAGNOSIS — I2510 Atherosclerotic heart disease of native coronary artery without angina pectoris: Secondary | ICD-10-CM | POA: Diagnosis not present

## 2020-07-31 DIAGNOSIS — I10 Essential (primary) hypertension: Secondary | ICD-10-CM | POA: Diagnosis not present

## 2020-07-31 DIAGNOSIS — Z48812 Encounter for surgical aftercare following surgery on the circulatory system: Secondary | ICD-10-CM | POA: Diagnosis not present

## 2020-07-31 DIAGNOSIS — E785 Hyperlipidemia, unspecified: Secondary | ICD-10-CM | POA: Diagnosis not present

## 2020-08-01 DIAGNOSIS — Z951 Presence of aortocoronary bypass graft: Secondary | ICD-10-CM | POA: Insufficient documentation

## 2020-08-07 DIAGNOSIS — E785 Hyperlipidemia, unspecified: Secondary | ICD-10-CM | POA: Diagnosis not present

## 2020-08-07 DIAGNOSIS — I10 Essential (primary) hypertension: Secondary | ICD-10-CM | POA: Diagnosis not present

## 2020-08-07 DIAGNOSIS — Z7982 Long term (current) use of aspirin: Secondary | ICD-10-CM | POA: Diagnosis not present

## 2020-08-07 DIAGNOSIS — Z48812 Encounter for surgical aftercare following surgery on the circulatory system: Secondary | ICD-10-CM | POA: Diagnosis not present

## 2020-08-07 DIAGNOSIS — I480 Paroxysmal atrial fibrillation: Secondary | ICD-10-CM | POA: Diagnosis not present

## 2020-08-07 DIAGNOSIS — K219 Gastro-esophageal reflux disease without esophagitis: Secondary | ICD-10-CM | POA: Diagnosis not present

## 2020-08-07 DIAGNOSIS — E039 Hypothyroidism, unspecified: Secondary | ICD-10-CM | POA: Diagnosis not present

## 2020-08-07 DIAGNOSIS — Z79899 Other long term (current) drug therapy: Secondary | ICD-10-CM | POA: Diagnosis not present

## 2020-08-07 DIAGNOSIS — I2510 Atherosclerotic heart disease of native coronary artery without angina pectoris: Secondary | ICD-10-CM | POA: Diagnosis not present

## 2020-08-07 DIAGNOSIS — E119 Type 2 diabetes mellitus without complications: Secondary | ICD-10-CM | POA: Diagnosis not present

## 2020-08-07 DIAGNOSIS — I214 Non-ST elevation (NSTEMI) myocardial infarction: Secondary | ICD-10-CM | POA: Diagnosis not present

## 2020-08-21 DIAGNOSIS — Z951 Presence of aortocoronary bypass graft: Secondary | ICD-10-CM | POA: Diagnosis not present

## 2020-08-23 DIAGNOSIS — Z951 Presence of aortocoronary bypass graft: Secondary | ICD-10-CM | POA: Diagnosis not present

## 2020-08-28 DIAGNOSIS — Z951 Presence of aortocoronary bypass graft: Secondary | ICD-10-CM | POA: Diagnosis not present

## 2020-09-04 DIAGNOSIS — Z951 Presence of aortocoronary bypass graft: Secondary | ICD-10-CM | POA: Diagnosis not present

## 2020-09-06 DIAGNOSIS — Z951 Presence of aortocoronary bypass graft: Secondary | ICD-10-CM | POA: Diagnosis not present

## 2020-09-11 DIAGNOSIS — Z951 Presence of aortocoronary bypass graft: Secondary | ICD-10-CM | POA: Diagnosis not present

## 2020-09-13 DIAGNOSIS — Z951 Presence of aortocoronary bypass graft: Secondary | ICD-10-CM | POA: Diagnosis not present

## 2020-09-18 DIAGNOSIS — G894 Chronic pain syndrome: Secondary | ICD-10-CM | POA: Diagnosis not present

## 2020-09-18 DIAGNOSIS — M47817 Spondylosis without myelopathy or radiculopathy, lumbosacral region: Secondary | ICD-10-CM | POA: Diagnosis not present

## 2020-09-18 DIAGNOSIS — M25561 Pain in right knee: Secondary | ICD-10-CM | POA: Diagnosis not present

## 2020-09-18 DIAGNOSIS — M4125 Other idiopathic scoliosis, thoracolumbar region: Secondary | ICD-10-CM | POA: Diagnosis not present

## 2020-09-20 DIAGNOSIS — Z951 Presence of aortocoronary bypass graft: Secondary | ICD-10-CM | POA: Diagnosis not present

## 2020-09-25 DIAGNOSIS — Z951 Presence of aortocoronary bypass graft: Secondary | ICD-10-CM | POA: Diagnosis not present

## 2020-09-27 DIAGNOSIS — Z951 Presence of aortocoronary bypass graft: Secondary | ICD-10-CM | POA: Diagnosis not present

## 2020-10-02 DIAGNOSIS — Z951 Presence of aortocoronary bypass graft: Secondary | ICD-10-CM | POA: Diagnosis not present

## 2020-10-04 DIAGNOSIS — Z951 Presence of aortocoronary bypass graft: Secondary | ICD-10-CM | POA: Diagnosis not present

## 2020-10-08 DIAGNOSIS — E119 Type 2 diabetes mellitus without complications: Secondary | ICD-10-CM | POA: Diagnosis not present

## 2020-10-09 DIAGNOSIS — I480 Paroxysmal atrial fibrillation: Secondary | ICD-10-CM | POA: Diagnosis not present

## 2020-10-09 DIAGNOSIS — Z951 Presence of aortocoronary bypass graft: Secondary | ICD-10-CM | POA: Diagnosis not present

## 2020-10-11 DIAGNOSIS — Z951 Presence of aortocoronary bypass graft: Secondary | ICD-10-CM | POA: Diagnosis not present

## 2020-10-17 DIAGNOSIS — E7800 Pure hypercholesterolemia, unspecified: Secondary | ICD-10-CM | POA: Diagnosis not present

## 2020-10-17 DIAGNOSIS — I480 Paroxysmal atrial fibrillation: Secondary | ICD-10-CM | POA: Diagnosis not present

## 2020-10-17 DIAGNOSIS — Z6834 Body mass index (BMI) 34.0-34.9, adult: Secondary | ICD-10-CM | POA: Diagnosis not present

## 2020-10-17 DIAGNOSIS — I10 Essential (primary) hypertension: Secondary | ICD-10-CM | POA: Diagnosis not present

## 2020-10-17 DIAGNOSIS — I25118 Atherosclerotic heart disease of native coronary artery with other forms of angina pectoris: Secondary | ICD-10-CM | POA: Diagnosis not present

## 2020-11-22 DIAGNOSIS — H00015 Hordeolum externum left lower eyelid: Secondary | ICD-10-CM | POA: Diagnosis not present

## 2021-01-01 NOTE — Progress Notes (Signed)
'@Patient'  ID: Sheri [name], female    DOB: 1951/03/03, 70 y.o.   MRN: [id number]  Chief Complaint  Patient presents with  . Follow-up    Shortness of breath with exertion    Referring provider: [name], MD  HPI: 70 year old female, never smoked.  Past medical history significant for hypertension, multiple lung nodules, dyspnea on exertion.  Patient of Dr. [name], last seen on 05/06/2018. No evidence of ILD on HRCT in August 2019.  Small nodules noted on imaging, ordered for repeat CT chest without contrast for September 2020. Dyspnea felt to be related to weight and diastolic dysfunction.  Recommended 90-monthfollow-up.  01/02/2021- Interim hx  Presents today for an overdue follow-up pulmonary nodules/dyspnea, last seen in office in September 2019.   She had CABG in November 2021, no noticeable improvement in dyspnea symptoms since surgery.  She experiences dyspnea symptoms with moderate exertion, if she uses her walker she does not get out of breath. She is attending cardiac rehab three times a week. She is currently at a level 7 on new step for 40-50 mins. Albuterol has not helped in the past. Denies chest tightness, wheezing or cough    PFTs in 2019 showed moderate restriction without BD response and mild diffusion defect. HRCT showed no evidence of ILD, mild air trapping indicative of small airways disease.  Follow-up CT imaging in September 2020 for pulmonary nodules showed no change in appearance of multiple small bilateral pulmonary nodules.  These are compatible with benign process and no further follow-up indicated.  She had coronary artery calcifications and aortic arthrosclerosis on imaging.   No Known Allergies  Immunization History  Administered Date(s) Administered  . Influenza, High Dose Seasonal PF 05/06/2018  . Influenza-Unspecified 06/16/2017, 06/13/2020  . PFIZER(Purple Top)SARS-COV-2 Vaccination 09/07/2019, 09/28/2019, 05/25/2020  . Zoster Recombinat  (Shingrix) 10/05/2017, 02/09/2018    Past Medical History:  Diagnosis Date  . Allergic rhinitis   . Degenerative disc disease, cervical   . Diabetes mellitus without complication ([place]    type two  . Diarrhea 04/2012   colon nl, BX NPD; no response to cholestyramin 03-2012 no response to align; transient resp to sucralfate 12-13  . H/O CT scan of abdomen 08/2012   neg; egd bile reflux gastritisand linear eros; transient resp to sucralfate   . H/O ulcer disease   . Heart murmur    hx of " comes and goes"   . History of blood transfusion   . Hyperlipidemia   . Hyperplastic polyps of stomach   . Hypertension   . Hypothyroidism   . IBS (irritable bowel syndrome)   . Kissing, osteophytes    Dr RCarloyn Manner . Menopause   . Mild anemia 06/2011   14% iron sat,ferritin 14- from Metformin - anemia resolved   . Obesity   . Osteoarthritis   . PONV (postoperative nausea and vomiting)    years ago     Tobacco History: Social History   Tobacco Use  Smoking Status Never Smoker  Smokeless Tobacco Never Used   Counseling given: Not Answered   Outpatient Medications Prior to Visit  Medication Sig Dispense Refill  . acetaminophen (TYLENOL) 650 MG CR tablet Take 1,300 mg by mouth 2 (two) times daily.    .Marland KitchenamLODipine-benazepril (LOTREL) 5-20 MG per capsule Take 1 capsule by mouth every morning.    .Marland Kitchenaspirin 81 MG EC tablet Take 81 mg by mouth daily.    . furosemide (LASIX) 20 MG tablet TAKE 1 TABLET(20  MG) BY MOUTH DAILY 90 tablet 3  . pantoprazole (PROTONIX) 40 MG tablet Take 40 mg by mouth daily.    . rosuvastatin (CRESTOR) 5 MG tablet Take 5 mg by mouth 3 (three) times a week.    . sitaGLIPtin (JANUVIA) 100 MG tablet Take 100 mg by mouth daily.    Marland Kitchen SYNTHROID 125 MCG tablet Take 125 mcg by mouth daily before breakfast.   3  . traMADol (ULTRAM) 50 MG tablet Take 1-2 tablets (50-100 mg total) by mouth every 6 (six) hours as needed for moderate pain (if oxycodone not effective). 40 tablet 0  .  VENTOLIN HFA 108 (90 Base) MCG/ACT inhaler INHALE 2 PUFFS INTO THE LUNGS EVERY 6 HOURS AS NEEDED FOR WHEEZING OR SHORTNESS OF BREATH 54 g 5  . methocarbamol (ROBAXIN) 500 MG tablet Take 1 tablet (500 mg total) by mouth every 6 (six) hours as needed for muscle spasms. (Patient not taking: Reported on 01/02/2021) 40 tablet 0  . oxyCODONE (OXY IR/ROXICODONE) 5 MG immediate release tablet Take 1-3 tablets (5-15 mg total) by mouth every 6 (six) hours as needed for moderate pain (pain score 4-6). (Patient not taking: Reported on 01/02/2021) 84 tablet 0   No facility-administered medications prior to visit.   Review of Systems  Review of Systems  Constitutional: Negative.   HENT: Negative.   Respiratory: Positive for shortness of breath. Negative for cough, chest tightness and wheezing.   Cardiovascular: Negative for chest pain, palpitations and leg swelling.   Physical Exam  BP 114/66 (BP Location: Left Arm, Cuff Size: Normal)   Pulse 80   Temp (!) 97.5 F (36.4 C) (Temporal)   Ht '5\' 5"'  (1.651 m)   Wt 206 lb 6.4 oz (93.6 kg)   SpO2 98% Comment: RA  BMI 34.35 kg/m  Physical Exam Constitutional:      Appearance: Normal appearance.  Cardiovascular:     Rate and Rhythm: Normal rate and regular rhythm.     Comments: No edema Pulmonary:     Effort: Pulmonary effort is normal.     Breath sounds: Normal breath sounds.     Comments: CTA Neurological:     General: No focal deficit present.     Mental Status: She is alert and oriented to person, place, and time. Mental status is at baseline.  Psychiatric:        Mood and Affect: Mood normal.        Behavior: Behavior normal.        Thought Content: Thought content normal.        Judgment: Judgment normal.      Lab Results:  CBC    Component Value Date/Time   WBC 12.7 (H) 05/28/2018 0454   RBC 4.09 05/28/2018 0454   HGB 11.5 (L) 05/28/2018 0454   HGB 12.7 06/08/2013 1335   HCT 36.2 05/28/2018 0454   HCT 38.7 06/08/2013 1335   PLT  420 (H) 05/28/2018 0454   PLT 371 06/08/2013 1335   MCV 88.5 05/28/2018 0454   MCV 88.6 06/08/2013 1335   MCH 28.1 05/28/2018 0454   MCHC 31.8 05/28/2018 0454   RDW 15.6 (H) 05/28/2018 0454   RDW 13.3 06/08/2013 1335   LYMPHSABS 2,842 04/14/2017 1557   LYMPHSABS 2.7 06/08/2013 1335   MONOABS 882 04/14/2017 1557   MONOABS 1.0 (H) 06/08/2013 1335   EOSABS 294 04/14/2017 1557   EOSABS 0.1 06/08/2013 1335   BASOSABS 0 04/14/2017 1557   BASOSABS 0.1 06/08/2013 1335  BMET    Component Value Date/Time   NA 142 05/28/2018 0454   K 3.7 05/28/2018 0454   CL 103 05/28/2018 0454   CO2 28 05/28/2018 0454   GLUCOSE 142 (H) 05/28/2018 0454   BUN 18 05/28/2018 0454   CREATININE 0.72 05/28/2018 0454   CREATININE 0.95 06/16/2017 1503   CALCIUM 9.9 05/28/2018 0454   GFRNONAA >60 05/28/2018 0454   GFRNONAA 67 04/14/2017 1557   GFRAA >60 05/28/2018 0454   GFRAA 78 04/14/2017 1557    BNP No results found for: BNP  ProBNP No results found for: PROBNP  Imaging: DG Chest 2 View  Result Date: 01/04/2021 CLINICAL DATA:  Chronic dyspnea EXAM: CHEST - 2 VIEW COMPARISON:  01/21/2018 FINDINGS: No focal consolidation. No pleural effusion or pneumothorax. Heart and mediastinal contours are unremarkable. Prior median sternotomy. No acute osseous abnormality. IMPRESSION: No active cardiopulmonary disease. Electronically Signed   By: Kathreen Devoid   On: 01/04/2021 11:21     Assessment & Plan:   H/O multiple pulmonary nodules - CT imaging in September 2020 for pulmonary nodules showed no change in appearance of multiple small bilateral pulmonary nodules.  These are compatible with benign process and no further follow-up indicated.   Dyspnea - She reports no noticeable improvement in dyspnea symptoms since CABG.  She experiences dyspnea symptoms with moderate exertion, if she uses her walker she does not get out of breath. No improvement from PRN Albuterol Hfa.  PFTs in 2019 showed moderate  restriction without BD response and mild diffusion defect. HRCT in August 2019 showed no evidence of ILD, mild air trapping indicative of small airways disease. Dyspnea previously felt to be related to weight and diastolic dysfunction. Symptoms do not appear consistent with asthma. BNP has been elevated in past. Needs labs checked with PCP (cbc with diff, BNP, ANA, RF). Recommend weight loss 20-25lbs and formal exercise regimen consisting of 31mns five times a week.     EMartyn Ehrich NP 01/07/2021

## 2021-01-02 ENCOUNTER — Encounter: Payer: Self-pay | Admitting: Primary Care

## 2021-01-02 ENCOUNTER — Other Ambulatory Visit: Payer: Self-pay

## 2021-01-02 ENCOUNTER — Ambulatory Visit (INDEPENDENT_AMBULATORY_CARE_PROVIDER_SITE_OTHER): Payer: Medicare Other

## 2021-01-02 ENCOUNTER — Ambulatory Visit (INDEPENDENT_AMBULATORY_CARE_PROVIDER_SITE_OTHER): Payer: Medicare Other | Admitting: Primary Care

## 2021-01-02 DIAGNOSIS — H2513 Age-related nuclear cataract, bilateral: Secondary | ICD-10-CM | POA: Diagnosis not present

## 2021-01-02 DIAGNOSIS — I10 Essential (primary) hypertension: Secondary | ICD-10-CM | POA: Diagnosis not present

## 2021-01-02 DIAGNOSIS — R0609 Other forms of dyspnea: Secondary | ICD-10-CM

## 2021-01-02 DIAGNOSIS — Z87898 Personal history of other specified conditions: Secondary | ICD-10-CM

## 2021-01-02 DIAGNOSIS — R0600 Dyspnea, unspecified: Secondary | ICD-10-CM | POA: Diagnosis not present

## 2021-01-02 DIAGNOSIS — H5213 Myopia, bilateral: Secondary | ICD-10-CM | POA: Diagnosis not present

## 2021-01-02 DIAGNOSIS — H35033 Hypertensive retinopathy, bilateral: Secondary | ICD-10-CM | POA: Diagnosis not present

## 2021-01-02 DIAGNOSIS — E119 Type 2 diabetes mellitus without complications: Secondary | ICD-10-CM | POA: Diagnosis not present

## 2021-01-02 NOTE — Assessment & Plan Note (Addendum)
-   She reports no noticeable improvement in dyspnea symptoms since CABG.  She experiences dyspnea symptoms with moderate exertion, if she uses her walker she does not get out of breath. No improvement from PRN Albuterol Hfa.  PFTs in 2019 showed moderate restriction without BD response and mild diffusion defect. HRCT in August 2019 showed no evidence of ILD, mild air trapping indicative of small airways disease. Dyspnea previously felt to be related to weight and diastolic dysfunction. Symptoms do not appear consistent with asthma. BNP has been elevated in past. Needs labs checked with PCP (cbc with diff, BNP, ANA, RF). Recommend weight loss 20-25lbs and formal exercise regimen consisting of 24mins five times a week.

## 2021-01-02 NOTE — Assessment & Plan Note (Addendum)
-   CT imaging in September 2020 for pulmonary nodules showed no change in appearance of multiple small bilateral pulmonary nodules.  These are compatible with benign process and no further follow-up indicated.

## 2021-01-02 NOTE — Patient Instructions (Addendum)
Causes of lung nodules can be infection, lung cancer, growth lung tissue, swelling d/t underlying RA  Typically lung nodules are caused by previous infection or illness which inflames lung tissue and can form small clumbs of cells   Work on weight loss efforts, goal would be 180-185lbs. Get some form of exercise 20-68mins five times a week   Recommendations: - Have labs checked with PCP (cbc with diff, BNP, ANA, RF)   Follow-up: - 3 months with Dr. [name] OR as needed if breathing does not improve with weight loss/conditioning level

## 2021-01-03 DIAGNOSIS — M4125 Other idiopathic scoliosis, thoracolumbar region: Secondary | ICD-10-CM | POA: Diagnosis not present

## 2021-01-03 DIAGNOSIS — E785 Hyperlipidemia, unspecified: Secondary | ICD-10-CM | POA: Diagnosis not present

## 2021-01-03 DIAGNOSIS — E669 Obesity, unspecified: Secondary | ICD-10-CM | POA: Diagnosis not present

## 2021-01-03 DIAGNOSIS — Z789 Other specified health status: Secondary | ICD-10-CM | POA: Diagnosis not present

## 2021-01-03 DIAGNOSIS — Z79891 Long term (current) use of opiate analgesic: Secondary | ICD-10-CM | POA: Diagnosis not present

## 2021-01-03 DIAGNOSIS — M2550 Pain in unspecified joint: Secondary | ICD-10-CM | POA: Diagnosis not present

## 2021-01-03 DIAGNOSIS — Z0000 Encounter for general adult medical examination without abnormal findings: Secondary | ICD-10-CM | POA: Diagnosis not present

## 2021-01-03 DIAGNOSIS — E039 Hypothyroidism, unspecified: Secondary | ICD-10-CM | POA: Diagnosis not present

## 2021-01-03 DIAGNOSIS — I10 Essential (primary) hypertension: Secondary | ICD-10-CM | POA: Diagnosis not present

## 2021-01-03 DIAGNOSIS — M25561 Pain in right knee: Secondary | ICD-10-CM | POA: Diagnosis not present

## 2021-01-03 DIAGNOSIS — K219 Gastro-esophageal reflux disease without esophagitis: Secondary | ICD-10-CM | POA: Diagnosis not present

## 2021-01-03 DIAGNOSIS — Z7984 Long term (current) use of oral hypoglycemic drugs: Secondary | ICD-10-CM | POA: Diagnosis not present

## 2021-01-03 DIAGNOSIS — G894 Chronic pain syndrome: Secondary | ICD-10-CM | POA: Diagnosis not present

## 2021-01-03 DIAGNOSIS — M47817 Spondylosis without myelopathy or radiculopathy, lumbosacral region: Secondary | ICD-10-CM | POA: Diagnosis not present

## 2021-01-03 DIAGNOSIS — E1169 Type 2 diabetes mellitus with other specified complication: Secondary | ICD-10-CM | POA: Diagnosis not present

## 2021-01-03 DIAGNOSIS — R0602 Shortness of breath: Secondary | ICD-10-CM | POA: Diagnosis not present

## 2021-01-03 DIAGNOSIS — Z1389 Encounter for screening for other disorder: Secondary | ICD-10-CM | POA: Diagnosis not present

## 2021-01-03 DIAGNOSIS — I2510 Atherosclerotic heart disease of native coronary artery without angina pectoris: Secondary | ICD-10-CM | POA: Diagnosis not present

## 2021-01-04 NOTE — Progress Notes (Signed)
Please let patient know her CXR was normal. No active cardiopulmonary process.

## 2021-01-07 ENCOUNTER — Encounter: Payer: Self-pay | Admitting: Primary Care

## 2021-01-07 ENCOUNTER — Encounter: Payer: Self-pay | Admitting: *Deleted

## 2021-01-07 NOTE — Telephone Encounter (Signed)
Please be on look out for fax with this lab works and place on my desk when we get it. Once I receive I will review and make recommendations

## 2021-01-07 NOTE — Telephone Encounter (Signed)
Routing encounter to myself since I am the main one who covers [name]'s box. Will give her the results once received.

## 2021-01-07 NOTE — Telephone Encounter (Signed)
Received the following from patient:   "[name],   Dr [name] did all the labs that you requested the day after my visit with you.  And a sed rate which was elevated.  I requested that they send you the labs because many were abnormal.   Please let me know if any could contribute to my SOB?  Dr Tamala Julian said my low (10.5) hemoglobin could contribute as well as my weight.   Please tell me who I need to see next?   Thank you,  Sheri [name]."  [name], please advise. Thanks.

## 2021-01-12 DIAGNOSIS — Z23 Encounter for immunization: Secondary | ICD-10-CM | POA: Diagnosis not present

## 2021-01-14 DIAGNOSIS — E119 Type 2 diabetes mellitus without complications: Secondary | ICD-10-CM | POA: Diagnosis not present

## 2021-01-14 DIAGNOSIS — H25813 Combined forms of age-related cataract, bilateral: Secondary | ICD-10-CM | POA: Diagnosis not present

## 2021-01-14 DIAGNOSIS — H524 Presbyopia: Secondary | ICD-10-CM | POA: Diagnosis not present

## 2021-01-15 NOTE — Telephone Encounter (Signed)
labwork was received and has been placed in [name]'s review folder by her computer on the Pod. Routing to [employer] as an Pharmacist, hospital.

## 2021-01-21 NOTE — Telephone Encounter (Signed)
Her ANA was positive and sed rate (inflammatory marker) was elevated. Rheumatoid factor was negative. They did not have BNP drawn which would tell me give me some insight if she was fluid overloaded which could cause shortness of breath.   Does she have any rashes , dry/eye or mouth, joint pain, muscle weakness, taut/tight skin. If she has any of these complaints would consider rheumatology referral

## 2021-01-22 NOTE — Telephone Encounter (Signed)
[name], please see mychart message sent by pt:  To: [facility]    From: INDONESIA MCKEOUGH    Created: 01/22/2021 2:39 PM     *-*-*This message has not been handled.*-*-*  I did have a BNP drawn and it was elevated at 185.9 and they wanted me to call my cardiologist.   I talked with Ozzie Hoyle who is PA for Dr Cannon Kettle in [place] who did cardiac cath when I had heart attack.  Cliff said BNP was not high enough to concern him.   I do have joint pain, muscle weakness,  anemia, and other abnormal labs.    Please let me know what I should do to help me resolve my SOB.  Thank you,  Sheri [name]

## 2021-01-22 NOTE — Telephone Encounter (Signed)
Needs visit with MR first available please, she last saw him > 2 years ago. Refer to rheumatology re: joint pain and positive ANA

## 2021-02-01 ENCOUNTER — Telehealth: Payer: Self-pay | Admitting: Primary Care

## 2021-02-01 NOTE — Telephone Encounter (Signed)
Called and spoke with Patient.  Patient stated she was billed for 01/02/21 OV with Eustaquio Maize, NP as a routine OV.  Patient stated she was told by her PCP to see pulmonology for abnormal labs and continued sob.  Patient stated Medicare told her they will not cover routine OV.  Patient stated she was advised by Medicare to call office and have OV coded different.    Message routed to Kathlee Nations to follow up and advise

## 2021-02-05 NOTE — Telephone Encounter (Signed)
Called and spoke with Sheri [name].  Sheri [name] made aware Kathlee Nations is requesting a charge change and a corrected claim with be sent to Medicare.  Sheri [name] aware she does not need to do anything.  Advised Sheri [name] to call with any issues or concerns.  Nothing further at this time.

## 2021-02-22 ENCOUNTER — Ambulatory Visit: Payer: Medicare Other | Admitting: Internal Medicine

## 2021-04-03 DIAGNOSIS — N390 Urinary tract infection, site not specified: Secondary | ICD-10-CM | POA: Diagnosis not present

## 2021-04-04 DIAGNOSIS — M47817 Spondylosis without myelopathy or radiculopathy, lumbosacral region: Secondary | ICD-10-CM | POA: Diagnosis not present

## 2021-04-04 DIAGNOSIS — M4125 Other idiopathic scoliosis, thoracolumbar region: Secondary | ICD-10-CM | POA: Diagnosis not present

## 2021-04-04 DIAGNOSIS — M25561 Pain in right knee: Secondary | ICD-10-CM | POA: Diagnosis not present

## 2021-04-04 DIAGNOSIS — G894 Chronic pain syndrome: Secondary | ICD-10-CM | POA: Diagnosis not present

## 2021-05-02 ENCOUNTER — Other Ambulatory Visit: Payer: Self-pay

## 2021-05-02 ENCOUNTER — Encounter: Payer: Self-pay | Admitting: Internal Medicine

## 2021-05-02 ENCOUNTER — Ambulatory Visit (INDEPENDENT_AMBULATORY_CARE_PROVIDER_SITE_OTHER): Payer: Medicare Other | Admitting: Internal Medicine

## 2021-05-02 ENCOUNTER — Ambulatory Visit (INDEPENDENT_AMBULATORY_CARE_PROVIDER_SITE_OTHER): Payer: Medicare Other

## 2021-05-02 VITALS — BP 138/74 | HR 75 | Temp 98.1°F | Ht 65.0 in | Wt 202.0 lb

## 2021-05-02 DIAGNOSIS — M791 Myalgia, unspecified site: Secondary | ICD-10-CM

## 2021-05-02 DIAGNOSIS — R0609 Other forms of dyspnea: Secondary | ICD-10-CM

## 2021-05-02 DIAGNOSIS — Z87898 Personal history of other specified conditions: Secondary | ICD-10-CM

## 2021-05-02 DIAGNOSIS — R0600 Dyspnea, unspecified: Secondary | ICD-10-CM

## 2021-05-02 DIAGNOSIS — Z862 Personal history of diseases of the blood and blood-forming organs and certain disorders involving the immune mechanism: Secondary | ICD-10-CM

## 2021-05-02 DIAGNOSIS — R768 Other specified abnormal immunological findings in serum: Secondary | ICD-10-CM | POA: Diagnosis not present

## 2021-05-02 DIAGNOSIS — R946 Abnormal results of thyroid function studies: Secondary | ICD-10-CM

## 2021-05-02 DIAGNOSIS — I2510 Atherosclerotic heart disease of native coronary artery without angina pectoris: Secondary | ICD-10-CM

## 2021-05-02 DIAGNOSIS — M2550 Pain in unspecified joint: Secondary | ICD-10-CM | POA: Diagnosis not present

## 2021-05-02 LAB — HEPATIC FUNCTION PANEL
ALT: 12 U/L (ref 0–35)
AST: 15 U/L (ref 0–37)
Albumin: 4.4 g/dL (ref 3.5–5.2)
Alkaline Phosphatase: 95 U/L (ref 39–117)
Bilirubin, Direct: 0.1 mg/dL (ref 0.0–0.3)
Total Bilirubin: 0.4 mg/dL (ref 0.2–1.2)
Total Protein: 7.4 g/dL (ref 6.0–8.3)

## 2021-05-02 LAB — BRAIN NATRIURETIC PEPTIDE: Pro B Natriuretic peptide (BNP): 209 pg/mL — ABNORMAL HIGH (ref 0.0–100.0)

## 2021-05-02 LAB — CBC WITH DIFFERENTIAL/PLATELET
Basophils Absolute: 0.3 10*3/uL — ABNORMAL HIGH (ref 0.0–0.1)
Basophils Relative: 3.2 % — ABNORMAL HIGH (ref 0.0–3.0)
Eosinophils Absolute: 0.4 10*3/uL (ref 0.0–0.7)
Eosinophils Relative: 4 % (ref 0.0–5.0)
HCT: 34.1 % — ABNORMAL LOW (ref 36.0–46.0)
Hemoglobin: 11.2 g/dL — ABNORMAL LOW (ref 12.0–15.0)
Lymphocytes Relative: 23.2 % (ref 12.0–46.0)
Lymphs Abs: 2.5 10*3/uL (ref 0.7–4.0)
MCHC: 32.8 g/dL (ref 30.0–36.0)
MCV: 78.5 fl (ref 78.0–100.0)
Monocytes Absolute: 1.1 10*3/uL — ABNORMAL HIGH (ref 0.1–1.0)
Monocytes Relative: 10 % (ref 3.0–12.0)
Neutro Abs: 6.3 10*3/uL (ref 1.4–7.7)
Neutrophils Relative %: 59.6 % (ref 43.0–77.0)
Platelets: 465 10*3/uL — ABNORMAL HIGH (ref 150.0–400.0)
RBC: 4.34 Mil/uL (ref 3.87–5.11)
RDW: 17.1 % — ABNORMAL HIGH (ref 11.5–15.5)
WBC: 10.5 10*3/uL (ref 4.0–10.5)

## 2021-05-02 LAB — SEDIMENTATION RATE: Sed Rate: 58 mm/hr — ABNORMAL HIGH (ref 0–30)

## 2021-05-02 LAB — BASIC METABOLIC PANEL
BUN: 20 mg/dL (ref 6–23)
CO2: 26 mEq/L (ref 19–32)
Calcium: 10.7 mg/dL — ABNORMAL HIGH (ref 8.4–10.5)
Chloride: 101 mEq/L (ref 96–112)
Creatinine, Ser: 0.94 mg/dL (ref 0.40–1.20)
GFR: 61.02 mL/min (ref >60.00)
Glucose, Bld: 101 mg/dL — ABNORMAL HIGH (ref 70–99)
Potassium: 4.4 mEq/L (ref 3.5–5.1)
Sodium: 137 mEq/L (ref 135–145)

## 2021-05-02 LAB — T4, FREE: Free T4: 1.13 ng/dL (ref 0.60–1.60)

## 2021-05-02 LAB — TSH: TSH: 0.76 u[IU]/mL (ref 0.35–5.50)

## 2021-05-02 LAB — TROPONIN I (HIGH SENSITIVITY): High Sens Troponin I: 8 ng/L (ref 2–17)

## 2021-05-02 NOTE — Addendum Note (Signed)
Addended by: Suzzanne Cloud E on: 05/02/2021 02:44 PM   Modules accepted: Orders

## 2021-05-02 NOTE — Patient Instructions (Addendum)
ICD-10-CM   1. Dyspnea on exertion  R06.00     2. H/O multiple pulmonary nodules  Z87.898     3. History of anemia  Z86.2     4. Coronary artery disease involving native heart without angina pectoris, unspecified vessel or lesion type  I25.10     5. Positive ANA (antinuclear antibody)  R76.8     6. Thyroid function test abnormal  R94.6     7. Myalgia  M79.10     8. Arthralgia, unspecified joint  M25.50      Unclear what is exactly driving current shortness of breath although it could be the same previous reasons of physical deconditioning obesity and diastolic dysfunction.  Also it could be that her new problems  Plan - Do CBC with differential, chemistry, liver function test, BNP, troponin -Do ANA, rheumatoid factor, ESR, CCP, double-stranded DNA, SSA, SSB, SCL-70 -Do TSH, free T4 and T3 -Do echocardiogram -Do chest x-ray two-view  Follow-up - Complete all this and return to see Derl Barrow in the next few weeks to discuss test results  -If there is anything significantly abnormal in the interim we will call you

## 2021-05-02 NOTE — Addendum Note (Signed)
Addended by: Lorretta Harp on: 05/02/2021 02:39 PM   Modules accepted: Orders

## 2021-05-02 NOTE — Progress Notes (Signed)
PCP Sheri Ada, MD   HPI  IOV 01/21/2018  Chief Complaint  Patient presents with   Consult    SOB w/exertion and while resting, patient reports the tightness she feels during SOB radiates across her neck and chest. Last chest x ray August 2018. PICC line removed Monday for knee infection (ceftriaxone).     Sheri [name] is a 70 year old lady presenting for consultation for chronic shortness of breath.  She tells me that she suffers from obesity with a BMI close to 35 and associated hypertension and diabetes.  She is a non-smoker.  She has insidious onset of shortness of breath for the last 3 years.  She describes the shortness of breath as chest tightness in her upper chest radiating into the neck and back of the neck.  Because of this she did have a cardiac stress test in December 2017 that I personally reviewed the result it is normal with an ejection fraction of 66% [in 2012 review of the chart shows she had an echocardiogram that is reported as normal but no comment on diastolic dysfunction].  After that she did not really notice shortness of breath because her health issues have been complicated by occurrence of group B streptococcus right knee infection and sepsis.  She has battled this for most of 2018 requiring chronic antibiotic therapy.  Subsequently in early 2019 there was reinfection of this and she has had all the prosthesis removed and now has a spacer followed by prolonged antibiotic therapy ending 3 days ago.  She uses a walker now to get around.  She is due for a total knee replacement at some point in time in the future.  Her orthopedic surgeon is Dr. Lucienne [name].  She says that she has been instructed to re-exert herself and recently while walking at [facility] after doing groceries her symptoms record.  The same thing happened a few years ago before the knee problems while she walked to football for lengths to see her daughter who had a hysterectomy.  There is no  diaphoresis.  Is no chest pain orthopnea proximal nocturnal dyspnea wheezing or weight loss.  She has never tried albuterol for the same   Personal visualization of a CT scan abdomen lung cuts: Shows healthy lung base.  Personal visualization of the chest x-ray agree with the formal report in August 2018 shows clear lung  Stress test dec 2017 - normal with ef 66%   OV 03/17/2018  Chief Complaint  Patient presents with   Follow-up    Echo performed 5/30 and PFT performed today.  Pt states she has been doing okay since last visit. Pt states she has another UTI and also states she believes she is retaining fluid and also states SOB is the same. Pt is scheduled to have knee surgery September 25 and is needing surgical clearance.    For shortness of breath with suspicion of diastolic dysfunction related to obesity and physical deconditioning in the setting of recurrent right knee infection   Sheri [name]  Bronchitic changes - on interim chest x-ray. She had an echocardiogram that showed grade 1 diastolic dysfunction. She had pulmonary function test today that shows restriction with reduced diffusion capacity. Her shortness of breath continues unchanged. In the interim she is also gained fluid with edema. She feels Lasix will help. She is also dealing with recurrent urinary infection. She is due for right knee replacement 05/26/2018 but this could simply be earlier. She wants a preop clearance.  OV 05/06/2018  Subjective:  [name], female , DOB: 1951-04-19 , age 89 y.o. , MRN: [id number] , ADDRESS: [place] [id number]   05/06/2018 -   Chief Complaint  Patient presents with   Follow-up    HRCT performed 8/5.  Doing better she feels the lasix is helping. She feels that there is something in her thoart that is making SOB worse and causes problems eating.     HPI Sheri [name] 70 y.o. -morbidly obese female here for shortness of breath that  clinically is related to obesity and chronic diastolic dysfunction. After starting Lasix she reports she is much better. Improved dyspnea. HRCT aug 2019 ruled out ILD. Her other issues are / J  A. preop pulm clearance -has knee surgery 05/26/18 - she is ready for it B. New issue - multple < 33m nodules aug 2019 CT C. Reports chronic throast tightness - random and worse with eating some foods. No dysphgaia. Feels associted with dyspnea. No prior ENT eval   Simple office walk 185 feet x  3 laps goal with forehead probe 01/21/2018   O2 used none  Number laps completed 3  Comments about pace Slow pace with walker due to knee spacer  Resting Pulse Ox/HR 100% and 70/min  Final Pulse Ox/HR 100% and 117/min  Desaturated </= 88% no  Desaturated <= 3% points no  Got Tachycardic >/= 90/min yes  Symptoms at end of test x  Miscellaneous comments Used walker   Results for Sheri, [name](MRN [id number] as of 03/17/2018 11:01  Ref. Range 03/17/2018 09:33  FVC-Pre Latest Units: L 2.20  FVC-%Pred-Pre Latest Units: % 72  Results for [name], [name](MRN [id number] as of 03/17/2018 11:01  Ref. Range 03/17/2018 09:33  Pre FEV1/FVC ratio Latest Units: % 83  Results for [name], [name](MRN [id number] as of 03/17/2018 11:01  Ref. Range 03/17/2018 09:33  DLCO unc Latest Units: ml/min/mmHg 16.12  DLCO unc % pred Latest Units: % 66     ROS - per HPI   01/02/2021- Interim hx  Presents today for an overdue follow-up pulmonary nodules/dyspnea, last seen in office in September 2019.   She had CABG in November 2021, no noticeable improvement in dyspnea symptoms since surgery.  She experiences dyspnea symptoms with moderate exertion, if she uses her walker she does not get out of breath. She is attending cardiac rehab three times a week. She is currently at a level 7 on new step for 40-50 mins. Albuterol has not helped in the past. Denies chest tightness, wheezing or cough    PFTs in 2019 showed  moderate restriction without BD response and mild diffusion defect. HRCT showed no evidence of ILD, mild air trapping indicative of small airways disease.  Follow-up CT imaging in September 2020 for pulmonary nodules showed no change in appearance of multiple small bilateral pulmonary nodules.  These are compatible with benign process and no further follow-up indicated.  She had coronary artery calcifications and aortic arthrosclerosis on imaging.   OV 05/02/2021  Subjective:  Patient ID: PNelly [name] female , DOB: [birth year], age 70y.o. , MRN: [id number], [address] [facility] [id number] SCarol Ada MD Patient Care Team: SCarol Ada MD as PCP - General  This Provider for this visit: Treatment Team:  Attending Provider: RBrand Males MD    05/02/2021 -   Chief Complaint  Patient presents with   Follow-up  Pt states she have concerns. Pt states she feel horrible and is SOB. Heart attack and simple bypass in November 2021. Have brain fog and feeling like something is stuck in her throat on the left side     HPI Sheri [name] 70 y.o. -personally seeing her after 2019.  In the interim she saw Derl Barrow and aspiration in May 2022.  She has been spending most of the time in [place] where her grandkids and children.  However she prefers to have a healthcare in [place].  She tells me that in November 2020 when she had a sudden heart attack and had bypass in [facility].  Her last chest x-ray November 2021 showed left basilar opacities and bilateral pleural effusions.  This was a report from [place] but in our system last chest imaging was September 2020 CT scan of the chest that was clear without ILD.  When she saw her breath while she was doing cardiac rehab.  She tells me that despite the bypass and despite doing cardiac rehab she still has fixed dyspnea on exertion that also has variability pattern.  She gets dyspneic on an  average for 50 feet and relieved by rest but sometimes the dyspnea is worse even at rest.  Sometimes she can exert a lot more and be fine.  Definitely rehab she is better than when she is out of rehab.  She also has significant amount of arthralgia and overall fatigue.    Her TSH in November 2021 was 0.5 with a T4 of 14.3 and elevated at [facility] She was anemic and November 2021 with a hemoglobin of 9.1 g% at [place]  -In our system the last blood work was in 20 1911.5 g% Her BNP was 2000 252 proBNP at [facility] in November 2021 She gives a history of high calcium burden #2021 at [facility] health calcium was 9.5 and normal  She had a normal sed rate in 2019 in our system  She had a positive ANA 1: 80 in 2013.  Based on this in May 2022 rheumatology referral was made but she is yet to see the rheumatologist.  CT Chest data  No results found.    PFT  PFT Results Latest Ref Rng & Units 03/17/2018  FVC-Pre L 2.20  FVC-Predicted Pre % 72  FVC-Post L 2.07  FVC-Predicted Post % 68  Pre FEV1/FVC % % 83  Post FEV1/FCV % % 81  FEV1-Pre L 1.83  FEV1-Predicted Pre % 79  FEV1-Post L 1.68  DLCO uncorrected ml/min/mmHg 16.12  DLCO UNC% % 66  DLVA Predicted % 89   IMPRESSION: 1. No change in the appearance of multiple small bilateral pulmonary nodules. These are compatible with a benign process and no further follow-up is indicated at this time. This recommendation follows the consensus statement: Guidelines for Management of Small Pulmonary Nodules Detected on CT Images: From the Fleischner Society 2017; Radiology 2017; 284:228-243. 2.  Aortic Atherosclerosis (ICD10-I70.0). 3. Coronary artery calcifications.     Electronically Signed   By: Kerby Moors M.D.   On: 05/28/2019 12:50      has a past medical history of Allergic rhinitis, Degenerative disc disease, cervical, Diabetes mellitus without complication ([place]), Diarrhea (04/2012), H/O CT scan of abdomen (08/2012), H/O ulcer disease,  Heart murmur, History of blood transfusion, Hyperlipidemia, Hyperplastic polyps of stomach, Hypertension, Hypothyroidism, IBS (irritable bowel syndrome), Kissing, osteophytes, Menopause, Mild anemia (06/2011), Obesity, Osteoarthritis, and PONV (postoperative nausea and vomiting).   reports that she has never  smoked. She has never used smokeless tobacco.  Past Surgical History:  Procedure Laterality Date   CESAREAN SECTION     CHOLECYSTECTOMY     COLONOSCOPY  2/06,04/20/12 repeat 10 years   dr Buccini   EXCISIONAL TOTAL KNEE ARTHROPLASTY WITH ANTIBIOTIC SPACERS Right 11/23/2017   Procedure: Right knee resection arthroplasty; antibiotic spacer;  Surgeon: Gaynelle Arabian, MD;  Location: [facility];  Service: Orthopedics;  Laterality: Right;   I & D KNEE WITH POLY EXCHANGE Right 01/22/2017   Procedure: IRRIGATION AND DEBRIDEMENT KNEE WITH POLY EXCHANGE;  Surgeon: Paralee Cancel, MD;  Location: [facility];  Service: Orthopedics;  Laterality: Right;   JOINT REPLACEMENT  12/2011   right knee    REIMPLANTATION OF TOTAL KNEE Right 05/26/2018   Procedure: Right total knee arthroplasty reimplantation;  Surgeon: Gaynelle Arabian, MD;  Location: [facility];  Service: Orthopedics;  Laterality: Right;   TOTAL KNEE ARTHROPLASTY Left 02/12/2015   Procedure: LEFT TOTAL KNEE ARTHROPLASTY;  Surgeon: Gaynelle Arabian, MD;  Location: [facility];  Service: Orthopedics;  Laterality: Left;    No Known Allergies  Immunization History  Administered Date(s) Administered   Influenza, High Dose Seasonal PF 05/06/2018   Influenza-Unspecified 06/16/2017, 06/13/2020   PFIZER(Purple Top)SARS-COV-2 Vaccination 09/07/2019, 09/28/2019, 05/25/2020   Zoster Recombinat (Shingrix) 10/05/2017, 02/09/2018    Family History  Problem Relation Age of Onset   Other Mother 54       sepsis of unknown etiology   Lung cancer Father 16   Diabetes Mellitus II Father    Atrial fibrillation Father      Current Outpatient Medications:    acetaminophen  (TYLENOL) 650 MG CR tablet, Take 1,300 mg by mouth 2 (two) times daily., Disp: , Rfl:    amLODipine-benazepril (LOTREL) 5-20 MG per capsule, Take 1 capsule by mouth every morning., Disp: , Rfl:    aspirin 81 MG EC tablet, Take 81 mg by mouth daily., Disp: , Rfl:    furosemide (LASIX) 20 MG tablet, TAKE 1 TABLET(20 MG) BY MOUTH DAILY, Disp: 90 tablet, Rfl: 3   methocarbamol (ROBAXIN) 500 MG tablet, Take 1 tablet (500 mg total) by mouth every 6 (six) hours as needed for muscle spasms., Disp: 40 tablet, Rfl: 0   oxyCODONE (OXY IR/ROXICODONE) 5 MG immediate release tablet, Take 1-3 tablets (5-15 mg total) by mouth every 6 (six) hours as needed for moderate pain (pain score 4-6)., Disp: 84 tablet, Rfl: 0   pantoprazole (PROTONIX) 40 MG tablet, Take 40 mg by mouth daily., Disp: , Rfl:    rosuvastatin (CRESTOR) 5 MG tablet, Take 5 mg by mouth 3 (three) times a week., Disp: , Rfl:    sitaGLIPtin (JANUVIA) 100 MG tablet, Take 100 mg by mouth daily., Disp: , Rfl:    SYNTHROID 125 MCG tablet, Take 125 mcg by mouth daily before breakfast. , Disp: , Rfl: 3   traMADol (ULTRAM) 50 MG tablet, Take 1-2 tablets (50-100 mg total) by mouth every 6 (six) hours as needed for moderate pain (if oxycodone not effective)., Disp: 40 tablet, Rfl: 0   VENTOLIN HFA 108 (90 Base) MCG/ACT inhaler, INHALE 2 PUFFS INTO THE LUNGS EVERY 6 HOURS AS NEEDED FOR WHEEZING OR SHORTNESS OF BREATH (Patient not taking: Reported on 05/02/2021), Disp: 54 g, Rfl: 5      Objective:   Vitals:   05/02/21 1342  BP: 138/74  Pulse: 75  Temp: 98.1 F (36.7 C)  TempSrc: Oral  SpO2: 98%  Weight: 202 lb (91.6 kg)  Height: _0  (  1.651 m)    Estimated body mass index is 33.61 kg/m as calculated from the following:   Height as of this encounter: _0  (1.651 m).   Weight as of this encounter: 202 lb (91.6 kg).  _1 @  Filed Weights   05/02/21 1342  Weight: 202 lb (91.6 kg)     Physical Exam    General: No distress.  Obese Neuro: Alert and Oriented x 3. GCS 15. Speech normal Psych: Pleasant Resp:  Barrel Chest - no.  Wheeze - no, Crackles - no, No overt respiratory distress CVS: Normal heart sounds. Murmurs - no Ext: Stigmata of Connective Tissue Disease - no HEENT: Normal upper airway. PEERL +. No post nasal drip        Assessment:       ICD-10-CM   1. Dyspnea on exertion  R06.00     2. H/O multiple pulmonary nodules  Z87.898     3. History of anemia  Z86.2     4. Coronary artery disease involving native heart without angina pectoris, unspecified vessel or lesion type  I25.10     5. Positive ANA (antinuclear antibody)  R76.8     6. Thyroid function test abnormal  R94.6     7. Myalgia  M79.10     8. Arthralgia, unspecified joint  M25.50          Plan:     Patient Instructions     ICD-10-CM   1. Dyspnea on exertion  R06.00     2. H/O multiple pulmonary nodules  Z87.898     3. History of anemia  Z86.2     4. Coronary artery disease involving native heart without angina pectoris, unspecified vessel or lesion type  I25.10     5. Positive ANA (antinuclear antibody)  R76.8     6. Thyroid function test abnormal  R94.6     7. Myalgia  M79.10     8. Arthralgia, unspecified joint  M25.50      Unclear what is exactly driving current shortness of breath although it could be the same previous reasons of physical deconditioning obesity and diastolic dysfunction.  Also it could be that her new problems  Plan - Do CBC with differential, chemistry, liver function test, BNP, troponin -Do ANA, rheumatoid factor, ESR, CCP, double-stranded DNA, SSA, SSB, SCL-70 -Do TSH, free T4 and T3 -Do echocardiogram -Do chest x-ray two-view  Follow-up - Complete all this and return to see Derl Barrow in the next few weeks to discuss test results  -If there is anything significantly abnormal in the interim we will call you    SIGNATURE    Dr. Brand Males, M.D., F.C.C.P,  Pulmonary and  Critical Care Medicine Staff Physician, [facility] Director - Interstitial Lung Disease  Program  Pulmonary [facility] at [place], [place], [address]  Pager: [phone], If no answer or between  15:00h - 7:00h: call [phone] Telephone: [phone]  2:24 PM 05/02/2021

## 2021-05-09 LAB — ANA: Anti Nuclear Antibody (ANA): POSITIVE — AB

## 2021-05-09 LAB — RHEUMATOID FACTOR: Rheumatoid fact SerPl-aCnc: <14 IU/mL (ref <14)

## 2021-05-09 LAB — CYCLIC CITRUL PEPTIDE ANTIBODY, IGG: Cyclic Citrullin Peptide Ab: <16 UNITS

## 2021-05-09 LAB — ANTI-NUCLEAR AB-TITER (ANA TITER): ANA Titer 1: 1:80 {titer} — ABNORMAL HIGH

## 2021-05-09 LAB — ANTI-DNA ANTIBODY, DOUBLE-STRANDED: ds DNA Ab: <1 IU/mL

## 2021-05-09 LAB — SJOGREN'S SYNDROME ANTIBODS(SSA + SSB)
SSA (Ro) (ENA) Antibody, IgG: <1 AI
SSB (La) (ENA) Antibody, IgG: <1 AI

## 2021-05-09 LAB — ANTI-SCLERODERMA ANTIBODY: Scleroderma (Scl-70) (ENA) Antibody, IgG: <1 AI

## 2021-05-20 ENCOUNTER — Ambulatory Visit: Payer: Medicare Other | Admitting: Primary Care

## 2021-05-31 DIAGNOSIS — Z23 Encounter for immunization: Secondary | ICD-10-CM | POA: Diagnosis not present

## 2021-06-10 ENCOUNTER — Ambulatory Visit: Payer: Medicare Other | Admitting: Primary Care

## 2021-06-12 DIAGNOSIS — Z23 Encounter for immunization: Secondary | ICD-10-CM | POA: Diagnosis not present

## 2021-07-06 DIAGNOSIS — Z20828 Contact with and (suspected) exposure to other viral communicable diseases: Secondary | ICD-10-CM | POA: Diagnosis not present

## 2021-07-09 DIAGNOSIS — I2510 Atherosclerotic heart disease of native coronary artery without angina pectoris: Secondary | ICD-10-CM | POA: Diagnosis not present

## 2021-07-09 DIAGNOSIS — I10 Essential (primary) hypertension: Secondary | ICD-10-CM | POA: Diagnosis not present

## 2021-07-09 DIAGNOSIS — E039 Hypothyroidism, unspecified: Secondary | ICD-10-CM | POA: Diagnosis not present

## 2021-07-09 DIAGNOSIS — E785 Hyperlipidemia, unspecified: Secondary | ICD-10-CM | POA: Diagnosis not present

## 2021-07-09 DIAGNOSIS — E1169 Type 2 diabetes mellitus with other specified complication: Secondary | ICD-10-CM | POA: Diagnosis not present

## 2021-07-09 DIAGNOSIS — M1990 Unspecified osteoarthritis, unspecified site: Secondary | ICD-10-CM | POA: Diagnosis not present

## 2021-07-09 DIAGNOSIS — R011 Cardiac murmur, unspecified: Secondary | ICD-10-CM | POA: Diagnosis not present

## 2021-07-15 ENCOUNTER — Telehealth: Payer: Self-pay | Admitting: Primary Care

## 2021-07-15 ENCOUNTER — Other Ambulatory Visit: Payer: Self-pay

## 2021-07-15 ENCOUNTER — Ambulatory Visit (INDEPENDENT_AMBULATORY_CARE_PROVIDER_SITE_OTHER): Payer: Medicare Other | Admitting: Primary Care

## 2021-07-15 ENCOUNTER — Encounter: Payer: Self-pay | Admitting: Primary Care

## 2021-07-15 VITALS — BP 126/68 | HR 83 | Temp 98.6°F | Ht 65.0 in | Wt 205.2 lb

## 2021-07-15 DIAGNOSIS — M2550 Pain in unspecified joint: Secondary | ICD-10-CM | POA: Diagnosis not present

## 2021-07-15 DIAGNOSIS — M25561 Pain in right knee: Secondary | ICD-10-CM | POA: Diagnosis not present

## 2021-07-15 DIAGNOSIS — E1169 Type 2 diabetes mellitus with other specified complication: Secondary | ICD-10-CM | POA: Diagnosis not present

## 2021-07-15 DIAGNOSIS — R768 Other specified abnormal immunological findings in serum: Secondary | ICD-10-CM

## 2021-07-15 DIAGNOSIS — G894 Chronic pain syndrome: Secondary | ICD-10-CM | POA: Diagnosis not present

## 2021-07-15 DIAGNOSIS — R0609 Other forms of dyspnea: Secondary | ICD-10-CM

## 2021-07-15 DIAGNOSIS — M4125 Other idiopathic scoliosis, thoracolumbar region: Secondary | ICD-10-CM | POA: Diagnosis not present

## 2021-07-15 DIAGNOSIS — M47817 Spondylosis without myelopathy or radiculopathy, lumbosacral region: Secondary | ICD-10-CM | POA: Diagnosis not present

## 2021-07-15 DIAGNOSIS — Z23 Encounter for immunization: Secondary | ICD-10-CM | POA: Diagnosis not present

## 2021-07-15 LAB — NITRIC OXIDE: 11800305105: 24

## 2021-07-15 NOTE — Telephone Encounter (Signed)
I have placed a new order.

## 2021-07-15 NOTE — Telephone Encounter (Signed)
Somehow that order was completed so it never hit the [facility] to be scheduled we will need a new order

## 2021-07-15 NOTE — Patient Instructions (Addendum)
Suspect you could have hyperparathyroidism which can cause elevated calcium and PTH levels. Would discuss getting thyroid ultrasound with PCP and referral to endocrinologist   We will also recommend you see rheumatologist due to elevated positive ANA and joint pain (order was placed)   I will follow-up on echocardiogram   Orders: FENO (done, this was normal)  Follow-up: 3 months with Dr. [name]

## 2021-07-15 NOTE — Telephone Encounter (Signed)
Can we please check on echocardiogram that was ordered, this has not been scheduled

## 2021-07-15 NOTE — Progress Notes (Signed)
_0  ID: Sheri [name], female    DOB: 02-Jul-1951, 70 y.o.   MRN: [id number]  Chief Complaint  Patient presents with   Follow-up    Referring provider: [name], MD  HPI: 70 year old female, never smoked.  Past medical history significant for hypertension, multiple lung nodules, dyspnea on exertion.  Patient of Dr. [name], last seen on 05/06/2018.   No evidence of ILD on HRCT in August 2019.  Small nodules noted on imaging, ordered for repeat CT chest without contrast for September 2020. Dyspnea felt to be related to weight and diastolic dysfunction.  Recommended 14-monthfollow-up.  Previous [facility] pulmonary encounter:  01/02/2021 Presents today for an overdue follow-up pulmonary nodules/dyspnea, last seen in office in September 2019.   She had CABG in November 2021, no noticeable improvement in dyspnea symptoms since surgery.  She experiences dyspnea symptoms with moderate exertion, if she uses her walker she does not get out of breath. She is attending cardiac rehab three times a week. She is currently at a level 7 on new step for 40-50 mins. Albuterol has not helped in the past. Denies chest tightness, wheezing or cough    PFTs in 2019 showed moderate restriction without BD response and mild diffusion defect. HRCT showed no evidence of ILD, mild air trapping indicative of small airways disease.  Follow-up CT imaging in September 2020 for pulmonary nodules showed no change in appearance of multiple small bilateral pulmonary nodules.  These are compatible with benign process and no further follow-up indicated.  She had coronary artery calcifications and aortic arthrosclerosis on imaging.  H/O multiple pulmonary nodules - CT imaging in September 2020 for pulmonary nodules showed no change in appearance of multiple small bilateral pulmonary nodules.  These are compatible with benign process and no further follow-up indicated.    Dyspnea - She reports no noticeable improvement in  dyspnea symptoms since CABG.  She experiences dyspnea symptoms with moderate exertion, if she uses her walker she does not get out of breath. No improvement from PRN Albuterol Hfa.  PFTs in 2019 showed moderate restriction without BD response and mild diffusion defect. HRCT in August 2019 showed no evidence of ILD, mild air trapping indicative of small airways disease. Dyspnea previously felt to be related to weight and diastolic dysfunction. Symptoms do not appear consistent with asthma. BNP has been elevated in past. Needs labs checked with PCP (cbc with diff, BNP, ANA, RF). Recommend weight loss 20-25lbs and formal exercise regimen consisting of 319ms five times a week.     05/02/2021 -   Chief Complaint  Patient presents with   Follow-up    Pt states she have concerns. Pt states she feel horrible and is SOB. Heart attack and simple bypass in November 2021. Have brain fog and feeling like something is stuck in her throat on the left side     HPI PaYEHUDIS MONCEAUX89.o. -personally seeing her after 2019.  In the interim she saw BeDerl Barrownd aspiration in May 2022.  [place]  [place] She tells me that in November 2020 when [facility] a [facility] Her last chest x-ray November 2021 showed left basilar opacities and bilateral pleural effusions.  [place] system last chest imaging was September 2020 CT scan of  the chest that was clear without ILD.  When she saw her breath while she was doing cardiac rehab.  She tells me that despite the bypass and despite doing cardiac rehab she still has fixed dyspnea on exertion that also has variability pattern.  She gets dyspneic on an average for 50 feet and relieved by rest but sometimes the dyspnea is worse even at rest.  Sometimes she can exert a  lot more and be fine.  Definitely rehab she is better than when she is out of rehab.  She also has significant amount of arthralgia and overall fatigue.    Her TSH in November 2021 was 0.5 with a T4 of 14.3 and elevated at [facility] She was anemic and November 2021 with a hemoglobin of 9.1 g% at [place]  -In our system the last blood work was in 20 1911.5 g% Her BNP was 2000 252 proBNP at [facility] in November 2021 She gives a history of high calcium burden #2021 at [facility] health calcium was 9.5 and normal  She had a normal sed rate in 2019 in our system  She had a positive ANA 1: 80 in 2013.  Based on this in May 2022 rheumatology referral was made but she is yet to see the rheumatologist.  Unclear what is exactly driving current shortness of breath although it could be the same previous reasons of physical deconditioning obesity and diastolic dysfunction.  Also it could be that her new problems  Plan - Do CBC with differential, chemistry, liver function test, BNP, troponin -Do ANA, rheumatoid factor, ESR, CCP, double-stranded DNA, SSA, SSB, SCL-70 -Do TSH, free T4 and T3 -Do echocardiogram -Do chest x-ray two-view   Follow-up - Complete all this and return to see Derl Barrow in the next few weeks to discuss test results             -If there is anything significantly abnormal in the interim we will call you    07/15/2021- Interim hx  Patient presents today for follow-up/review recent testing. She is doing well today, no significant complaints except for ongoing dyspnea symptoms. Exertion causes her to become short winded. Walking long distance on flat surface will causes her to become fatigued. She does not get winded sitting very often.   CXR 05/02/21 showed clear lungs. Labs revealed positive ANA and sed rate was elevated. She has never been elevated by rheumatology. She does endorse joint pain. BNP was 209, waiting for echocardiogram to be scheduled. She also tells me that her calcium  levels and PTH have been elevated.   PFTs in 2019 showed moderate restriction without BD response and mild diffusion defect. HRCT in August 2019 showed no evidence of ILD, mild air trapping indicative of small airways disease.   She had her covid booster on 05/15/21 and high dose influenza vaccine end of September    05/02/21 Labs : Sed rate 58 RF negative  ANA positive  Eos 400  Sjogren's negative  BNP 209    No Known Allergies  Immunization History  Administered Date(s) Administered   Influenza, High Dose Seasonal PF 05/06/2018   Influenza-Unspecified 06/16/2017, 06/13/2020   PFIZER(Purple Top)SARS-COV-2 Vaccination 09/07/2019, 09/28/2019, 05/25/2020   Zoster Recombinat (Shingrix) 10/05/2017, 02/09/2018    Past Medical History:  Diagnosis Date   Allergic rhinitis    Degenerative disc disease, cervical    Diabetes mellitus without complication ([place])    type two   Diarrhea 04/2012   colon nl, BX NPD; no response to cholestyramin  03-2012 no response to align; transient resp to sucralfate 12-13   H/O CT scan of abdomen 08/2012   neg; egd bile reflux gastritisand linear eros; transient resp to sucralfate    H/O ulcer disease    Heart murmur    hx of " comes and goes"    History of blood transfusion    Hyperlipidemia    Hyperplastic polyps of stomach    Hypertension    Hypothyroidism    IBS (irritable bowel syndrome)    Kissing, osteophytes    Dr Carloyn Manner   Menopause    Mild anemia 06/2011   14% iron sat,ferritin 14- from Metformin - anemia resolved    Obesity    Osteoarthritis    PONV (postoperative nausea and vomiting)    years ago     Tobacco History: Social History   Tobacco Use  Smoking Status Never  Smokeless Tobacco Never   Counseling given: Not Answered   Outpatient Medications Prior to Visit  Medication Sig Dispense Refill   acetaminophen (TYLENOL) 650 MG CR tablet Take 1,300 mg by mouth 2 (two) times daily.     amLODipine-benazepril (LOTREL) 5-20 MG  per capsule Take 1 capsule by mouth every morning.     aspirin 81 MG EC tablet Take 81 mg by mouth daily.     meloxicam (MOBIC) 15 MG tablet Take 15 mg by mouth daily.     pantoprazole (PROTONIX) 40 MG tablet Take 40 mg by mouth daily.     rosuvastatin (CRESTOR) 5 MG tablet Take 5 mg by mouth 3 (three) times a week.     sitaGLIPtin (JANUVIA) 100 MG tablet Take 100 mg by mouth daily.     SYNTHROID 125 MCG tablet Take 125 mcg by mouth daily before breakfast.   3   traMADol (ULTRAM) 50 MG tablet Take 1-2 tablets (50-100 mg total) by mouth every 6 (six) hours as needed for moderate pain (if oxycodone not effective). 40 tablet 0   furosemide (LASIX) 20 MG tablet TAKE 1 TABLET(20 MG) BY MOUTH DAILY (Patient not taking: Reported on 07/15/2021) 90 tablet 3   methocarbamol (ROBAXIN) 500 MG tablet Take 1 tablet (500 mg total) by mouth every 6 (six) hours as needed for muscle spasms. (Patient not taking: Reported on 07/15/2021) 40 tablet 0   oxyCODONE (OXY IR/ROXICODONE) 5 MG immediate release tablet Take 1-3 tablets (5-15 mg total) by mouth every 6 (six) hours as needed for moderate pain (pain score 4-6). (Patient not taking: Reported on 07/15/2021) 84 tablet 0   VENTOLIN HFA 108 (90 Base) MCG/ACT inhaler INHALE 2 PUFFS INTO THE LUNGS EVERY 6 HOURS AS NEEDED FOR WHEEZING OR SHORTNESS OF BREATH (Patient not taking: No sig reported) 54 g 5   No facility-administered medications prior to visit.    Review of Systems  Review of Systems  Constitutional: Negative.   Respiratory:  Positive for shortness of breath. Negative for cough, chest tightness and wheezing.     Physical Exam  BP 126/68 (BP Location: Left Arm, Patient Position: Sitting, Cuff Size: Large)   Pulse 83   Temp 98.6 F (37 C) (Oral)   Ht _0  (1.651 m)   Wt 205 lb 3.2 oz (93.1 kg)   SpO2 98%   BMI 34.15 kg/m  Physical Exam Constitutional:      General: She is not in acute distress.    Appearance: Normal appearance. She is obese.  She is not ill-appearing.  HENT:     Head: Normocephalic and atraumatic.  Mouth/Throat:     Mouth: Mucous membranes are moist.     Pharynx: Oropharynx is clear.  Cardiovascular:     Rate and Rhythm: Normal rate and regular rhythm.     Comments: Trace LE edema  Pulmonary:     Breath sounds: No wheezing, rhonchi or rales.  Musculoskeletal:        General: Normal range of motion.  Skin:    General: Skin is warm and dry.  Neurological:     General: No focal deficit present.     Mental Status: She is alert and oriented to person, place, and time. Mental status is at baseline.  Psychiatric:        Mood and Affect: Mood normal.        Behavior: Behavior normal.        Thought Content: Thought content normal.        Judgment: Judgment normal.     Lab Results:  CBC    Component Value Date/Time   WBC 10.5 05/02/2021 1444   RBC 4.34 05/02/2021 1444   HGB 11.2 (L) 05/02/2021 1444   HGB 12.7 06/08/2013 1335   HCT 34.1 (L) 05/02/2021 1444   HCT 38.7 06/08/2013 1335   PLT 465.0 (H) 05/02/2021 1444   PLT 371 06/08/2013 1335   MCV 78.5 05/02/2021 1444   MCV 88.6 06/08/2013 1335   MCH 28.1 05/28/2018 0454   MCHC 32.8 05/02/2021 1444   RDW 17.1 (H) 05/02/2021 1444   RDW 13.3 06/08/2013 1335   LYMPHSABS 2.5 05/02/2021 1444   LYMPHSABS 2.7 06/08/2013 1335   MONOABS 1.1 (H) 05/02/2021 1444   MONOABS 1.0 (H) 06/08/2013 1335   EOSABS 0.4 05/02/2021 1444   EOSABS 0.1 06/08/2013 1335   BASOSABS 0.3 (H) 05/02/2021 1444   BASOSABS 0.1 06/08/2013 1335    BMET    Component Value Date/Time   NA 137 05/02/2021 1444   K 4.4 05/02/2021 1444   CL 101 05/02/2021 1444   CO2 26 05/02/2021 1444   GLUCOSE 101 (H) 05/02/2021 1444   BUN 20 05/02/2021 1444   CREATININE 0.94 05/02/2021 1444   CREATININE 0.95 06/16/2017 1503   CALCIUM 10.7 (H) 05/02/2021 1444   GFRNONAA >60 05/28/2018 0454   GFRNONAA 67 04/14/2017 1557   GFRAA >60 05/28/2018 0454   GFRAA 78 04/14/2017 1557    BNP No  results found for: BNP  ProBNP    Component Value Date/Time   PROBNP 209.0 (H) 05/02/2021 1444    Imaging: No results found.   Assessment & Plan:   Dyspnea - No clear underlying pulmonary cause of patient's dyspnea symptoms. HRCT in 2019 showed no evidence of ILD, she had normal CXR in September 2022. PFTs in 2019 showed moderate restriction without BD response and mild diffusion defect. FENO was normal today. I do not suspect asthma or COPD as cause of her shortness of breath. BNP was elevated, we are waiting on her echocardiogram to be scheduled. We will also be referring her to rheumatology d.t positive ANA and complaints of arthralgia. Lastly recommend she follow-up with PCP regarding elevated calcium and PTH levels, may need thyroid ultrasound and/or referral to endocrinology. Continue to work on weight loss efforts and increasing physical exercise. FU in 3 months with Dr. [name].    Martyn Ehrich, NP 07/15/2021

## 2021-07-15 NOTE — Assessment & Plan Note (Addendum)
-   No clear underlying pulmonary cause of patient's dyspnea symptoms. HRCT in 2019 showed no evidence of ILD, she had normal CXR in September 2022. PFTs in 2019 showed moderate restriction without BD response and mild diffusion defect. FENO was normal today. I do not suspect asthma or COPD as cause of her shortness of breath. BNP was elevated, we are waiting on her echocardiogram to be scheduled. We will also be referring her to rheumatology d.t positive ANA and complaints of arthralgia. Lastly recommend she follow-up with PCP regarding elevated calcium and PTH levels, may need thyroid ultrasound and/or referral to endocrinology. Continue to work on weight loss efforts and increasing physical exercise. FU in 3 months with Dr. [name].

## 2021-07-16 DIAGNOSIS — R768 Other specified abnormal immunological findings in serum: Secondary | ICD-10-CM

## 2021-07-16 DIAGNOSIS — M2550 Pain in unspecified joint: Secondary | ICD-10-CM

## 2021-07-17 NOTE — Telephone Encounter (Signed)
This is fine, please send referral to patient's preferred provider

## 2021-07-17 NOTE — Telephone Encounter (Signed)
BW please advise. Thanks  Hi [name], I am still living in [place] most of the time to help with my grandchildren.   Could you send a rheumatology referral to Dr Leavy Cella in [place] since he is much closer.  His contact number is [phone] and fax is [phone]. Dr Nicholaus Bloom, my pain management dr, recommended him as he is closer to [facility].   Thank you,  Sheri [name]

## 2021-08-14 DIAGNOSIS — J0100 Acute maxillary sinusitis, unspecified: Secondary | ICD-10-CM | POA: Diagnosis not present

## 2021-08-14 NOTE — Telephone Encounter (Signed)
Can we please let patient know, either refer here in  or somewhere closer to her

## 2021-08-14 NOTE — Telephone Encounter (Signed)
I have faxed this order to Dr Boris Lown but I have received a fax back from there office and they are unable to take on a new patient at this time due to a full schedule. Please advise.

## 2021-08-29 DIAGNOSIS — Z6834 Body mass index (BMI) 34.0-34.9, adult: Secondary | ICD-10-CM | POA: Diagnosis not present

## 2021-08-29 DIAGNOSIS — E039 Hypothyroidism, unspecified: Secondary | ICD-10-CM | POA: Diagnosis not present

## 2021-08-29 DIAGNOSIS — Z79899 Other long term (current) drug therapy: Secondary | ICD-10-CM | POA: Diagnosis not present

## 2021-08-29 DIAGNOSIS — E213 Hyperparathyroidism, unspecified: Secondary | ICD-10-CM | POA: Diagnosis not present

## 2021-09-09 DIAGNOSIS — E213 Hyperparathyroidism, unspecified: Secondary | ICD-10-CM | POA: Diagnosis not present

## 2021-10-14 ENCOUNTER — Ambulatory Visit: Payer: Medicare Other | Admitting: Internal Medicine

## 2021-10-14 ENCOUNTER — Other Ambulatory Visit (HOSPITAL_COMMUNITY): Payer: Medicare Other

## 2021-10-21 DIAGNOSIS — Z20822 Contact with and (suspected) exposure to covid-19: Secondary | ICD-10-CM | POA: Diagnosis not present

## 2021-10-28 ENCOUNTER — Other Ambulatory Visit: Payer: Self-pay

## 2021-10-28 ENCOUNTER — Ambulatory Visit: Payer: Medicare Other | Admitting: Primary Care

## 2021-10-28 ENCOUNTER — Ambulatory Visit (HOSPITAL_COMMUNITY): Payer: Medicare Other | Attending: Primary Care

## 2021-10-28 DIAGNOSIS — M47817 Spondylosis without myelopathy or radiculopathy, lumbosacral region: Secondary | ICD-10-CM | POA: Diagnosis not present

## 2021-10-28 DIAGNOSIS — R0609 Other forms of dyspnea: Secondary | ICD-10-CM | POA: Insufficient documentation

## 2021-10-28 DIAGNOSIS — M25561 Pain in right knee: Secondary | ICD-10-CM | POA: Diagnosis not present

## 2021-10-28 DIAGNOSIS — G894 Chronic pain syndrome: Secondary | ICD-10-CM | POA: Diagnosis not present

## 2021-10-28 DIAGNOSIS — M4125 Other idiopathic scoliosis, thoracolumbar region: Secondary | ICD-10-CM | POA: Diagnosis not present

## 2021-10-28 LAB — ECHOCARDIOGRAM COMPLETE
Area-P 1/2: 3.72 cm2
S' Lateral: 2.9 cm

## 2021-10-28 NOTE — Progress Notes (Signed)
Office Visit Note  Patient: Sheri [name]             Date of Birth: 1950-10-24           MRN: [id number]             PCP: [name], MD Referring: Martyn Ehrich, NP Visit Date: 10/29/2021  Subjective:   History of Present Illness: Sheri [name] is a 71 y.o. female here for evaluation of positive ANA checked associated with joint pain in multiple areas.  She has a medical history including hypertension, CAD status post CABG, diabetes, paroxysmal A-fib, and GERD. She was seen in pulmonology clinic with dyspnea on exertion exact cause unclear undergoing echo or possibly consistent with deconditioning with bilateral benign appearing pulmonary nodules. Joint pain has been a longstanding problem she had previous bilateral knee replacement surgeries right in 2013 left in 2016.  The right knee was complicated by subsequent group B streptococcus infection that was not resolved with antibiotics eventually requiring revision and new joint replacement surgery in 2019.  She has chronic low back pain attributed to osteoarthritis and degenerative disc disease with right-sided radicular symptoms.  This had discussed surgical treatment but not recommended after her extensive complications with the knee replacement.  Her worst problems now are in her bilateral hands and the low back and in the right knee.  She notices swelling in the right knee.  She currently takes tramadol and Tylenol for the chronic pain and very infrequently will take Aleve avoiding regular use due to bleeding risk. She denies any new problems with oral ulcers, cervical adenopathy, Raynaud's symptoms, photosensitive skin rashes.  She is on aspirin 81 mg no history of abnormal bleeding or clotting problems. She has had mild persistent hypercalcemia currently undergoing evaluation her daughter had parathyroidectomy in [place] for hyperparathyroidism with nodule.  She has some chronic constipation she attributes to taking  increased tramadol for back pain.  She has frequent muscle cramping or charley horse sensations in the right calf.  Labs reviewed ANA 1:80 speckled dsDNA, SSA, SSB, Scl-70 neg RF neg CCP neg ESR 58 CBC Hgb 11.2 Plts 465 BMP Calcium 10.7 LFTs wnl   Activities of Daily Living:  Patient reports morning stiffness for 30 minutes.   Patient Reports nocturnal pain.  Difficulty dressing/grooming: Denies Difficulty climbing stairs: Reports Difficulty getting out of chair: Reports Difficulty using hands for taps, buttons, cutlery, and/or writing: Reports  Review of Systems  Constitutional:  Positive for fatigue.  HENT:  Positive for mouth dryness. Negative for mouth sores and nose dryness.   Eyes:  Positive for dryness. Negative for pain and itching.  Respiratory:  Positive for shortness of breath. Negative for difficulty breathing.   Cardiovascular:  Negative for chest pain and palpitations.  Gastrointestinal:  Negative for blood in stool, constipation and diarrhea.  Endocrine: Negative for increased urination.  Genitourinary:  Negative for difficulty urinating.  Musculoskeletal:  Positive for joint pain, joint pain, joint swelling, myalgias, morning stiffness, muscle tenderness and myalgias.  Skin:  Positive for color change. Negative for rash and redness.  Allergic/Immunologic: Negative for susceptible to infections.  Neurological:  Positive for dizziness, numbness and weakness. Negative for headaches and memory loss.  Hematological:  Positive for bruising/bleeding tendency.  Psychiatric/Behavioral:  Negative for confusion.    PMFS History:  Patient Active Problem List   Diagnosis Date Noted   Positive ANA (antinuclear antibody) 10/29/2021   Bilateral carpal tunnel syndrome 10/29/2021   Bilateral hand pain 10/29/2021  H/O multiple pulmonary nodules 01/02/2021   S/P CABG x 3 08/01/2020   Paroxysmal atrial fibrillation ([place]) 07/14/2020   CAD (coronary artery disease), native  coronary artery 07/10/2020   Chest pain, unspecified 07/04/2020   Laryngopharyngeal reflux (LPR) 05/11/2018   Pharyngoesophageal dysphagia 05/11/2018   Thrombocytosis 12/28/2017   History of total knee replacement, right 10/24/2017   Lumbar spondylosis 06/22/2017   Medication monitoring encounter 06/17/2017   Vaginal candidiasis 03/02/2017   Status post peripherally inserted central catheter (PICC) central line placement 03/02/2017   Infection of total right knee replacement ([place]) 01/22/2017   Dyspnea 05/22/2016   OA (osteoarthritis) of knee 02/12/2015   Essential hypertension, benign 11/10/2013   Pure hypercholesterolemia 11/10/2013   Family history of arthritis 11/27/2011   Type 2 diabetes mellitus without complication ([place]) 08/20/7587   Anemia 08/13/2011    Past Medical History:  Diagnosis Date   Allergic rhinitis    Degenerative disc disease, cervical    Diabetes mellitus without complication ([place])    type two   Diarrhea 04/2012   colon nl, BX NPD; no response to cholestyramin 03-2012 no response to align; transient resp to sucralfate 12-13   Group B streptococcal infection    H/O CT scan of abdomen 08/2012   neg; egd bile reflux gastritisand linear eros; transient resp to sucralfate    H/O ulcer disease    Heart murmur    hx of " comes and goes"    History of blood transfusion    Hyperlipidemia    Hyperplastic polyps of stomach    Hypertension    Hypothyroidism    IBS (irritable bowel syndrome)    Kissing, osteophytes    Dr Carloyn Manner   Menopause    Mild anemia 06/2011   14% iron sat,ferritin 14- from Metformin - anemia resolved    Obesity    Osteoarthritis    PONV (postoperative nausea and vomiting)    years ago    Septic arthritis of knee, right ([place]) 05/26/2018    Family History  Problem Relation Age of Onset   Other Mother 38       sepsis of unknown etiology   Lung cancer Father 19   Diabetes Mellitus II Father    Atrial fibrillation Father    Osteoarthritis  Sister    Asthma Sister    Osteoarthritis Brother    Past Surgical History:  Procedure Laterality Date   CESAREAN SECTION     CHOLECYSTECTOMY     COLONOSCOPY  2/06,04/20/12 repeat 10 years   dr Buccini   EXCISIONAL TOTAL KNEE ARTHROPLASTY WITH ANTIBIOTIC SPACERS Right 11/23/2017   Procedure: Right knee resection arthroplasty; antibiotic spacer;  Surgeon: Gaynelle Arabian, MD;  Location: [facility];  Service: Orthopedics;  Laterality: Right;   I & D KNEE WITH POLY EXCHANGE Right 01/22/2017   Procedure: IRRIGATION AND DEBRIDEMENT KNEE WITH POLY EXCHANGE;  Surgeon: Paralee Cancel, MD;  Location: [facility];  Service: Orthopedics;  Laterality: Right;   JOINT REPLACEMENT  12/2011   right knee    REIMPLANTATION OF TOTAL KNEE Right 05/26/2018   Procedure: Right total knee arthroplasty reimplantation;  Surgeon: Gaynelle Arabian, MD;  Location: [facility];  Service: Orthopedics;  Laterality: Right;   TOTAL KNEE ARTHROPLASTY Left 02/12/2015   Procedure: LEFT TOTAL KNEE ARTHROPLASTY;  Surgeon: Gaynelle Arabian, MD;  Location: [facility];  Service: Orthopedics;  Laterality: Left;   triple bypass  07/2020   Social History   Social History Narrative   Not on file   Immunization History  Administered Date(s) Administered   Influenza, High Dose Seasonal PF 05/06/2018   Influenza-Unspecified 06/16/2017, 06/13/2020   PFIZER(Purple Top)SARS-COV-2 Vaccination 09/07/2019, 09/28/2019, 05/25/2020   Zoster Recombinat (Shingrix) 10/05/2017, 02/09/2018     Objective: Vital Signs: BP 132/80 (BP Location: Right Arm, Patient Position: Sitting, Cuff Size: Large)    Pulse 65    Ht 5' 3.5" (1.613 m)    Wt 201 lb 12.8 oz (91.5 kg)    BMI 35.19 kg/m    Physical Exam Constitutional:      Appearance: She is obese.  Eyes:     Conjunctiva/sclera: Conjunctivae normal.  Cardiovascular:     Rate and Rhythm: Normal rate and regular rhythm.     Comments: Systolic murmur Pulmonary:     Effort: Pulmonary effort is normal.     Breath  sounds: Normal breath sounds.  Lymphadenopathy:     Cervical: No cervical adenopathy.  Skin:    General: Skin is warm and dry.     Findings: No rash.     Comments: 1+ pitting edema in bilateral distal legs  Neurological:     General: No focal deficit present.     Mental Status: She is alert.     Deep Tendon Reflexes: Reflexes normal.  Psychiatric:        Mood and Affect: Mood normal.     Musculoskeletal Exam:  Neck full ROM no tenderness Shoulders full ROM right shoulder posterior tenderness with external rotation Elbows decreased extension ROM bilaterally no tenderness or swelling Wrists full ROM no swelling mild tenderness to pressure Fingers bilateral thumbs CMC joint squaring and MCP hyperextension Diffuse tenderness to palpation over spine throughout Right knee enlarged, lateral deviation, no palpable effusion, left knee full ROM without pain or swelling Ankles full ROM no tenderness or swelling  Limited ultrasound inspection of bilateral wrists showing median nerve enlargement right side CSA 15 mm^2 and left side CSA 19 m^2  Investigation: No additional findings.  Imaging: ECHOCARDIOGRAM COMPLETE  Result Date: 10/28/2021    ECHOCARDIOGRAM REPORT   Patient Name:   ROVENA HEARLD Date of Exam: 10/28/2021 Medical Rec #:  [id number]           Height:       65.0 in Accession #:    [id number]          Weight:       205.2 lb Date of Birth:  1950/10/17            BSA:          2.000 m Patient Age:    1 years            BP:           164/92 mmHg Patient Gender: F                   HR:           70 bpm. Exam Location:  [place] Procedure: 2D Echo, Cardiac Doppler, Color Doppler and Strain Analysis Indications:    R06.00 Dyspnea  History:        Patient has prior history of Echocardiogram examinations, most                 recent 01/28/2018. Risk Factors:Hypertension and Diabetes.  Sonographer:    Marygrace Drought RCS Referring Phys: [id number] [place]  1. Left  ventricular ejection fraction, by estimation, is 60 to 65%. The left ventricle has normal function. The left ventricle has no regional  wall motion abnormalities. Left ventricular diastolic parameters were normal.  2. Right ventricular systolic function is normal. The right ventricular size is normal. There is normal pulmonary artery systolic pressure.  3. The mitral valve is normal in structure. Trivial mitral valve regurgitation. No evidence of mitral stenosis.  4. The aortic valve is tricuspid. Aortic valve regurgitation is not visualized. No aortic stenosis is present.  5. The inferior vena cava is normal in size with greater than 50% respiratory variability, suggesting right atrial pressure of 3 mmHg. FINDINGS  Left Ventricle: Left ventricular ejection fraction, by estimation, is 60 to 65%. The left ventricle has normal function. The left ventricle has no regional wall motion abnormalities. The left ventricular internal cavity size was normal in size. There is  no left ventricular hypertrophy. Left ventricular diastolic parameters were normal. Right Ventricle: The right ventricular size is normal. Right ventricular systolic function is normal. There is normal pulmonary artery systolic pressure. The tricuspid regurgitant velocity is 2.12 m/s, and with an assumed right atrial pressure of 3 mmHg,  the estimated right ventricular systolic pressure is 22.5 mmHg. Left Atrium: Left atrial size was normal in size. Right Atrium: Right atrial size was normal in size. Pericardium: There is no evidence of pericardial effusion. Mitral Valve: The mitral valve is normal in structure. Trivial mitral valve regurgitation. No evidence of mitral valve stenosis. Tricuspid Valve: The tricuspid valve is normal in structure. Tricuspid valve regurgitation is trivial. No evidence of tricuspid stenosis. Aortic Valve: The aortic valve is tricuspid. Aortic valve regurgitation is not visualized. No aortic stenosis is present. Pulmonic Valve:  The pulmonic valve was normal in structure. Pulmonic valve regurgitation is trivial. No evidence of pulmonic stenosis. Aorta: The aortic root is normal in size and structure. Venous: The inferior vena cava is normal in size with greater than 50% respiratory variability, suggesting right atrial pressure of 3 mmHg. IAS/Shunts: No atrial level shunt detected by color flow Doppler.  LEFT VENTRICLE PLAX 2D LVIDd:         4.40 cm   Diastology LVIDs:         2.90 cm   LV e' medial:    9.68 cm/s LV PW:         1.10 cm   LV E/e' medial:  11.0 LV IVS:        1.10 cm   LV e' lateral:   13.50 cm/s LVOT diam:     2.00 cm   LV E/e' lateral: 7.9 LV SV:         68 LV SV Index:   34        2D Longitudinal Strain LVOT Area:     3.14 cm  2D Strain GLS (A2C):   -23.6 %                          2D Strain GLS (A3C):   -17.9 %                          2D Strain GLS (A4C):   -16.0 %                          2D Strain GLS Avg:     -19.2 % RIGHT VENTRICLE RV Basal diam:  3.40 cm RV S prime:     10.80 cm/s TAPSE (M-mode): 1.9 cm RVSP:  21.0 mmHg LEFT ATRIUM             Index        RIGHT ATRIUM           Index LA diam:        3.80 cm 1.90 cm/m   RA Pressure: 3.00 mmHg LA Vol (A2C):   43.3 ml 21.65 ml/m  RA Area:     12.30 cm LA Vol (A4C):   38.3 ml 19.15 ml/m  RA Volume:   27.90 ml  13.95 ml/m LA Biplane Vol: 41.6 ml 20.80 ml/m  AORTIC VALVE LVOT Vmax:   96.60 cm/s LVOT Vmean:  66.400 cm/s LVOT VTI:    0.217 m  AORTA Ao Root diam: 2.80 cm Ao Asc diam:  3.60 cm MITRAL VALVE                TRICUSPID VALVE MV Area (PHT):              TR Peak grad:   18.0 mmHg MV Decel Time:              TR Vmax:        212.00 cm/s MV E velocity: 106.00 cm/s  Estimated RAP:  3.00 mmHg MV A velocity: 101.45 cm/s  RVSP:           21.0 mmHg MV E/A ratio:  1.04                             SHUNTS                             Systemic VTI:  0.22 m                             Systemic Diam: 2.00 cm Kirk Ruths MD Electronically signed by Kirk Ruths  MD Signature Date/Time: 10/28/2021/12:30:40 PM    Final    XR Hand 2 View Left  Result Date: 10/29/2021 X-ray left hand 2 views Radiocarpal joint space with slight narrowing small calcification present in the ulnocarpal space.  There is multiple sclerotic changes of the lunate with narrowing. Severe 1st CMC joint degenerative changes with subluxation.  MCP and PIP joint spaces appear normal.  There are lateral osteophyte or enthesophytes at second and third DIPs.  No erosions are seen in bone mineralization indicated generalized osteopenia. Impression Severe OA of the first CMC joint, mild DIP joint disease, chronic appearing changes in the scaphoid and lunate suggestive for posttraumatic OA  XR Hand 2 View Right  Result Date: 10/29/2021 X-ray right hand 2 views Mild radiocarpal joint space narrowing and sclerosis, cystic changes in ulnar styloid.  There is advanced first [facility] joint degenerative changes with extensive subluxation and MCP joint hyperextension.  Bony enlargement of the third MCP head with few cystic changes slight lateral joint space narrowing.  PIP joints pretty normal second DIP slight deviation.  No erosions of bone mineralization indicates generalized osteopenia. Impression Severe first CMC joint osteoarthritis, third MCP enlargement appears most consistent with chronic degenerative change   Recent Labs: Lab Results  Component Value Date   WBC 10.5 05/02/2021   HGB 11.2 (L) 05/02/2021   PLT 465.0 (H) 05/02/2021   NA 137 05/02/2021   K 4.4 05/02/2021   CL 101 05/02/2021   CO2 26 05/02/2021   GLUCOSE 101 (H)  05/02/2021   BUN 20 05/02/2021   CREATININE 0.94 05/02/2021   BILITOT 0.4 05/02/2021   ALKPHOS 95 05/02/2021   AST 15 05/02/2021   ALT 12 05/02/2021   PROT 7.4 05/02/2021   ALBUMIN 4.4 05/02/2021   CALCIUM 10.7 (H) 05/02/2021   GFRAA >60 05/28/2018    Speciality Comments: No specialty comments available.  Procedures:  No procedures performed Allergies: Patient  has no known allergies.   Assessment / Plan:     Visit Diagnoses: Positive ANA (antinuclear antibody) - Plan: Sedimentation rate, Angiotensin converting enzyme  Positive ANA initial specific antibody markers were also negative I do not see any obvious inflammatory changes on exam today.  We will repeat sed rate which was elevated at 58 on prior lab.  Also checking ACE level with history of hypercalcemia pulmonary nodules diffuse joint pain fatigue and positive ANA though low suspicion and may be affected by concurrent benazepril.   Bilateral carpal tunnel syndrome  Bilateral hand numbness appears very consistent with carpal tunnel syndrome.  She had previous injection treatment about 5 or 6 years ago.  Ultrasound exam of median nerve cross-sectional area is highly indicative for swelling she is interested in following up for corticosteroid injection in both wrists.  H/O multiple pulmonary nodules  I do not see any evidence of systemic inflammatory disease that would obviously be related to her pulmonary nodules at this time.  Lumbar spondylosis  Chronic low back pain with known osteoarthritis and degenerative disc disease.  She is not thought to be a good procedural candidate currently reasonably controlled on tramadol.  Bilateral hand pain - Plan: XR Hand 2 View Right, XR Hand 2 View Left  Bilateral hand pain joint changes appear consistent with primary osteoarthritis.  X-ray of both hands looks consistent with this although the left wrist has changes in the lunate and base of the scaphoid suggestive for posttraumatic changes.  Orders: Orders Placed This Encounter  Procedures   XR Hand 2 View Right   XR Hand 2 View Left   Sedimentation rate   Angiotensin converting enzyme   No orders of the defined types were placed in this encounter.   Follow-Up Instructions: Return in about 2 weeks (around 11/12/2021) for New pt +ANA/OA b/l CTS f/u 2 wks.   Collier Salina, MD  Note - This  record has been created using [employer].  Chart creation errors have been sought, but may not always  have been located. Such creation errors do not reflect on  the standard of medical care.

## 2021-10-29 ENCOUNTER — Ambulatory Visit: Payer: Self-pay

## 2021-10-29 ENCOUNTER — Encounter: Payer: Self-pay | Admitting: Internal Medicine

## 2021-10-29 ENCOUNTER — Ambulatory Visit (INDEPENDENT_AMBULATORY_CARE_PROVIDER_SITE_OTHER): Payer: Medicare Other | Admitting: Internal Medicine

## 2021-10-29 VITALS — BP 132/80 | HR 65 | Ht 63.5 in | Wt 201.8 lb

## 2021-10-29 DIAGNOSIS — M79641 Pain in right hand: Secondary | ICD-10-CM

## 2021-10-29 DIAGNOSIS — G5603 Carpal tunnel syndrome, bilateral upper limbs: Secondary | ICD-10-CM

## 2021-10-29 DIAGNOSIS — M47816 Spondylosis without myelopathy or radiculopathy, lumbar region: Secondary | ICD-10-CM

## 2021-10-29 DIAGNOSIS — M79642 Pain in left hand: Secondary | ICD-10-CM

## 2021-10-29 DIAGNOSIS — Z87898 Personal history of other specified conditions: Secondary | ICD-10-CM | POA: Diagnosis not present

## 2021-10-29 DIAGNOSIS — R768 Other specified abnormal immunological findings in serum: Secondary | ICD-10-CM | POA: Diagnosis not present

## 2021-10-30 LAB — ANGIOTENSIN CONVERTING ENZYME: Angiotensin-Converting Enzyme: 5 U/L — ABNORMAL LOW (ref 9–67)

## 2021-10-30 LAB — SEDIMENTATION RATE: Sed Rate: 14 mm/h (ref 0–30)

## 2021-10-30 NOTE — Progress Notes (Signed)
Lab results look fine her inflammatory numbers are back to normal and xrays show only osteoarthritis damage no evidence of RA. Her left wrist appears to have some change that looks like an old injury. We can follow up as planned for wrist injection for carpal tunnel syndrome if she keeps having these symptoms.

## 2021-11-10 DIAGNOSIS — N390 Urinary tract infection, site not specified: Secondary | ICD-10-CM | POA: Diagnosis not present

## 2021-11-10 DIAGNOSIS — R350 Frequency of micturition: Secondary | ICD-10-CM | POA: Diagnosis not present

## 2021-11-27 DIAGNOSIS — Z20822 Contact with and (suspected) exposure to covid-19: Secondary | ICD-10-CM | POA: Diagnosis not present

## 2021-11-29 DIAGNOSIS — I25118 Atherosclerotic heart disease of native coronary artery with other forms of angina pectoris: Secondary | ICD-10-CM | POA: Diagnosis not present

## 2021-11-29 DIAGNOSIS — I480 Paroxysmal atrial fibrillation: Secondary | ICD-10-CM | POA: Diagnosis not present

## 2021-11-29 DIAGNOSIS — I10 Essential (primary) hypertension: Secondary | ICD-10-CM | POA: Diagnosis not present

## 2021-11-29 DIAGNOSIS — E7800 Pure hypercholesterolemia, unspecified: Secondary | ICD-10-CM | POA: Diagnosis not present

## 2021-12-09 IMAGING — DX DG CHEST 2V
2 series · 2 of 2 positions shown · non-contrast
Comparison: 01/02/2021

CLINICAL DATA: Dyspnea on exertion

EXAM:
CHEST - 2 VIEW

[chest pa]
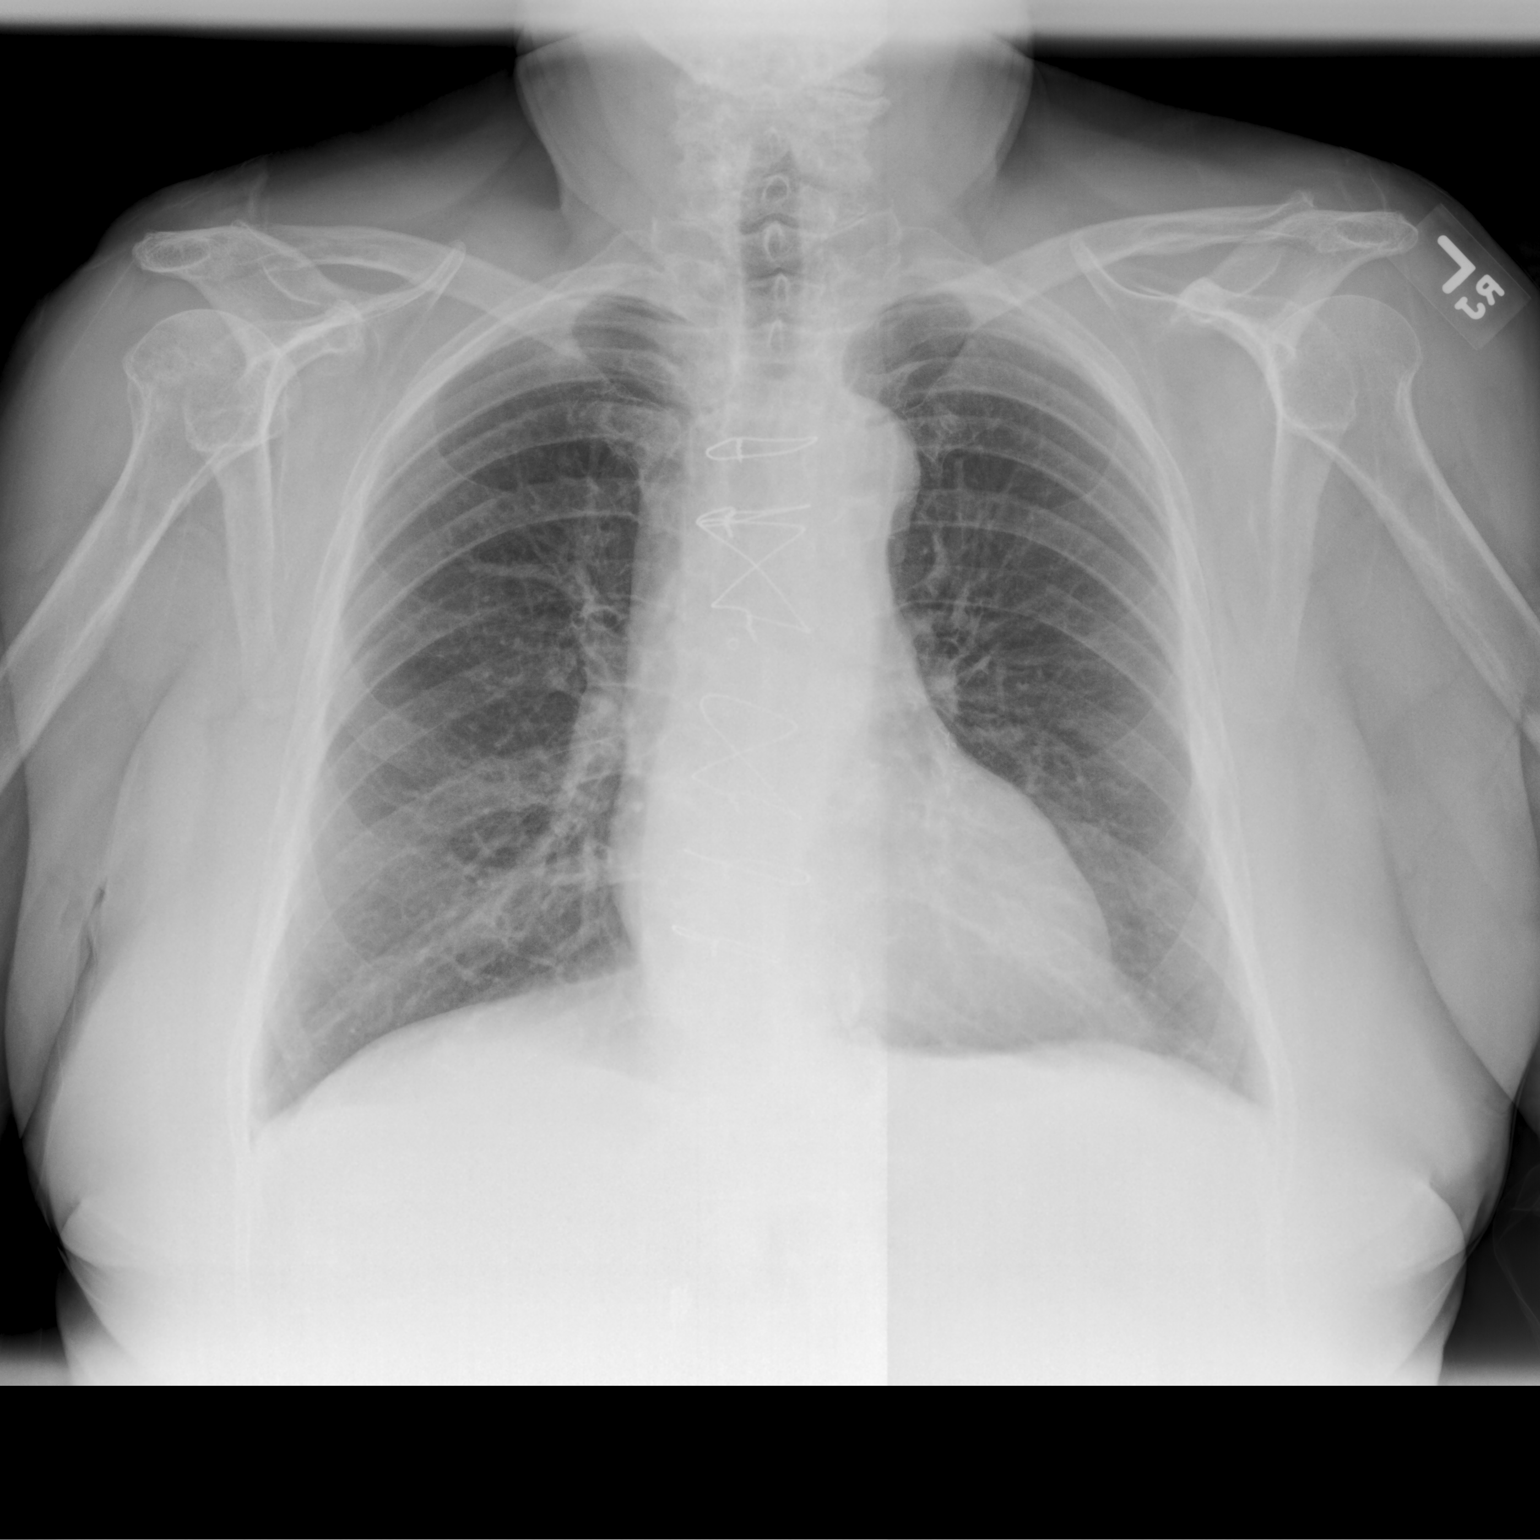

[chest lat]
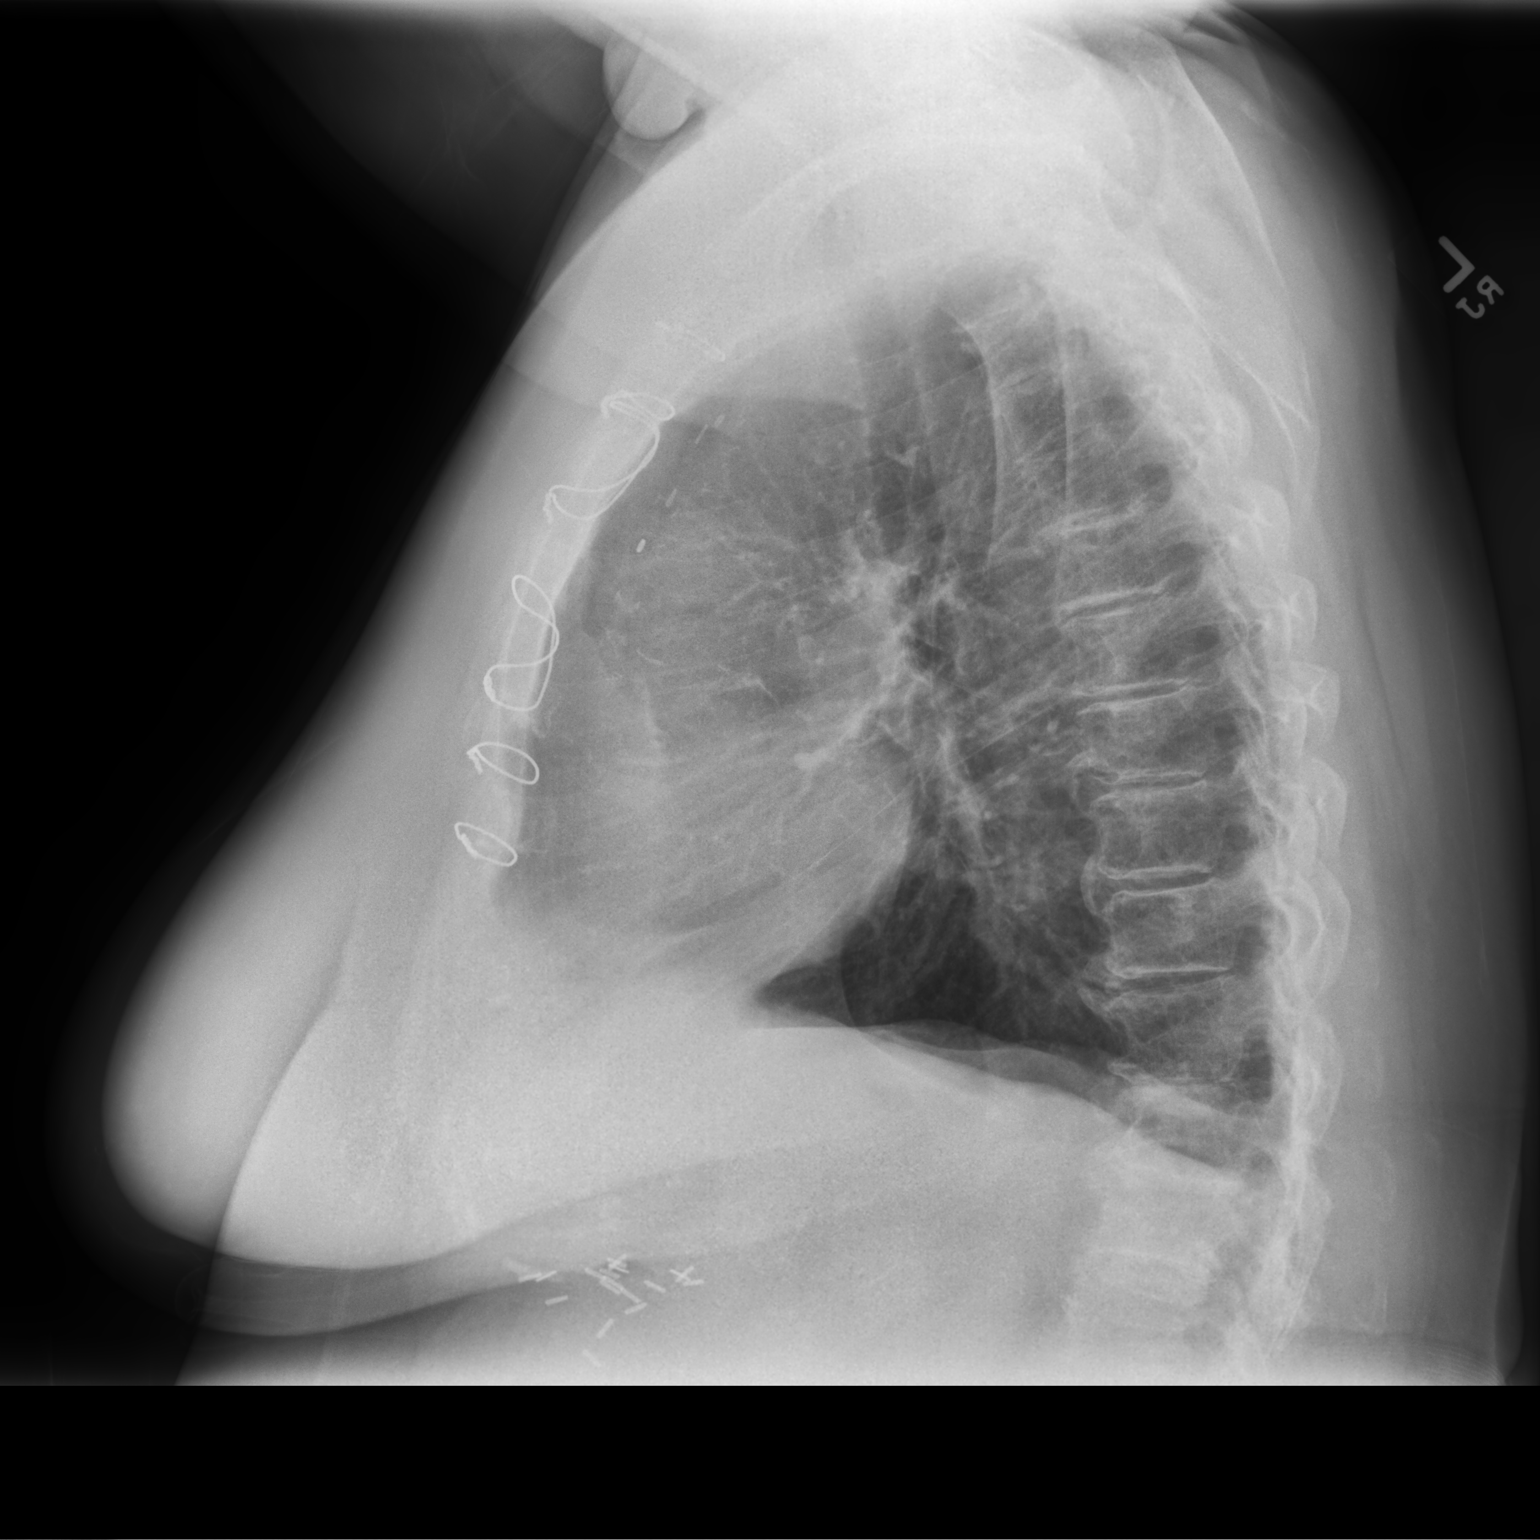

[2 of 2 positions shown; findings below may reference images not displayed]

FINDINGS: Postop open heart surgery. Heart size and vascularity normal.
Negative for heart failure. Lungs are clear and well aerated. No
infiltrate or effusion. No interval change.
IMPRESSION: No active cardiopulmonary disease.

## 2021-12-13 ENCOUNTER — Ambulatory Visit: Payer: Medicare Other | Admitting: Internal Medicine

## 2021-12-13 ENCOUNTER — Ambulatory Visit: Payer: Medicare Other | Admitting: Nurse Practitioner

## 2021-12-18 DIAGNOSIS — Z20822 Contact with and (suspected) exposure to covid-19: Secondary | ICD-10-CM | POA: Diagnosis not present

## 2021-12-25 DIAGNOSIS — R5383 Other fatigue: Secondary | ICD-10-CM | POA: Diagnosis not present

## 2021-12-25 DIAGNOSIS — N390 Urinary tract infection, site not specified: Secondary | ICD-10-CM | POA: Diagnosis not present

## 2022-01-03 DIAGNOSIS — Z20822 Contact with and (suspected) exposure to covid-19: Secondary | ICD-10-CM | POA: Diagnosis not present

## 2022-01-03 DIAGNOSIS — E213 Hyperparathyroidism, unspecified: Secondary | ICD-10-CM | POA: Diagnosis not present

## 2022-01-06 DIAGNOSIS — R339 Retention of urine, unspecified: Secondary | ICD-10-CM | POA: Diagnosis not present

## 2022-01-06 DIAGNOSIS — N390 Urinary tract infection, site not specified: Secondary | ICD-10-CM | POA: Diagnosis not present

## 2022-01-06 DIAGNOSIS — N3000 Acute cystitis without hematuria: Secondary | ICD-10-CM | POA: Diagnosis not present

## 2022-01-10 DIAGNOSIS — U071 COVID-19: Secondary | ICD-10-CM | POA: Diagnosis not present

## 2022-01-31 ENCOUNTER — Ambulatory Visit (INDEPENDENT_AMBULATORY_CARE_PROVIDER_SITE_OTHER): Payer: Medicare Other | Admitting: Nurse Practitioner

## 2022-01-31 ENCOUNTER — Encounter: Payer: Self-pay | Admitting: Nurse Practitioner

## 2022-01-31 DIAGNOSIS — E669 Obesity, unspecified: Secondary | ICD-10-CM | POA: Insufficient documentation

## 2022-01-31 DIAGNOSIS — Z6836 Body mass index (BMI) 36.0-36.9, adult: Secondary | ICD-10-CM | POA: Diagnosis not present

## 2022-01-31 DIAGNOSIS — M8580 Other specified disorders of bone density and structure, unspecified site: Secondary | ICD-10-CM | POA: Diagnosis not present

## 2022-01-31 DIAGNOSIS — M4125 Other idiopathic scoliosis, thoracolumbar region: Secondary | ICD-10-CM | POA: Diagnosis not present

## 2022-01-31 DIAGNOSIS — Z0000 Encounter for general adult medical examination without abnormal findings: Secondary | ICD-10-CM | POA: Diagnosis not present

## 2022-01-31 DIAGNOSIS — E6609 Other obesity due to excess calories: Secondary | ICD-10-CM

## 2022-01-31 DIAGNOSIS — R768 Other specified abnormal immunological findings in serum: Secondary | ICD-10-CM

## 2022-01-31 DIAGNOSIS — H35033 Hypertensive retinopathy, bilateral: Secondary | ICD-10-CM | POA: Diagnosis not present

## 2022-01-31 DIAGNOSIS — I2510 Atherosclerotic heart disease of native coronary artery without angina pectoris: Secondary | ICD-10-CM | POA: Diagnosis not present

## 2022-01-31 DIAGNOSIS — Z8619 Personal history of other infectious and parasitic diseases: Secondary | ICD-10-CM | POA: Diagnosis not present

## 2022-01-31 DIAGNOSIS — R0609 Other forms of dyspnea: Secondary | ICD-10-CM

## 2022-01-31 DIAGNOSIS — M47817 Spondylosis without myelopathy or radiculopathy, lumbosacral region: Secondary | ICD-10-CM | POA: Diagnosis not present

## 2022-01-31 DIAGNOSIS — G894 Chronic pain syndrome: Secondary | ICD-10-CM | POA: Diagnosis not present

## 2022-01-31 DIAGNOSIS — E039 Hypothyroidism, unspecified: Secondary | ICD-10-CM | POA: Diagnosis not present

## 2022-01-31 DIAGNOSIS — Z1211 Encounter for screening for malignant neoplasm of colon: Secondary | ICD-10-CM | POA: Diagnosis not present

## 2022-01-31 DIAGNOSIS — M25561 Pain in right knee: Secondary | ICD-10-CM | POA: Diagnosis not present

## 2022-01-31 DIAGNOSIS — N390 Urinary tract infection, site not specified: Secondary | ICD-10-CM | POA: Diagnosis not present

## 2022-01-31 DIAGNOSIS — D509 Iron deficiency anemia, unspecified: Secondary | ICD-10-CM | POA: Diagnosis not present

## 2022-01-31 DIAGNOSIS — E1169 Type 2 diabetes mellitus with other specified complication: Secondary | ICD-10-CM | POA: Diagnosis not present

## 2022-01-31 DIAGNOSIS — E785 Hyperlipidemia, unspecified: Secondary | ICD-10-CM | POA: Diagnosis not present

## 2022-01-31 NOTE — Assessment & Plan Note (Addendum)
Suspected to be an autoimmune inflammatory process of sorts per pt. No ILD on previous HRCT and no change in DOE. Follow up with Dr. Benjamine Mola as scheduled.

## 2022-01-31 NOTE — Progress Notes (Signed)
'@Patient'  ID: Sheri [name], female    DOB: 1951/04/10, 71 y.o.   MRN: [id number]  Chief Complaint  Patient presents with   Follow-up    Pt is here for past due follow up. Last saw BW. Pt states she is still getting short of breath when walking. Pt states the Albuterol helps sometimes. Pt just started cipro today for a UTI. Pt states no oxygen noted.     Referring provider: [name], MD  HPI: 71 year old female, never smoker followed for DOE.  She is a patient of Sheri [name] and last seen in office on 07/15/2021 by Sheri Napoleon NP.  Previously followed multiple lung nodules which were stable and considered benign with no further follow-up indicated.  Past medical history significant for hypertension, CAD status post CABG, PAF, GERD, DM2, OA.  She does have a past history of positive ANA and was referred to rheumatology with [name].  She was advised that given the waxing and waning of her symptoms she likely has an underlying autoimmune inflammatory disease; however she does not have SLE or RA.    No evidence of ILD on HRCT from August 2019.  Previously felt as though her DOE was related to weight and diastolic dysfunction.  TEST/EVENTS:  04/05/2018 HRCT chest: Atherosclerosis and CAD.  No LAD is present.  No evidence of ILD.  There was some very mild air trapping indicative of small airways disease.  There were 2-3 pulmonary nodules scattered throughout the lungs, likely benign. 05/27/2019 CT chest without contrast: There is aortic atherosclerosis and CAD.  No LAD is present.  There are numerous small pulmonary nodules scattered throughout both lungs, all measure less than 5 mm and considered benign.  Lungs otherwise clear with no acute process. 05/02/2021 CXR 2 view: Lungs are clear.  No acute process noted.  07/15/2021: [name] with Sheri Napoleon NP.  Doing well with no significant complaints except for ongoing dyspnea symptoms.  Recent work-up with positive ANA and elevated sed rate.  Upcoming  appointment scheduled with rheumatology.  No evidence of ILD on previous imaging and PFTs from 2019 showed a moderate restriction without BD and mild diffusion defect, suspected to be related to obesity.  She did also have some evidence of air trapping; Fino was checked and was normal.  Discussed that there is no clear underlying pulmonary cause of her DOE.  Encouraged that she work on weight loss efforts and increasing physical exercise.  01/31/2022: Today-follow-up Patient presents today for overdue follow-up.  Since we saw her last she was evaluated by Sheri [name] with rheumatology.  Her inflammatory markers had returned to normal and she had no evidence of RA.  Suspected that her symptoms were possibly related to autoimmune inflammatory process.  Today, she reports that her breathing is overall stable.  She has no concerns or complaints and overall is feeling relatively well.  Rarely uses albuterol.  Cardiac symptoms are stable as well.  [place] are planning to move to [name] as this is where her children and grandchildren are.  She plans to keep all of her doctors here.   No Known Allergies  Immunization History  Administered Date(s) Administered   Influenza, High Dose Seasonal PF 05/06/2018   Influenza-Unspecified 06/16/2017, 06/13/2020   PFIZER(Purple Top)SARS-COV-2 Vaccination 09/07/2019, 09/28/2019, 05/25/2020   Zoster Recombinat (Shingrix) 10/05/2017, 02/09/2018    Past Medical History:  Diagnosis Date   Allergic rhinitis    Degenerative disc disease, cervical    Diabetes mellitus without complication ([name])  type two   Diarrhea 04/2012   colon nl, BX NPD; no response to cholestyramin 03-2012 no response to align; transient resp to sucralfate 12-13   Group B streptococcal infection    H/O CT scan of abdomen 08/2012   neg; egd bile reflux gastritisand linear eros; transient resp to sucralfate    H/O ulcer disease    Heart murmur    hx of " comes and goes"    History of blood  transfusion    Hyperlipidemia    Hyperplastic polyps of stomach    Hypertension    Hypothyroidism    IBS (irritable bowel syndrome)    Kissing, osteophytes    [name]   Menopause    Mild anemia 06/2011   14% iron sat,ferritin 14- from Metformin - anemia resolved    Obesity    Osteoarthritis    PONV (postoperative nausea and vomiting)    years ago    Septic arthritis of knee, right ([place]) 05/26/2018    Tobacco History: Social History   Tobacco Use  Smoking Status Never   Passive exposure: Past  Smokeless Tobacco Never   Counseling given: Not Answered   Outpatient Medications Prior to Visit  Medication Sig Dispense Refill   acetaminophen (TYLENOL) 650 MG CR tablet Take 1,300 mg by mouth as needed.     amLODipine-benazepril (LOTREL) 5-40 MG capsule Take 1 capsule by mouth daily.     aspirin 81 MG EC tablet Take 81 mg by mouth daily.     furosemide (LASIX) 20 MG tablet TAKE 1 TABLET(20 MG) BY MOUTH DAILY (Patient taking differently: as needed.) 90 tablet 3   levothyroxine (SYNTHROID) 112 MCG tablet Take 112 mcg by mouth daily before breakfast.     meloxicam (MOBIC) 15 MG tablet Take 15 mg by mouth daily.     pantoprazole (PROTONIX) 40 MG tablet Take 40 mg by mouth daily.     rosuvastatin (CRESTOR) 5 MG tablet Take 5 mg by mouth 3 (three) times a week.     sitaGLIPtin (JANUVIA) 100 MG tablet Take 100 mg by mouth daily.     SYNTHROID 125 MCG tablet Take 125 mcg by mouth daily before breakfast.  3   traMADol (ULTRAM) 50 MG tablet Take 1-2 tablets (50-100 mg total) by mouth every 6 (six) hours as needed for moderate pain (if oxycodone not effective). 40 tablet 0   VENTOLIN HFA 108 (90 Base) MCG/ACT inhaler INHALE 2 PUFFS INTO THE LUNGS EVERY 6 HOURS AS NEEDED FOR WHEEZING OR SHORTNESS OF BREATH 54 g 5   methocarbamol (ROBAXIN) 500 MG tablet Take 1 tablet (500 mg total) by mouth every 6 (six) hours as needed for muscle spasms. 40 tablet 0   amLODipine-benazepril (LOTREL) 5-20 MG per  capsule Take 1 capsule by mouth every morning. (Patient not taking: Reported on 10/29/2021)     No facility-administered medications prior to visit.     Review of Systems:   Constitutional: No weight loss or gain, night sweats, fevers, chills. + Chronic fatigue HEENT: No headaches, difficulty swallowing, tooth/dental problems, or sore throat. No sneezing, itching, ear ache, nasal congestion, or post nasal drip CV:  No chest pain, orthopnea, PND, swelling in lower extremities, anasarca, dizziness, palpitations, syncope Resp: +shortness of breath with strenuous exertion  (baseline). No excess mucus or change in color of mucus. No productive or non-productive. No hemoptysis. No wheezing.  No chest wall deformity GI:  No heartburn, indigestion, abdominal pain, nausea, vomiting, diarrhea, change in bowel habits, loss  of appetite, bloody stools.  GU: No dysuria, change in color of urine, urgency or frequency.  No flank pain, no hematuria  Skin: No rash, lesions, ulcerations MSK:  + Myalgias/arthralgias.  No joint swelling.  No decreased range of motion.  No back pain. Neuro: No dizziness or lightheadedness.  Psych: No depression or anxiety. Mood stable.     Physical Exam:  BP 130/68 (BP Location: Right Arm, Patient Position: Sitting, Cuff Size: Normal)   Pulse 74   Temp 98.5 F (36.9 C) (Oral)   Ht '5\' 3"'  (1.6 m)   Wt 205 lb 6.4 oz (93.2 kg)   SpO2 99%   BMI 36.38 kg/m   GEN: Pleasant, interactive, chronically-ill appearing; obese; in no acute distress. HEENT:  Normocephalic and atraumatic. PERRLA. Sclera white. Nasal turbinates pink, moist and patent bilaterally. No rhinorrhea present. Oropharynx pink and moist, without exudate or edema. No lesions, ulcerations, or postnasal drip.  NECK:  Supple w/ fair ROM. No JVD present. Normal carotid impulses w/o bruits. Thyroid symmetrical with no goiter or nodules palpated. No lymphadenopathy.   CV: RRR, no m/r/g, no peripheral edema. Pulses  intact, +2 bilaterally. No cyanosis, pallor or clubbing. PULMONARY:  Unlabored, regular breathing. Clear bilaterally A&P w/o wheezes/rales/rhonchi. No accessory muscle use. No dullness to percussion. GI: BS present and normoactive. Soft, non-tender to palpation. No organomegaly or masses detected. No CVA tenderness. MSK: No erythema, warmth or tenderness. Cap refil <2 sec all extrem. No deformities or joint swelling noted.  Neuro: A/Ox3. No focal deficits noted.   Skin: Warm, no lesions or rashe Psych: Normal affect and behavior. Judgement and thought content appropriate.     Lab Results:  CBC    Component Value Date/Time   WBC 10.5 05/02/2021 1444   RBC 4.34 05/02/2021 1444   HGB 11.2 (L) 05/02/2021 1444   HGB 12.7 06/08/2013 1335   HCT 34.1 (L) 05/02/2021 1444   HCT 38.7 06/08/2013 1335   PLT 465.0 (H) 05/02/2021 1444   PLT 371 06/08/2013 1335   MCV 78.5 05/02/2021 1444   MCV 88.6 06/08/2013 1335   MCH 28.1 05/28/2018 0454   MCHC 32.8 05/02/2021 1444   RDW 17.1 (H) 05/02/2021 1444   RDW 13.3 06/08/2013 1335   LYMPHSABS 2.5 05/02/2021 1444   LYMPHSABS 2.7 06/08/2013 1335   MONOABS 1.1 (H) 05/02/2021 1444   MONOABS 1.0 (H) 06/08/2013 1335   EOSABS 0.4 05/02/2021 1444   EOSABS 0.1 06/08/2013 1335   BASOSABS 0.3 (H) 05/02/2021 1444   BASOSABS 0.1 06/08/2013 1335    BMET    Component Value Date/Time   NA 137 05/02/2021 1444   K 4.4 05/02/2021 1444   CL 101 05/02/2021 1444   CO2 26 05/02/2021 1444   GLUCOSE 101 (H) 05/02/2021 1444   BUN 20 05/02/2021 1444   CREATININE 0.94 05/02/2021 1444   CREATININE 0.95 06/16/2017 1503   CALCIUM 10.7 (H) 05/02/2021 1444   GFRNONAA >60 05/28/2018 0454   GFRNONAA 67 04/14/2017 1557   GFRAA >60 05/28/2018 0454   GFRAA 78 04/14/2017 1557    BNP No results found for: BNP   Imaging:  No results found.       Latest Ref Rng & Units 03/17/2018    9:34 AM  PFT Results  FVC-Pre L 2.20    FVC-Predicted Pre % 72    FVC-Post L  2.07    FVC-Predicted Post % 68    Pre FEV1/FVC % % 83    Post FEV1/FCV % %  81    FEV1-Pre L 1.83    FEV1-Predicted Pre % 79    FEV1-Post L 1.68    DLCO uncorrected ml/min/mmHg 16.12    DLCO UNC% % 66    DLVA Predicted % 89      No results found for: NITRICOXIDE      Assessment & Plan:   Dyspnea No specific underlying pulmonary etiology. Previous workup has been unrevealing. She is feeling well and has no concerns or complaints today. Recommended continuing with weight loss measures and exercise. Continue prn albuterol.  Patient Instructions  Continue Albuterol inhaler 2 puffs every 6 hours as needed for shortness of breath or wheezing. Notify if symptoms persist despite rescue inhaler/neb use.  Nice to meet you today!  Follow up with Sheri. [name] in 6 months. If symptoms do not improve or worsen, please contact office for sooner follow up or seek emergency care.    Obesity Suspect deconditioning and obesity are major contributing factors. She is feeling well today and feels like her DOE is stable. See above plan.  Positive ANA (antinuclear antibody) Suspected to be an autoimmune inflammatory process of sorts per pt. No ILD on previous HRCT and no change in DOE. Follow up with [name] as scheduled.    I spent 28 minutes of dedicated to the care of this patient on the date of this encounter to include pre-visit review of records, face-to-face time with the patient discussing conditions above, post visit ordering of testing, clinical documentation with the electronic health record, making appropriate referrals as documented, and communicating necessary findings to members of the patients care team.  Clayton Bibles, NP 01/31/2022  Pt aware and understands NP's role.

## 2022-01-31 NOTE — Patient Instructions (Signed)
Continue Albuterol inhaler 2 puffs every 6 hours as needed for shortness of breath or wheezing. Notify if symptoms persist despite rescue inhaler/neb use.  Nice to meet you today!  Follow up with Dr. [name] in 6 months. If symptoms do not improve or worsen, please contact office for sooner follow up or seek emergency care.

## 2022-01-31 NOTE — Assessment & Plan Note (Signed)
No specific underlying pulmonary etiology. Previous workup has been unrevealing. She is feeling well and has no concerns or complaints today. Recommended continuing with weight loss measures and exercise. Continue prn albuterol.  Patient Instructions  Continue Albuterol inhaler 2 puffs every 6 hours as needed for shortness of breath or wheezing. Notify if symptoms persist despite rescue inhaler/neb use.  Nice to meet you today!  Follow up with Dr. [name] in 6 months. If symptoms do not improve or worsen, please contact office for sooner follow up or seek emergency care.

## 2022-01-31 NOTE — Assessment & Plan Note (Signed)
Suspect deconditioning and obesity are major contributing factors. She is feeling well today and feels like her DOE is stable. See above plan.

## 2022-02-27 DIAGNOSIS — N390 Urinary tract infection, site not specified: Secondary | ICD-10-CM | POA: Diagnosis not present

## 2022-04-02 DIAGNOSIS — E7800 Pure hypercholesterolemia, unspecified: Secondary | ICD-10-CM | POA: Diagnosis not present

## 2022-04-02 DIAGNOSIS — E119 Type 2 diabetes mellitus without complications: Secondary | ICD-10-CM | POA: Diagnosis not present

## 2022-04-02 DIAGNOSIS — I480 Paroxysmal atrial fibrillation: Secondary | ICD-10-CM | POA: Diagnosis not present

## 2022-04-02 DIAGNOSIS — Z1231 Encounter for screening mammogram for malignant neoplasm of breast: Secondary | ICD-10-CM | POA: Diagnosis not present

## 2022-04-02 DIAGNOSIS — Z951 Presence of aortocoronary bypass graft: Secondary | ICD-10-CM | POA: Diagnosis not present

## 2022-04-02 DIAGNOSIS — E079 Disorder of thyroid, unspecified: Secondary | ICD-10-CM | POA: Diagnosis not present

## 2022-04-02 DIAGNOSIS — I10 Essential (primary) hypertension: Secondary | ICD-10-CM | POA: Diagnosis not present

## 2022-04-02 DIAGNOSIS — G894 Chronic pain syndrome: Secondary | ICD-10-CM | POA: Diagnosis not present

## 2022-06-04 ENCOUNTER — Telehealth: Payer: Self-pay | Admitting: *Deleted

## 2022-06-04 NOTE — Chronic Care Management (AMB) (Signed)
  Care Coordination   Note   06/04/2022 Name: Sheri [name] MRN: [id number] DOB: May 26, 1951  Sheri [name] is a 71 y.o. year old female who sees Sheri Ada, MD for primary care. I reached out to Sheri [name] by phone today to offer care coordination services.  [name] was given information about Care Coordination services today including:   The Care Coordination services include support from the care team which includes your Nurse Coordinator, Clinical Social Worker, or Pharmacist.  The Care Coordination team is here to help remove barriers to the health concerns and goals most important to you. Care Coordination services are voluntary, and the patient may decline or stop services at any time by request to their care team member.   Care Coordination Consent Status: Patient did not agree to participate in care coordination services at this time.  Encounter Outcome:  Pt. Refused  [place]  Direct Dial: [phone]

## 2023-11-06 ENCOUNTER — Encounter: Payer: Self-pay | Admitting: Nurse Practitioner
